# Patient Record
Sex: Female | Born: 1960 | Race: White | Hispanic: No | Smoking: Current every day smoker
Health system: Southern US, Community
[De-identification: ages and names within clinical notes are randomized; demographics above are authoritative.]

## PROBLEM LIST (undated history)

## (undated) DIAGNOSIS — M1990 Unspecified osteoarthritis, unspecified site: Secondary | ICD-10-CM

## (undated) DIAGNOSIS — R011 Cardiac murmur, unspecified: Secondary | ICD-10-CM

## (undated) DIAGNOSIS — I10 Essential (primary) hypertension: Secondary | ICD-10-CM

## (undated) DIAGNOSIS — N809 Endometriosis, unspecified: Secondary | ICD-10-CM

## (undated) HISTORY — PX: DIAGNOSTIC LAPAROSCOPY: SUR761

## (undated) HISTORY — DX: Endometriosis, unspecified: N80.9

## (undated) HISTORY — DX: Cardiac murmur, unspecified: R01.1

---

## 2001-07-13 ENCOUNTER — Ambulatory Visit (HOSPITAL_COMMUNITY): Admission: RE | Admit: 2001-07-13 | Discharge: 2001-07-13 | Payer: Self-pay | Admitting: Obstetrics and Gynecology

## 2001-07-13 ENCOUNTER — Encounter: Payer: Self-pay | Admitting: Obstetrics and Gynecology

## 2003-05-20 ENCOUNTER — Other Ambulatory Visit: Admission: RE | Admit: 2003-05-20 | Discharge: 2003-05-20 | Payer: Self-pay | Admitting: Obstetrics and Gynecology

## 2003-05-31 ENCOUNTER — Ambulatory Visit (HOSPITAL_COMMUNITY): Admission: RE | Admit: 2003-05-31 | Discharge: 2003-05-31 | Payer: Self-pay | Admitting: Obstetrics and Gynecology

## 2003-05-31 ENCOUNTER — Encounter: Payer: Self-pay | Admitting: Obstetrics and Gynecology

## 2004-06-09 ENCOUNTER — Ambulatory Visit (HOSPITAL_COMMUNITY): Admission: RE | Admit: 2004-06-09 | Discharge: 2004-06-09 | Payer: Self-pay | Admitting: Obstetrics and Gynecology

## 2008-09-13 ENCOUNTER — Other Ambulatory Visit: Admission: RE | Admit: 2008-09-13 | Discharge: 2008-09-13 | Payer: Self-pay | Admitting: Obstetrics & Gynecology

## 2008-09-20 ENCOUNTER — Ambulatory Visit (HOSPITAL_COMMUNITY): Admission: RE | Admit: 2008-09-20 | Discharge: 2008-09-20 | Payer: Self-pay | Admitting: Obstetrics & Gynecology

## 2011-05-14 NOTE — Op Note (Signed)
[facility]  Patient:    Angela Lin, Angela Lin                       MRN: [id number] Proc. Date: 07/13/01 Adm. Date:  [id number] Attending:  Sharon Mt                           Operative Report  PREOPERATIVE DIAGNOSES:  Dyspareunia, dysmenorrhea, and endometriosis suspected.  POSTOPERATIVE DIAGNOSES:  Dyspareunia, dysmenorrhea, endometriosis, pelvic adhesive disease, and right hydrosalpinx.  PROCEDURE PERFORMED:  Diagnostic laparoscopy with lysis of pelvic adhesions, laser vaporization of endometriosis and right salpingoneostomy.  SURGEON:  Daniel L. Eda Paschal, M.D.  ANESTHESIA:  General endotracheal.  INDICATIONS:  The patient is a 50 year old female who has presented to the office with dysmenorrhea and dyspareunia, and it was felt that she probably had endometriosis.  She enters the hospital now for diagnostic laparoscopy for both diagnosis and treatment.  FINDINGS:  At the time of laparoscopy, the patient had a mobile uterus.  She had areas of pigmented endometriosis and nonpigmented endometriosis on her uterosacral ligaments of about 2-3 cm.  Her left ovary was densely adherent to the broad ligament and where it was adherent there were areas of reddish endometriosis.  The patients left fallopian tube was slightly dilated, but it did have normal fimbria once it was freed up, and the tube was also patent when indigo carmine was introduced.  The right ovary had some superficial endometriosis, but no endometriomas, normal were there any endometriomas of the left ovary.  The patient had a very large right hydrosalpinx, however, when it was lasered especially over the areas that endometriosis near the distal portion, all tubal fluid including some endometriotic fluid drained and she did have some fimbria present on the right side.  DESCRIPTION OF PROCEDURE:  After adequate general endotracheal anesthesia, the patient was placed in the  dorsolithotomy position, prepped, and draped in the usual sterile manner, she was given IV Cefotetan.  Her bladder was emptied with a Roxan Hockey.  A Hulka catheter was inserted into the uterus.  A pneumoperitoneum was created with a Veress needle subumbilically, and a 10 mm trocar was placed.  Through that, the operating laparoscope was placed attached to the camera.  The pelvis was visualized and was described above. Pictures were taken for documentation.  A second 5 mm port was placed suprapubic through which an irrigator aspirator and several graspers were placed during the procedure.  First pelvic adhesive disease was lysed, both by blunt dissection with the accessory instruments and with the neodyminum laser. The neodymium laser was set at 12 watts of power.  A GR6 tip was placed over the bare tip.  Both adnexa could be completely freed up.  All the endometriosis described above could be laser vaporized.  Following this, the right tube was opened.  It was opened mostly to get rid of the endometriosis, which resolved the distal portion.  All the endometriosis was destroyed and the tube was entered.  Endometriotic fluid drained.  Once it was opened, there were some fimbria present.  Indigo carmine was introduced.  It did spill from the left tube.  It did not spill from the tube that had been opened, however, there was no proximal filling on the right and we could not ascertain for sure whether this was just preferential filling of the left tube or that the patient also had proximal obstruction on the right.  At the termination of the procedure, there was no endometriosis left.  Both adnexa were free.  All cul-de-sac fluid was removed.  Pneumoperitoneum was evacuated.  The subumbilical incision was closed with a 0 Vicryl through the fascia and the skin incisions were closed with 4-0 Monocryl.  Estimated blood loss for the entire procedure was less than 100 cc with none replaced.  The  patient tolerated the procedure well and left the operating room in satisfactory condition. DD:  07/13/01 TD:  07/14/01 Job: [id number]

## 2014-09-17 ENCOUNTER — Ambulatory Visit: Payer: BC Managed Care – PPO | Attending: Family Medicine | Admitting: Physical Therapy

## 2014-09-17 DIAGNOSIS — R5381 Other malaise: Secondary | ICD-10-CM | POA: Diagnosis not present

## 2014-09-17 DIAGNOSIS — M545 Low back pain, unspecified: Secondary | ICD-10-CM | POA: Insufficient documentation

## 2014-09-17 DIAGNOSIS — IMO0001 Reserved for inherently not codable concepts without codable children: Secondary | ICD-10-CM | POA: Diagnosis present

## 2014-09-17 DIAGNOSIS — M25559 Pain in unspecified hip: Secondary | ICD-10-CM | POA: Diagnosis not present

## 2014-09-17 DIAGNOSIS — M25569 Pain in unspecified knee: Secondary | ICD-10-CM | POA: Diagnosis not present

## 2014-09-25 ENCOUNTER — Ambulatory Visit: Payer: BC Managed Care – PPO | Admitting: Physical Therapy

## 2016-04-07 ENCOUNTER — Ambulatory Visit: Payer: BLUE CROSS/BLUE SHIELD | Attending: Physician Assistant

## 2016-04-07 DIAGNOSIS — M25651 Stiffness of right hip, not elsewhere classified: Secondary | ICD-10-CM | POA: Diagnosis present

## 2016-04-07 DIAGNOSIS — M25551 Pain in right hip: Secondary | ICD-10-CM | POA: Diagnosis present

## 2016-04-07 DIAGNOSIS — R262 Difficulty in walking, not elsewhere classified: Secondary | ICD-10-CM | POA: Diagnosis present

## 2016-04-07 DIAGNOSIS — R2689 Other abnormalities of gait and mobility: Secondary | ICD-10-CM | POA: Diagnosis present

## 2016-04-07 NOTE — Therapy (Signed)
[facility] [address], [place], [place], [address] Phone: [phone]   Fax:  [phone]  Physical Therapy Evaluation  Patient Details  Name: Angela Lin MRN: [id number] Date of Birth: 1961-01-29 Referring Provider: Suella Grove, PA  Encounter Date: 04/07/2016      PT End of Session - 04/07/16 0925    Visit Number 1   Date for PT Re-Evaluation 06/02/16   PT Start Time 0847   PT Stop Time 0926   PT Time Calculation (min) 39 min   Activity Tolerance Patient tolerated treatment well   Behavior During Therapy [facility] for tasks assessed/performed      History reviewed. No pertinent past medical history.  History reviewed. No pertinent past surgical history.  There were no vitals filed for this visit.       Subjective Assessment - 04/07/16 0853    Subjective Pt presents to PT with compliants of Rt hip pain (2 year history).  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  No recent incident or injury.     Pertinent History Predizone- 6 days with mild relief   Limitations Standing;Walking   How long can you stand comfortably? Stands Lt>Rt weightbearing.     How long can you walk comfortably? antalgic gait with gait.  <5 minutes max    Diagnostic tests nothing recent.  Old imaging showed bone on bone per pt report in Rt hip.   Patient Stated Goals reduce hip pain, normalize gait, return to part time job   Currently in Pain? Yes   Pain Score 7    Pain Location Hip   Pain Orientation Right   Pain Descriptors / Indicators Shooting   Pain Type Acute pain   Pain Onset 1 to 4 weeks ago   Pain Frequency Constant   Aggravating Factors  walking, standing.   Pain Relieving Factors rest, not walking, Yvonne Kendall            [facility] PT Assessment - 04/07/16 0001    Assessment   Medical Diagnosis Rt  hip pain   Referring Provider Suella Grove, PA   Onset Date/Surgical Date 03/24/16   Next MD Visit none.  Will see Dr Ninfa Linden when scheduled   Prior Therapy 2 years ago   Precautions   Precautions None   Restrictions   Weight Bearing Restrictions No   Balance Screen   Has the patient fallen in the past 6 months No   Has the patient had a decrease in activity level because of a fear of falling?  No   Is the patient reluctant to leave their home because of a fear of falling?  No   Home Environment   Living Environment Private residence   Type of [place] to enter   Entrance Stairs-Number of Steps 2   [place] One level   Prior Function   Level of Independence Independent   Vocation Full time employment   Vocation Requirements desk work- Photographer, part time work on Saturdays delivering flowers   Cognition   Overall Cognitive Status Within Functional Limits for tasks assessed   Observation/Other Assessments   Focus on Therapeutic Outcomes (FOTO)  70% limitation   ROM / Strength   AROM / PROM / Strength AROM;PROM;Strength   AROM   Overall AROM  Deficits  Overall AROM Comments Bil hip AROM limited by 50% in all directions   PROM   Overall PROM  Deficits   Overall PROM Comments Rt and Lt hip PROM limited by 50% in all directions   Strength   Overall Strength Deficits   Overall Strength Comments 5/5 Lt hip and knee strength   Strength Assessment Site Hip;Knee   Right/Left Hip Right   Right Hip Flexion 4-/5   Right Hip External Rotation  4/5   Right Hip Internal Rotation 4/5   Right Hip ABduction 4-/5   Right/Left Knee Right   Right Knee Flexion 4/5   Right Knee Extension 4/5   Palpation   Palpation comment Pt with tension and active trigger points in Rt hip flexor, piriformis and glut max.  No tenderness in quadratus.  Pelivs is level   Ambulation/Gait   Ambulation/Gait Yes   Ambulation/Gait Assistance 6: Modified independent (Device/Increase  time)   Ambulation Distance (Feet) 100 Feet   Gait Pattern Step-through pattern;Decreased hip/knee flexion - right;Decreased stance time - right;Antalgic                           PT Education - 04/07/16 0921    Education provided Yes   Education Details hip flexibilty   Person(s) Educated Patient   Methods Explanation;Demonstration;Handout   Comprehension Verbalized understanding;Returned demonstration          PT Short Term Goals - 04/07/16 0937    PT SHORT TERM GOAL #1   Title be independent in initial HEP   Time 4   Period Weeks   Status New   PT SHORT TERM GOAL #2   Title report < or = to 4/10 Rt hip pain with walking   Time 4   Period Weeks   Status New   PT SHORT TERM GOAL #3   Title walk for 10 minutes without limitation due to Rt hip pain   Time 4   Period Weeks   Status New           PT Long Term Goals - 04/07/16 0845    PT LONG TERM GOAL #1   Title be independent in advanced HEP   Time 8   Period Weeks   Status New   PT LONG TERM GOAL #2   Title reduce FOTO to < or = to 43% limitation   Time 8   Period Weeks   Status New   PT LONG TERM GOAL #3   Title demonstrate symmetry with ambulation on level surface   Time 8   Period Weeks   Status New   PT LONG TERM GOAL #4   Title report < or = to 3/10 Rt hip pain with standing and walking to allow for return to part time job   Time 8   Period Weeks   Status New   PT LONG TERM GOAL #5   Title walk for 30 minutes without limitation without rest to improve community function   Time 8   Period Weeks   Status New               Plan - 04/07/16 [id number]    Clinical Impression Statement Pt presents to PT with 2 week history of Rt hip pain with incident.  Pt demonstrates antalgic gait, stiffness in bil hips, active trigger points in Rt hip flexors and gluteals, weakness in Rt hip and knee.  FOTO score is 70% limitation.  Pt  will beneift from skilled PT for dry needling, flexibility,  soft tissue work, gait training and strength advancment to reduce pain and allow for normalized gait pattern and tolerance for walking.   Rehab Potential Good   PT Frequency 2x / week   PT Duration 8 weeks   PT Treatment/Interventions ADLs/Self Care Home Management;Cryotherapy;Electrical Stimulation;Moist Heat;Therapeutic exercise;Therapeutic activities;Manual techniques;Functional mobility training;Gait training;Ultrasound;Neuromuscular re-education;Patient/family education;Taping;Dry needling;Passive range of motion   PT Next Visit Plan Dry needling, hip flexibility, manual, hip strength, gait training   Consulted and Agree with Plan of Care Patient      Patient will benefit from skilled therapeutic intervention in order to improve the following deficits and impairments:  Abnormal gait, Decreased range of motion, Difficulty walking, Pain, Decreased strength, Impaired flexibility, Decreased activity tolerance  Visit Diagnosis: Pain in right hip - Plan: PT plan of care cert/re-cert  Other abnormalities of gait and mobility - Plan: PT plan of care cert/re-cert  Difficulty in walking, not elsewhere classified - Plan: PT plan of care cert/re-cert  Stiffness of right hip, not elsewhere classified - Plan: PT plan of care cert/re-cert     Problem List There are no active problems to display for this patient.  Sigurd Sos, PT 04/07/2016 10:14 AM  [facility] [address] [place], [place], [address] Phone: [phone]   Fax:  [phone]  Name: Angela Lin MRN: [id number] Date of Birth: 1961-08-15

## 2016-04-07 NOTE — Patient Instructions (Addendum)
Hold all stretches for 20 seconds and do 3 on each side.  3x/day.   Hip Stretch  Put right ankle over left knee. Let right knee fall downward, but keep ankle in place. Feel the stretch in hip. May push down gently with hand to feel stretch. Hold ____ seconds while counting out loud. Repeat with other leg. Repeat ____ times. Do ____ sessions per day.  Stretching: Piriformis (Supine)  Pull right knee toward opposite shoulder. Hold ____ seconds. Relax. Repeat ____ times per set. Do ____ sets per session. Do ____ sessions per day.  Knee to Chest    Lying supine, bend involved knee to chest ___ times. Repeat with other leg. Do ___ times per day.  Copyright  VHI. All rights reserved.   HIP: Hamstrings - Short Sitting    Rest leg on raised surface. Keep knee straight. Lift chest. Hold ___ seconds. ___ reps per set, ___ sets per day, ___ days per week  Copyright  VHI. All rights reserved.  Hip Flexor Stretch    Lying on back near edge of bed, bend one leg, foot flat. Hang other leg over edge, relaxed, thigh resting entirely on bed for ____ minutes. Repeat ____ times. Do ____ sessions per day. Advanced Exercise: Bend knee back keeping thigh in contact with bed.  http://gt2.exer.us/347   Copyright  VHI. All rights reserved.   Kevin [address] [place], [place] [address] Phone # [phone] Fax [phone]

## 2016-04-12 ENCOUNTER — Ambulatory Visit: Payer: BLUE CROSS/BLUE SHIELD

## 2016-04-12 DIAGNOSIS — M25651 Stiffness of right hip, not elsewhere classified: Secondary | ICD-10-CM

## 2016-04-12 DIAGNOSIS — R2689 Other abnormalities of gait and mobility: Secondary | ICD-10-CM

## 2016-04-12 DIAGNOSIS — R262 Difficulty in walking, not elsewhere classified: Secondary | ICD-10-CM

## 2016-04-12 DIAGNOSIS — M25551 Pain in right hip: Secondary | ICD-10-CM | POA: Diagnosis not present

## 2016-04-12 NOTE — Patient Instructions (Signed)

## 2016-04-12 NOTE — Therapy (Signed)
[facility] [address], [place], [place], [address] Phone: [phone]   Fax:  [phone]  Physical Therapy Treatment  Patient Details  Name: Angela Lin MRN: [id number] Date of Birth: [birth year] Referring Provider: Suella Grove, PA  Encounter Date: 04/12/2016      PT End of Session - 04/12/16 0841    Visit Number 2   Date for PT Re-Evaluation 06/02/16   PT Start Time 0800   PT Stop Time 0855   PT Time Calculation (min) 55 min   Activity Tolerance Patient tolerated treatment well   Behavior During Therapy [facility] for tasks assessed/performed      History reviewed. No pertinent past medical history.  History reviewed. No pertinent past surgical history.  There were no vitals filed for this visit.      Subjective Assessment - 04/12/16 0802    Subjective Pt has beend doing stretches.  Rt hip feels a little bit better.     Currently in Pain? Yes   Pain Score 3    Pain Location Hip   Pain Orientation Right   Pain Descriptors / Indicators Shooting   Pain Type Acute pain   Pain Onset 1 to 4 weeks ago   Pain Frequency Constant   Aggravating Factors  walking, standing   Pain Relieving Factors rest, not walking, Yvonne Kendall                         [facility] Adult PT Treatment/Exercise - 04/12/16 0001    Exercises   Exercises Knee/Hip;Lumbar   Lumbar Exercises: Stretches   Active Hamstring Stretch 3 reps;20 seconds   Single Knee to Chest Stretch 3 reps;20 seconds   Single Knee to Chest Stretch Limitations diagonal knee to chest 3x20 seconds   Piriformis Stretch 3 reps;20 seconds   Modalities   Modalities Moist Heat   Moist Heat Therapy   Number Minutes Moist Heat 15 Minutes   Moist Heat Location Hip  Rt in sidelying   Manual Therapy   Manual Therapy Soft tissue mobilization;Myofascial release;Passive ROM   Manual therapy comments PROM into Rt hip flexion, IR and ER and soft  tissue release to Rt piriformis          Trigger Point Dry Needling - 04/12/16 0806    Consent Given? Yes   Education Handout Provided Yes   Muscles Treated Lower Body Quadriceps;Piriformis;Gluteus maximus   Gluteus Maximus Response Twitch response elicited;Palpable increased muscle length   Piriformis Response Twitch response elicited;Palpable increased muscle length   Quadriceps Response Palpable increased muscle length              PT Education - 04/12/16 0826    Education provided Yes   Education Details DN information   Person(s) Educated Patient   Methods Explanation;Handout   Comprehension Verbalized understanding;Returned demonstration          PT Short Term Goals - 04/12/16 0800    PT SHORT TERM GOAL #1   Title be independent in initial HEP   Time 4   Period Weeks   Status On-going   PT SHORT TERM GOAL #2   Title report < or = to 4/10 Rt hip pain with walking   Time 4   Period Weeks   Status On-going   PT SHORT TERM GOAL #3   Title walk for 10 minutes without limitation due to Rt hip pain   Time 4   Period Weeks  Status On-going           PT Long Term Goals - 04/07/16 0845    PT LONG TERM GOAL #1   Title be independent in advanced HEP   Time 8   Period Weeks   Status New   PT LONG TERM GOAL #2   Title reduce FOTO to < or = to 43% limitation   Time 8   Period Weeks   Status New   PT LONG TERM GOAL #3   Title demonstrate symmetry with ambulation on level surface   Time 8   Period Weeks   Status New   PT LONG TERM GOAL #4   Title report < or = to 3/10 Rt hip pain with standing and walking to allow for return to part time job   Time 8   Period Weeks   Status New   PT LONG TERM GOAL #5   Title walk for 30 minutes without limitation without rest to improve community function   Time 8   Period Weeks   Status New               Plan - 04/12/16 0802    Clinical Impression Statement Pt with continued antalgic gait and Rt hip  pain that limits standing activity.  Pt has been doing exercises for flexibility.  Pt with active trigger points in Rt hip flexors and gluteals and weakness in Rt hip and knee.  Pt responded well to dry needling with incresaed tissue length after session.  Pt will benefit from skilled PT for strength, flexibility and manual and modalities.     Rehab Potential Good   PT Frequency 2x / week   PT Duration 8 weeks   PT Treatment/Interventions ADLs/Self Care Home Management;Cryotherapy;Electrical Stimulation;Moist Heat;Therapeutic exercise;Therapeutic activities;Manual techniques;Functional mobility training;Gait training;Ultrasound;Neuromuscular re-education;Patient/family education;Taping;Dry needling;Passive range of motion   PT Next Visit Plan assess response to Dry needling, hip flexibility, manual, hip strength, gait training   Consulted and Agree with Plan of Care Patient      Patient will benefit from skilled therapeutic intervention in order to improve the following deficits and impairments:  Abnormal gait, Decreased range of motion, Difficulty walking, Pain, Decreased strength, Impaired flexibility, Decreased activity tolerance  Visit Diagnosis: Pain in right hip  Other abnormalities of gait and mobility  Difficulty in walking, not elsewhere classified  Stiffness of right hip, not elsewhere classified     Problem List There are no active problems to display for this patient.  Sigurd Sos, PT 04/12/2016 8:46 AM  [facility] [address], [place], [place], [address] Phone: [phone]   Fax:  [phone]  Name: NATORIA WEATHERLEY MRN: [id number] Date of Birth: 1961-05-28

## 2016-04-14 ENCOUNTER — Encounter: Payer: Self-pay | Admitting: Physical Therapy

## 2016-04-14 ENCOUNTER — Ambulatory Visit: Payer: BLUE CROSS/BLUE SHIELD | Admitting: Physical Therapy

## 2016-04-14 DIAGNOSIS — R262 Difficulty in walking, not elsewhere classified: Secondary | ICD-10-CM

## 2016-04-14 DIAGNOSIS — R2689 Other abnormalities of gait and mobility: Secondary | ICD-10-CM

## 2016-04-14 DIAGNOSIS — M25651 Stiffness of right hip, not elsewhere classified: Secondary | ICD-10-CM

## 2016-04-14 DIAGNOSIS — M25551 Pain in right hip: Secondary | ICD-10-CM | POA: Diagnosis not present

## 2016-04-14 NOTE — Therapy (Signed)
[facility] [address], [place], [place], [address] Phone: [phone]   Fax:  [phone]  Physical Therapy Treatment  Patient Details  Name: Angela Lin MRN: [id number] Date of Birth: 1961/10/13 Referring Provider: Suella Grove, PA  Encounter Date: 04/14/2016      PT End of Session - 04/14/16 0816    Visit Number 3   Date for PT Re-Evaluation 06/02/16   PT Start Time 0800   PT Stop Time 0856   PT Time Calculation (min) 56 min   Activity Tolerance Patient tolerated treatment well   Behavior During Therapy [facility] for tasks assessed/performed      History reviewed. No pertinent past medical history.  History reviewed. No pertinent past surgical history.  There were no vitals filed for this visit.      Subjective Assessment - 04/14/16 0810    Subjective Pt rates her pain as 5/10 this am, legs are fatique with her walk yesterday night. Pt with limbing gait due to pain in Rt hip    Pertinent History Predizone- 6 days with mild relief   Limitations Standing;Walking   How long can you stand comfortably? Stands Lt>Rt weightbearing.     How long can you walk comfortably? antalgic gait with gait.  <5 minutes max    Diagnostic tests nothing recent.  Old imaging showed bone on bone per pt report in Rt hip.   Patient Stated Goals reduce hip pain, normalize gait, return to part time job   Currently in Pain? Yes   Pain Score 5    Pain Location Hip   Pain Orientation Right   Pain Descriptors / Indicators Tightness   Pain Type Acute pain   Pain Onset 1 to 4 weeks ago   Pain Frequency Constant   Aggravating Factors  walking, standing   Pain Relieving Factors rest, not walking, [place], [employer]   Multiple Pain Sites No                         [facility] Adult PT Treatment/Exercise - 04/14/16 0001    Exercises   Exercises Knee/Hip;Lumbar   Lumbar Exercises: Stretches   Active Hamstring Stretch 3  reps;20 seconds   Single Knee to Chest Stretch 3 reps;20 seconds   Single Knee to Chest Stretch Limitations diagonal knee to chest 3x20 seconds   Piriformis Stretch 3 reps;20 seconds   Modalities   Modalities Moist Heat   Moist Heat Therapy   Number Minutes Moist Heat 15 Minutes   Moist Heat Location Hip  Rt in left sidelying   Manual Therapy   Manual Therapy Soft tissue mobilization;Myofascial release;Passive ROM;Joint mobilization   Manual therapy comments PROM into Rt hip flexion, IR and ER and soft tissue release to Rt piriformis   Joint Mobilization caudal & lat glide grade 3 Rt hip                  PT Short Term Goals - 04/14/16 [id number]    PT SHORT TERM GOAL #1   Title be independent in initial HEP   Time 4   Period Weeks   Status On-going   PT SHORT TERM GOAL #2   Title report < or = to 4/10 Rt hip pain with walking   Time 4   Period Weeks   Status On-going   PT SHORT TERM GOAL #3   Title walk for 10 minutes without limitation due to Rt hip pain  Time 4   Period Weeks   Status On-going           PT Long Term Goals - 04/07/16 0845    PT LONG TERM GOAL #1   Title be independent in advanced HEP   Time 8   Period Weeks   Status New   PT LONG TERM GOAL #2   Title reduce FOTO to < or = to 43% limitation   Time 8   Period Weeks   Status New   PT LONG TERM GOAL #3   Title demonstrate symmetry with ambulation on level surface   Time 8   Period Weeks   Status New   PT LONG TERM GOAL #4   Title report < or = to 3/10 Rt hip pain with standing and walking to allow for return to part time job   Time 8   Period Weeks   Status New   PT LONG TERM GOAL #5   Title walk for 30 minutes without limitation without rest to improve community function   Time 8   Period Weeks   Status New               Plan - 04/14/16 [id number]    Clinical Impression Statement Pt with antalgic gait due to Rt hip pain. Pt with palpaple tenderness and TP iliopsoas, gluteus and  piriiformis in Rt hip. Pt with overall weakness in Rt gluteal and leg. Pt will continue to benefit from skilled PT for strength, flexibility, manual and modalities      Rehab Potential Good   PT Frequency 2x / week   PT Duration 8 weeks   PT Treatment/Interventions ADLs/Self Care Home Management;Cryotherapy;Electrical Stimulation;Moist Heat;Therapeutic exercise;Therapeutic activities;Manual techniques;Functional mobility training;Gait training;Ultrasound;Neuromuscular re-education;Patient/family education;Taping;Dry needling;Passive range of motion   PT Next Visit Plan Dry needeling, hip flexibility, manual, hip strength and modalities   Consulted and Agree with Plan of Care Patient      Patient will benefit from skilled therapeutic intervention in order to improve the following deficits and impairments:  Abnormal gait, Decreased range of motion, Difficulty walking, Pain, Decreased strength, Impaired flexibility, Decreased activity tolerance  Visit Diagnosis: Pain in right hip  Other abnormalities of gait and mobility  Difficulty in walking, not elsewhere classified  Stiffness of right hip, not elsewhere classified     Problem List There are no active problems to display for this patient.   Dorwin Fitzhenry Naumann-Houegnifio, PTA 04/14/2016 9:00 AM  [facility] [address] [place], [place], [address] Phone: [phone]   Fax:  [phone]  Name: Angela Lin MRN: [id number] Date of Birth: 10/24/1961

## 2016-04-20 ENCOUNTER — Ambulatory Visit: Payer: BLUE CROSS/BLUE SHIELD

## 2016-04-20 DIAGNOSIS — R262 Difficulty in walking, not elsewhere classified: Secondary | ICD-10-CM

## 2016-04-20 DIAGNOSIS — R2689 Other abnormalities of gait and mobility: Secondary | ICD-10-CM

## 2016-04-20 DIAGNOSIS — M25651 Stiffness of right hip, not elsewhere classified: Secondary | ICD-10-CM

## 2016-04-20 DIAGNOSIS — M25551 Pain in right hip: Secondary | ICD-10-CM | POA: Diagnosis not present

## 2016-04-20 NOTE — Therapy (Signed)
[facility] [address] [place], [place], [address] Phone: [phone]   Fax:  [phone]  Physical Therapy Treatment  Patient Details  Name: Angela Lin MRN: [id number] Date of Birth: 01/05/1961 Referring Provider: Suella Grove, PA  Encounter Date: 04/20/2016      PT End of Session - 04/20/16 0759    Visit Number 4   Date for PT Re-Evaluation 06/02/16   PT Start Time 0725   PT Stop Time 0815   PT Time Calculation (min) 50 min   Activity Tolerance Patient tolerated treatment well   Behavior During Therapy [facility] for tasks assessed/performed      No past medical history on file.  No past surgical history on file.  There were no vitals filed for this visit.      Subjective Assessment - 04/20/16 0728    Subjective Pt has stopped taking the muscle relaxers.  Gait remains antalgic.     Patient Stated Goals reduce hip pain, normalize gait, return to part time job   Pain Score 5    Pain Location Hip   Pain Orientation Right   Pain Descriptors / Indicators Tightness   Pain Type Acute pain   Pain Onset 1 to 4 weeks ago   Pain Frequency Constant   Aggravating Factors  walking, standing   Pain Relieving Factors rest, not walking, [employer]                         [facility] Adult PT Treatment/Exercise - 04/20/16 0001    Modalities   Modalities Moist Heat   Moist Heat Therapy   Number Minutes Moist Heat 15 Minutes   Moist Heat Location Hip  Rt in left sidelying   Manual Therapy   Manual Therapy Soft tissue mobilization;Myofascial release;Passive ROM;Joint mobilization   Manual therapy comments PROM into Rt hip flexion, IR and ER and soft tissue release to Rt piriformis   Myofascial Release trigger point release to Rt quads and gluteals with deep tissue elongation          Trigger Point Dry Needling - 04/20/16 0757    Consent Given? Yes   Muscles Treated Lower Body Gluteus  maximus;Piriformis;Quadriceps   Gluteus Maximus Response Twitch response elicited;Palpable increased muscle length   Piriformis Response Twitch response elicited;Palpable increased muscle length   Quadriceps Response Twitch response elicited;Palpable increased muscle length                PT Short Term Goals - 04/20/16 0729    PT SHORT TERM GOAL #1   Title be independent in initial HEP   Status Achieved   PT SHORT TERM GOAL #2   Title report < or = to 4/10 Rt hip pain with walking   Time 4   Period Weeks   Status On-going   PT SHORT TERM GOAL #3   Title walk for 10 minutes without limitation due to Rt hip pain   Status Achieved           PT Long Term Goals - 04/07/16 0845    PT LONG TERM GOAL #1   Title be independent in advanced HEP   Time 8   Period Weeks   Status New   PT LONG TERM GOAL #2   Title reduce FOTO to < or = to 43% limitation   Time 8   Period Weeks   Status New   PT LONG TERM GOAL #3  Title demonstrate symmetry with ambulation on level surface   Time 8   Period Weeks   Status New   PT LONG TERM GOAL #4   Title report < or = to 3/10 Rt hip pain with standing and walking to allow for return to part time job   Time 8   Period Weeks   Status New   PT LONG TERM GOAL #5   Title walk for 30 minutes without limitation without rest to improve community function   Time 8   Period Weeks   Status New               Plan - 04/20/16 0730    Clinical Impression Statement Pt now able to walk for 15 minutes with her dog but gait remains antalgic.  Pt with active trigger points in Rt gluteals and had increaed tissue length and reduced trigger points after treatment today.  Emphasis on flexibility today and manual to imrpove tissue length.  Pt will continue to benefit from skilled PTfor flexibility, gait training and manual to improve Rt hip flexibilty and reduce trigger points.     Rehab Potential Good   PT Frequency 2x / week   PT Duration 8  weeks   PT Treatment/Interventions ADLs/Self Care Home Management;Cryotherapy;Electrical Stimulation;Moist Heat;Therapeutic exercise;Therapeutic activities;Manual techniques;Functional mobility training;Gait training;Ultrasound;Neuromuscular re-education;Patient/family education;Taping;Dry needling;Passive range of motion   PT Next Visit Plan assess response to DN2, manual to Rt hip, gait training, modalities and soft tissue work   [employer] and Agree with Plan of Care Patient      Patient will benefit from skilled therapeutic intervention in order to improve the following deficits and impairments:  Abnormal gait, Decreased range of motion, Difficulty walking, Pain, Decreased strength, Impaired flexibility, Decreased activity tolerance  Visit Diagnosis: Pain in right hip  Other abnormalities of gait and mobility  Difficulty in walking, not elsewhere classified  Stiffness of right hip, not elsewhere classified     Problem List There are no active problems to display for this patient.   Sigurd Sos, PT 04/20/2016 8:00 AM  [facility] [address] [place], [place], [address] Phone: [phone]   Fax:  [phone]  Name: Angela Lin MRN: [id number] Date of Birth: 1961-09-12

## 2016-04-22 ENCOUNTER — Ambulatory Visit: Payer: BLUE CROSS/BLUE SHIELD

## 2016-04-22 DIAGNOSIS — R2689 Other abnormalities of gait and mobility: Secondary | ICD-10-CM

## 2016-04-22 DIAGNOSIS — M25551 Pain in right hip: Secondary | ICD-10-CM | POA: Diagnosis not present

## 2016-04-22 DIAGNOSIS — M25651 Stiffness of right hip, not elsewhere classified: Secondary | ICD-10-CM

## 2016-04-22 DIAGNOSIS — R262 Difficulty in walking, not elsewhere classified: Secondary | ICD-10-CM

## 2016-04-22 NOTE — Therapy (Signed)
[facility] [address], [place], [place], [address] Phone: [phone]   Fax:  [phone]  Physical Therapy Treatment  Patient Details  Name: Angela Lin MRN: [id number] Date of Birth: 13-Apr-1961 Referring Provider: Suella Grove, PA  Encounter Date: 04/22/2016      PT End of Session - 04/22/16 0803    Visit Number 5   Date for PT Re-Evaluation 06/02/16   PT Start Time 0738  Pt late for appt   PT Stop Time 0823   PT Time Calculation (min) 45 min   Activity Tolerance Patient tolerated treatment well   Behavior During Therapy [facility] for tasks assessed/performed      History reviewed. No pertinent past medical history.  History reviewed. No pertinent past surgical history.  There were no vitals filed for this visit.      Subjective Assessment - 04/22/16 0739    Subjective Pt reports increased pain due to yardwork yesterday.  Gait is antalgic.     Currently in Pain? Yes   Pain Score 5    Pain Location Hip   Pain Orientation Right   Pain Descriptors / Indicators Tightness   Pain Type Acute pain   Pain Onset 1 to 4 weeks ago   Pain Frequency Constant   Aggravating Factors  walking, standing   Pain Relieving Factors rest, not walking, [employer]                         [facility] Adult PT Treatment/Exercise - 04/22/16 0001    Ambulation/Gait   Ambulation/Gait Yes   Ambulation/Gait Assistance 6: Modified independent (Device/Increase time)   Ambulation Distance (Feet) --  4x50 feet   Assistive device Straight cane   Gait Pattern Step-through pattern;Decreased step length - right;Decreased stance time - left   Gait Comments Gait training using mirror for visual feedback.  Emphasis on equal step length, time in stance and symmetry to normalize gait pattern   Lumbar Exercises: Stretches   Active Hamstring Stretch 3 reps;20 seconds  using step   Single Knee to Chest Stretch 3 reps;20  seconds   Single Knee to Chest Stretch Limitations diagonal knee to chest 3x20 seconds   Piriformis Stretch 3 reps;20 seconds  seated figure 4   Lumbar Exercises: Aerobic   Stationary Bike Level 2x 5 minutes   Knee/Hip Exercises: Standing   Rebounder weight shifting 3 ways with emphasis on Rt weightbearing x 1 minute each   Modalities   Modalities Moist Heat   Moist Heat Therapy   Number Minutes Moist Heat 15 Minutes   Moist Heat Location Hip  Lt sidelying                  PT Short Term Goals - 04/20/16 0729    PT SHORT TERM GOAL #1   Title be independent in initial HEP   Status Achieved   PT SHORT TERM GOAL #2   Title report < or = to 4/10 Rt hip pain with walking   Time 4   Period Weeks   Status On-going   PT SHORT TERM GOAL #3   Title walk for 10 minutes without limitation due to Rt hip pain   Status Achieved           PT Long Term Goals - 04/07/16 0845    PT LONG TERM GOAL #1   Title be independent in advanced HEP   Time 8   Period Weeks  Status New   PT LONG TERM GOAL #2   Title reduce FOTO to < or = to 43% limitation   Time 8   Period Weeks   Status New   PT LONG TERM GOAL #3   Title demonstrate symmetry with ambulation on level surface   Time 8   Period Weeks   Status New   PT LONG TERM GOAL #4   Title report < or = to 3/10 Rt hip pain with standing and walking to allow for return to part time job   Time 8   Period Weeks   Status New   PT LONG TERM GOAL #5   Title walk for 30 minutes without limitation without rest to improve community function   Time 8   Period Weeks   Status New               Plan - 04/22/16 0741    Clinical Impression Statement Pt is able to walk for 15 minutes with her dog yet gait remains antalgic.  Pt with active trigger points in Rt gluteals and this is reduced in since since evaluation.  Emphasis on flexibility and symmetry with gait today.  Pt will continue to benefit from skilled PT for flexibility,  gait training and manual to imrpove Rt hip flexibility and reduce trigger points.     Rehab Potential Good   PT Frequency 2x / week   PT Duration 8 weeks   PT Treatment/Interventions ADLs/Self Care Home Management;Cryotherapy;Electrical Stimulation;Moist Heat;Therapeutic exercise;Therapeutic activities;Manual techniques;Functional mobility training;Gait training;Ultrasound;Neuromuscular re-education;Patient/family education;Taping;Dry needling;Passive range of motion   PT Next Visit Plan DN to Rt gluteals and quads, flexibilty, gait training, manual therapy   Consulted and Agree with Plan of Care Patient      Patient will benefit from skilled therapeutic intervention in order to improve the following deficits and impairments:  Abnormal gait, Decreased range of motion, Difficulty walking, Pain, Decreased strength, Impaired flexibility, Decreased activity tolerance  Visit Diagnosis: Pain in right hip  Other abnormalities of gait and mobility  Difficulty in walking, not elsewhere classified  Stiffness of right hip, not elsewhere classified     Problem List There are no active problems to display for this patient.   Sigurd Sos, PT 04/22/2016 8:08 AM  [facility] [address], [place], [place], [address] Phone: [phone]   Fax:  [phone]  Name: MCKENLY BENCOMO MRN: [id number] Date of Birth: [birth year]

## 2016-04-27 ENCOUNTER — Ambulatory Visit: Payer: BLUE CROSS/BLUE SHIELD | Attending: Physician Assistant

## 2016-04-27 DIAGNOSIS — M25551 Pain in right hip: Secondary | ICD-10-CM | POA: Insufficient documentation

## 2016-04-27 DIAGNOSIS — M25651 Stiffness of right hip, not elsewhere classified: Secondary | ICD-10-CM | POA: Insufficient documentation

## 2016-04-27 DIAGNOSIS — R2689 Other abnormalities of gait and mobility: Secondary | ICD-10-CM | POA: Insufficient documentation

## 2016-04-27 NOTE — Therapy (Signed)
[facility] [address], [place], [place], [address] Phone: [phone]   Fax:  [phone]  Physical Therapy Treatment  Patient Details  Name: Angela Lin MRN: [id number] Date of Birth: 04-28-1961 Referring Provider: Suella Grove, PA  Encounter Date: 04/27/2016      PT End of Session - 04/27/16 0813    Visit Number 6   Date for PT Re-Evaluation 06/02/16   PT Start Time 0730   PT Stop Time 0830   PT Time Calculation (min) 60 min   Activity Tolerance Patient tolerated treatment well   Behavior During Therapy [facility] for tasks assessed/performed      History reviewed. No pertinent past medical history.  History reviewed. No pertinent past surgical history.  There were no vitals filed for this visit.      Subjective Assessment - 04/27/16 0733    Subjective Rt hip is feeling better.  Pt with antalgic gait-didn't buy a cane   Currently in Pain? Yes   Pain Score 2    Pain Location Hip   Pain Orientation Right   Pain Descriptors / Indicators Tightness   Pain Type Acute pain   Pain Onset 1 to 4 weeks ago   Pain Frequency Constant   Aggravating Factors  walking, standing   Pain Relieving Factors rest, not walking,                         [facility] Adult PT Treatment/Exercise - 04/27/16 0001    Ambulation/Gait   Ambulation/Gait Yes   Ambulation/Gait Assistance 6: Modified independent (Device/Increase time)   Ambulation Distance (Feet) --  4x50 feet   Assistive device Straight cane   Gait Pattern Step-through pattern;Decreased step length - right;Decreased stance time - left   Gait Comments Gait training using mirror for visual feedback.  Emphasis on equal step length, time in stance and symmetry to normalize gait pattern   Exercises   Exercises Knee/Hip   Lumbar Exercises: Stretches   Single Knee to Chest Stretch 3 reps;20 seconds   Single Knee to Chest Stretch Limitations diagonal knee  to chest 3x20 seconds   Lumbar Exercises: Aerobic   Stationary Bike Level 2x 5 minutes   Knee/Hip Exercises: Standing   Rebounder weight shifting 3 ways with emphasis on Rt weightbearing x 1 minute each   Modalities   Modalities Moist Heat   Moist Heat Therapy   Number Minutes Moist Heat 15 Minutes   Moist Heat Location Hip  Lt sidelying   Manual Therapy   Manual Therapy Soft tissue mobilization   Soft tissue mobilization soft tissue elongation and trigger point release to Rt gluteals          Trigger Point Dry Needling - 04/27/16 0735    Consent Given? Yes   Muscles Treated Lower Body Gluteus maximus;Piriformis;Gluteus minimus   Gluteus Minimus Response Twitch response elicited;Palpable increased muscle length   Piriformis Response Twitch response elicited;Palpable increased muscle length   Quadriceps Response Twitch response elicited;Palpable increased muscle length                PT Short Term Goals - 04/27/16 0735    PT SHORT TERM GOAL #2   Title report < or = to 4/10 Rt hip pain with walking   Status Achieved   PT SHORT TERM GOAL #3   Title walk for 10 minutes without limitation due to Rt hip pain   Status Achieved  PT Long Term Goals - 04/27/16 0734    PT LONG TERM GOAL #1   Title be independent in advanced HEP   Time 8   Period Weeks   Status On-going   PT LONG TERM GOAL #3   Title demonstrate symmetry with ambulation on level surface   Time 8   Period Weeks   Status On-going   PT LONG TERM GOAL #4   Title report < or = to 3/10 Rt hip pain with standing and walking to allow for return to part time job   Status Achieved   PT LONG TERM GOAL #5   Title walk for 30 minutes without limitation without rest to improve community function   Time 8   Period Weeks   Status On-going  15-20 minutes               Plan - 04/27/16 0737    Clinical Impression Statement Pt with significant reduction in Rt gluteal pain and rates is 2/10  average today.  Pt demonstrates antalgic gait with all gait.  Pt is able to normalize gait pattern with training, use of cane and visual feedback with mirror.  Walking is limited to 15-20 minutes due to reports of fatigue and weakness.  Pt will see orthopedic MD next week.  Pt will continue to benefit from skilled PT for Rt hip flexiblity, trigger point release and strength.     Rehab Potential Good   PT Frequency 2x / week   PT Duration 8 weeks   PT Treatment/Interventions ADLs/Self Care Home Management;Cryotherapy;Electrical Stimulation;Moist Heat;Therapeutic exercise;Therapeutic activities;Manual techniques;Functional mobility training;Gait training;Ultrasound;Neuromuscular re-education;Patient/family education;Taping;Dry needling;Passive range of motion   PT Next Visit Plan gait training, flexibilty, gait training, manual therapy   Consulted and Agree with Plan of Care Patient      Patient will benefit from skilled therapeutic intervention in order to improve the following deficits and impairments:  Abnormal gait, Decreased range of motion, Difficulty walking, Pain, Decreased strength, Impaired flexibility, Decreased activity tolerance  Visit Diagnosis: Pain in right hip  Other abnormalities of gait and mobility  Stiffness of right hip, not elsewhere classified     Problem List There are no active problems to display for this patient.  Angela Lin, PT 04/27/2016 8:18 AM  [facility] [address], [place], [place], [address] Phone: [phone]   Fax:  [phone]  Name: Angela Lin MRN: [id number] Date of Birth: 14-Jan-1961

## 2016-04-29 ENCOUNTER — Ambulatory Visit: Payer: BLUE CROSS/BLUE SHIELD

## 2016-04-29 DIAGNOSIS — M25651 Stiffness of right hip, not elsewhere classified: Secondary | ICD-10-CM

## 2016-04-29 DIAGNOSIS — M25551 Pain in right hip: Secondary | ICD-10-CM | POA: Diagnosis not present

## 2016-04-29 DIAGNOSIS — R2689 Other abnormalities of gait and mobility: Secondary | ICD-10-CM

## 2016-04-29 NOTE — Patient Instructions (Signed)
Anterior Step-Up    Stand with ___ inch step placed in A direction. Step onto step with right foot without pushing off with the ground foot. Finish with ground foot in touch balance on step and return _10__ times. __2_ sets __1-2_ times per day.  http://gglj.exer.us/161   Copyright  VHI. All rights reserved.  Bridge    Lie back, legs bent. Inhale, pressing hips up. Keeping ribs in, lengthen lower back. Exhale, rolling down along spine from top.  Hold 5 seconds Repeat _10___ times. Do __2__ sessions per day.  http://pm.exer.us/55   Copyright  VHI. All rights reserved.   [facility] [address], [place], [place] [address] Phone # [phone] Fax [phone]

## 2016-04-29 NOTE — Therapy (Signed)
[facility] [address], [place], [place], [address] Phone: [phone]   Fax:  [phone]  Physical Therapy Treatment  Patient Details  Name: Angela Lin MRN: [id number] Date of Birth: 07/18/1961 Referring Provider: Suella Grove, PA  Encounter Date: 04/29/2016      PT End of Session - 04/29/16 0756    Visit Number 7   Date for PT Re-Evaluation 06/02/16   PT Start Time 0731   PT Stop Time 0813   PT Time Calculation (min) 42 min   Activity Tolerance Patient tolerated treatment well   Behavior During Therapy [facility] for tasks assessed/performed      History reviewed. No pertinent past medical history.  History reviewed. No pertinent past surgical history.  There were no vitals filed for this visit.      Subjective Assessment - 04/29/16 0737    Subjective Pt reports that she was able walk longer with dog.  20-30 minutes.   Patient Stated Goals reduce hip pain, normalize gait, return to part time job   Currently in Pain? Yes   Pain Score 2    Pain Location Hip   Pain Orientation Right   Pain Descriptors / Indicators Spasm;Tightness   Pain Type Acute pain   Pain Onset 1 to 4 weeks ago   Pain Frequency Constant                         [facility] Adult PT Treatment/Exercise - 04/29/16 0001    Lumbar Exercises: Stretches   Single Knee to Chest Stretch 3 reps;20 seconds   Single Knee to Chest Stretch Limitations diagonal knee to chest 3x20 seconds   Lumbar Exercises: Aerobic   Tread Mill 1.3 to 1.7 mph x 5 minutes  use of mirror and verbal cueing for symmetry   Knee/Hip Exercises: Stretches   Piriformis Stretch Right;2 reps;20 seconds  difficult to get into position   Knee/Hip Exercises: Standing   Forward Step Up Right;2 sets;10 reps;Hand Hold: 1   Knee/Hip Exercises: Supine   Bridges with Ball Squeeze Strengthening;Both;2 sets;10 reps   Bridges with Clamshell Both;2 sets;10 reps   Moist Heat Therapy   Number Minutes Moist Heat 10 Minutes   Moist Heat Location Hip  Lt sidelying                PT Education - 04/29/16 0754    Education provided Yes   Education Details HEP: bridge and step up   Person(s) Educated Patient   Methods Demonstration;Explanation;Handout   Comprehension Verbalized understanding;Returned demonstration          PT Short Term Goals - 04/27/16 0735    PT SHORT TERM GOAL #2   Title report < or = to 4/10 Rt hip pain with walking   Status Achieved   PT SHORT TERM GOAL #3   Title walk for 10 minutes without limitation due to Rt hip pain   Status Achieved           PT Long Term Goals - 04/27/16 0734    PT LONG TERM GOAL #1   Title be independent in advanced HEP   Time 8   Period Weeks   Status On-going   PT LONG TERM GOAL #3   Title demonstrate symmetry with ambulation on level surface   Time 8   Period Weeks   Status On-going   PT LONG TERM GOAL #4   Title report < or = to  3/10 Rt hip pain with standing and walking to allow for return to part time job   Status Achieved   PT LONG TERM GOAL #5   Title walk for 30 minutes without limitation without rest to improve community function   Time 8   Period Weeks   Status On-going  15-20 minutes               Plan - 04/29/16 [id number]    Clinical Impression Statement Pt with significant reduction in Rt gluteal pain and rates it 2/10 today.  Pt with mild antalgia and symmetry is improving.  PT used visual cueing on treadmill with mirror today. Pt walked with dog for 20-30 minutes over the past 2 weeks.  Pt with reduced muscle spasm in the Rt glutelas since the start of care.  Pt will see orthopedic MD next week.  Pt will continue to beneift from skilled PT for Rt hip hip flexibility, trigger point release and strength.     Rehab Potential Good   PT Frequency 2x / week   PT Duration 8 weeks   PT Treatment/Interventions ADLs/Self Care Home Management;Cryotherapy;Electrical  Stimulation;Moist Heat;Therapeutic exercise;Therapeutic activities;Manual techniques;Functional mobility training;Gait training;Ultrasound;Neuromuscular re-education;Patient/family education;Taping;Dry needling;Passive range of motion   PT Next Visit Plan gait training, flexibilty, gait training, manual therapy   Consulted and Agree with Plan of Care Patient      Patient will benefit from skilled therapeutic intervention in order to improve the following deficits and impairments:  Abnormal gait, Decreased range of motion, Difficulty walking, Pain, Decreased strength, Impaired flexibility, Decreased activity tolerance  Visit Diagnosis: Pain in right hip  Other abnormalities of gait and mobility  Stiffness of right hip, not elsewhere classified     Problem List There are no active problems to display for this patient.   Sigurd Sos, PT 04/29/2016 7:59 AM  [facility] [address] [place], [place], [address] Phone: [phone]   Fax:  [phone]  Name: Angela Lin MRN: [id number] Date of Birth: 1961/07/11

## 2016-05-04 ENCOUNTER — Ambulatory Visit: Payer: BLUE CROSS/BLUE SHIELD | Admitting: Physical Therapy

## 2016-05-04 DIAGNOSIS — M25551 Pain in right hip: Secondary | ICD-10-CM | POA: Diagnosis not present

## 2016-05-04 DIAGNOSIS — M25651 Stiffness of right hip, not elsewhere classified: Secondary | ICD-10-CM

## 2016-05-04 DIAGNOSIS — R2689 Other abnormalities of gait and mobility: Secondary | ICD-10-CM

## 2016-05-04 NOTE — Therapy (Addendum)
[facility] [address] [place], [place], [address] Phone: [phone]   Fax:  [phone]  Physical Therapy Treatment  Patient Details  Name: Angela Lin MRN: [id number] Date of Birth: 04/09/1961 Referring Provider: Suella Grove, PA  Encounter Date: 05/04/2016      PT End of Session - 05/04/16 1958    Visit Number 8   Date for PT Re-Evaluation 06/02/16   PT Start Time 0738   PT Stop Time 0811   PT Time Calculation (min) 33 min   Activity Tolerance Patient tolerated treatment well      No past medical history on file.  No past surgical history on file.  There were no vitals filed for this visit.      Subjective Assessment - 05/04/16 1945    Subjective Patient arrives 8 min late.  Patient states she had lot of pain yesterday for unknown reason, maybe sat more?   Currently in Pain? Yes   Pain Score 5    Pain Orientation Right   Pain Frequency Constant                         [facility] Adult PT Treatment/Exercise - 05/04/16 0001    Knee/Hip Exercises: Standing   SLS right and left 10 sec each   Other Standing Knee Exercises Trendelenberg ex 10x for gluteal strengthening   Knee/Hip Exercises: Sidelying   Clams 12x   Moist Heat Therapy   Number Minutes Moist Heat 10 Minutes   Moist Heat Location Hip   Manual Therapy   Joint Mobilization long axis distraction, inferior glide, PA in IR grade 3 3x 20 sec   Soft tissue mobilization gluteals, piriformis          Trigger Point Dry Needling - 05/04/16 1956    Consent Given? Yes   Muscles Treated Lower Body Gluteus minimus;Gluteus maximus;Piriformis   Gluteus Maximus Response Palpable increased muscle length   Gluteus Minimus Response Palpable increased muscle length   Piriformis Response Palpable increased muscle length              PT Education - 05/04/16 1957    Education provided Yes   Education Details clam ex,  trendelenberg ex   Person(s) Educated Patient   Methods Explanation;Demonstration   Comprehension Verbalized understanding;Returned demonstration          PT Short Term Goals - 05/04/16 2006    PT SHORT TERM GOAL #1   Title be independent in initial HEP   Status Achieved   PT SHORT TERM GOAL #2   Title report < or = to 4/10 Rt hip pain with walking   Status Achieved   PT SHORT TERM GOAL #3   Title walk for 10 minutes without limitation due to Rt hip pain   Status Achieved           PT Long Term Goals - 05/04/16 2006    PT LONG TERM GOAL #1   Title be independent in advanced HEP   Time 8   Period Weeks   Status On-going   PT LONG TERM GOAL #2   Title reduce FOTO to < or = to 43% limitation   Time 8   Period Weeks   Status On-going   PT LONG TERM GOAL #3   Title demonstrate symmetry with ambulation on level surface   Time 8   Period Weeks   Status On-going   PT LONG TERM  GOAL #4   Title report < or = to 3/10 Rt hip pain with standing and walking to allow for return to part time job   Status Achieved   PT LONG TERM GOAL #5   Title walk for 30 minutes without limitation without rest to improve community function   Time 8   Period Weeks   Status On-going               Plan - 05/04/16 1958    Clinical Impression Statement The patient demonstrates wide based teetering gait.  In right single leg standing she compensates for weakness with lateral trunk lean/pelvic drop.  Tender points persist in medial border of piriformis.  Improved muscle length following treatment interventions.     PT Next Visit Plan assess response to DN #4;  continue manual therapy and add prone hip mobs; gluteal strengthening       Patient will benefit from skilled therapeutic intervention in order to improve the following deficits and impairments:     Visit Diagnosis: Pain in right hip  Other abnormalities of gait and mobility  Stiffness of right hip, not elsewhere  classified     Problem List There are no active problems to display for this patient.   Ruben Im, PT 05/04/2016 8:07 PM Phone: [phone] Fax: [phone]  Alvera Singh 05/04/2016, 8:07 PM PHYSICAL THERAPY DISCHARGE SUMMARY  Visits from Start of Care: 8  Current functional level related to goals / functional outcomes: Pt attended 8 PT sessions and wanted to be placed on hold until she saw orthopedic MD.  Pt didn't return to PT.     Remaining deficits: See above for most current PT status.     Education / Equipment: HEP, Economist Plan: Patient agrees to discharge.  Patient goals were partially met. Patient is being discharged due to the patient's request.  ?????   Sigurd Sos, PT 06/09/2016 10:06 AM  [facility] [address], [place], [place], [address] Phone: [phone]   Fax:  [phone]  Name: ALZADA BRAZEE MRN: [id number] Date of Birth: 1961/01/07

## 2016-05-06 ENCOUNTER — Ambulatory Visit: Payer: BLUE CROSS/BLUE SHIELD | Admitting: Physical Therapy

## 2016-05-25 NOTE — Patient Instructions (Addendum)
____________________________________               Angela Lin Medal  05/26/2016   Your procedure is scheduled on: 06/04/2016    Report to [facility] Main  Entrance take [facility]  elevators to 3rd floor to  [place] at     1000 AM.  Call this number if you have problems the morning of surgery [phone]   Remember: ONLY 1 PERSON MAY GO WITH YOU TO SHORT STAY TO GET  READY MORNING OF Angela Lin.  Do not eat food or drink liquids :After Midnight.     Take these medicines the morning of surgery with A SIP OF WATER: None.                                You may not have any metal on your body including hair pins and              piercings  Do not wear jewelry, make-up, lotions, powders or perfumes, deodorant             Do not wear nail polish.  Do not shave  48 hours prior to surgery.                Do not bring valuables to the hospital. Angela Lin.  Contacts, dentures or bridgework may not be worn into surgery.  Leave suitcase in the car. After surgery it may be brought to your room.       Special Instructions:               Please read over the following fact sheets you were given: _____________________________________________________________________ [facility] - Preparing for Surgery Before surgery, you can play an important role.  Because skin is not sterile, your skin needs to be as free of germs as possible.  You can reduce the number of germs on your skin by washing with CHG (chlorahexidine gluconate) soap before surgery.  CHG is an antiseptic cleaner which kills germs and bonds with the skin to continue killing germs even after washing. Please DO NOT use if you have an allergy to CHG or antibacterial soaps.  If your skin becomes reddened/irritated stop using the CHG and inform your nurse when you arrive at Short Stay. Do not shave (including legs and underarms) for at least 48 hours prior to the first CHG shower.   You may shave your face/neck. Please follow these instructions carefully:  1.  Shower with CHG Soap the night before surgery and the  morning of Surgery.  2.  If you choose to wash your hair, wash your hair first as usual with your  normal  shampoo.  3.  After you shampoo, rinse your hair and body thoroughly to remove the  shampoo.                           4.  Use CHG as you would any other liquid soap.  You can apply chg directly  to the skin and wash                       Gently with a scrungie or clean washcloth.  5.  Apply the CHG Soap to your body ONLY FROM THE  NECK DOWN.   Do not use on face/ open                           Wound or open sores. Avoid contact with eyes, ears mouth and genitals (private parts).                       Wash face,  Genitals (private parts) with your normal soap.             6.  Wash thoroughly, paying special attention to the area where your surgery  will be performed.  7.  Thoroughly rinse your body with warm water from the neck down.  8.  DO NOT shower/wash with your normal soap after using and rinsing off  the CHG Soap.                9.  Pat yourself dry with a clean towel.            10.  Wear clean pajamas.            11.  Place clean sheets on your bed the night of your first shower and do not  sleep with pets. Day of Surgery : Do not apply any lotions/deodorants the morning of surgery.  Please wear clean clothes to the hospital/surgery center.  FAILURE TO FOLLOW THESE INSTRUCTIONS MAY RESULT IN THE CANCELLATION OF YOUR SURGERY PATIENT SIGNATURE_________________________________  NURSE SIGNATURE__________________________________  ________________________________________________________________________             _______________________________________________________________________  Angela Lin  An incentive spirometer is a tool that can help keep your lungs clear and active. This tool measures how well you are filling your lungs with  each breath. Taking long deep breaths may help reverse or decrease the chance of developing breathing (pulmonary) problems (especially infection) following:  A long period of time when you are unable to move or be active. BEFORE THE PROCEDURE   If the spirometer includes an indicator to show your best effort, your nurse or respiratory therapist will set it to a desired goal.  If possible, sit up straight or lean slightly forward. Try not to slouch.  Hold the incentive spirometer in an upright position. INSTRUCTIONS FOR USE  1. Sit on the edge of your bed if possible, or sit up as far as you can in bed or on a chair. 2. Hold the incentive spirometer in an upright position. 3. Breathe out normally. 4. Place the mouthpiece in your mouth and seal your lips tightly around it. 5. Breathe in slowly and as deeply as possible, raising the piston or the ball toward the top of the column. 6. Hold your breath for 3-5 seconds or for as long as possible. Allow the piston or ball to fall to the bottom of the column. 7. Remove the mouthpiece from your mouth and breathe out normally. 8. Rest for a few seconds and repeat Steps 1 through 7 at least 10 times every 1-2 hours when you are awake. Take your time and take a few normal breaths between deep breaths. 9. The spirometer may include an indicator to show your best effort. Use the indicator as a goal to work toward during each repetition. 10. After each set of 10 deep breaths, practice coughing to be sure your lungs are clear. If you have an incision (the cut made at the time of surgery), support your incision when coughing by placing a  pillow or rolled up towels firmly against it. Once you are able to get out of bed, walk around indoors and cough well. You may stop using the incentive spirometer when instructed by your caregiver.  RISKS AND COMPLICATIONS  Take your time so you do not get dizzy or light-headed.  If you are in pain, you may need to take or  ask for pain medication before doing incentive spirometry. It is harder to take a deep breath if you are having pain. AFTER USE  Rest and breathe slowly and easily.  It can be helpful to keep track of a log of your progress. Your caregiver can provide you with a simple table to help with this. If you are using the spirometer at home, follow these instructions: [place] IF:   You are having difficultly using the spirometer.  You have trouble using the spirometer as often as instructed.  Your pain medication is not giving enough relief while using the spirometer.  You develop fever of 100.5 F (38.1 C) or higher. SEEK IMMEDIATE MEDICAL CARE IF:   You cough up bloody sputum that had not been present before.  You develop fever of 102 F (38.9 C) or greater.  You develop worsening pain at or near the incision site. MAKE SURE YOU:   Understand these instructions.  Will watch your condition.  Will get help right away if you are not doing well or get worse. Document Released: 04/25/2007 Document Revised: 03/06/2012 Document Reviewed: 06/26/2007 [facility] Patient Information 2014 [place], [place].   ________________________________________________________________________

## 2016-05-26 ENCOUNTER — Other Ambulatory Visit: Payer: Self-pay | Admitting: Physician Assistant

## 2016-05-27 ENCOUNTER — Encounter (HOSPITAL_COMMUNITY)
Admission: RE | Admit: 2016-05-27 | Discharge: 2016-05-27 | Disposition: A | Discharge status: Home / Self Care | Payer: BLUE CROSS/BLUE SHIELD | Source: Ambulatory Visit | Attending: Orthopaedic Surgery | Admitting: Orthopaedic Surgery

## 2016-05-27 ENCOUNTER — Encounter (HOSPITAL_COMMUNITY): Payer: Self-pay

## 2016-05-27 DIAGNOSIS — Z01812 Encounter for preprocedural laboratory examination: Secondary | ICD-10-CM | POA: Insufficient documentation

## 2016-05-27 DIAGNOSIS — M1611 Unilateral primary osteoarthritis, right hip: Secondary | ICD-10-CM | POA: Insufficient documentation

## 2016-05-27 HISTORY — DX: Unspecified osteoarthritis, unspecified site: M19.90

## 2016-05-27 LAB — CBC
HCT: 40.4 % (ref 36.0–46.0)
Hemoglobin: 14.5 g/dL (ref 12.0–15.0)
MCH: 30.9 pg (ref 26.0–34.0)
MCHC: 35.9 g/dL (ref 30.0–36.0)
MCV: 86.1 fL (ref 78.0–100.0)
PLATELETS: 242 10*3/uL (ref 150–400)
RBC: 4.69 MIL/uL (ref 3.87–5.11)
RDW: 13 % (ref 11.5–15.5)
WBC: 6.9 10*3/uL (ref 4.0–10.5)

## 2016-05-27 LAB — ABO/RH: ABO/RH(D): A POS

## 2016-05-27 LAB — SURGICAL PCR SCREEN
MRSA, PCR: NEGATIVE
Staphylococcus aureus: NEGATIVE

## 2016-06-04 ENCOUNTER — Inpatient Hospital Stay (HOSPITAL_COMMUNITY): Payer: BLUE CROSS/BLUE SHIELD

## 2016-06-04 ENCOUNTER — Inpatient Hospital Stay (HOSPITAL_COMMUNITY): Payer: BLUE CROSS/BLUE SHIELD | Admitting: Registered Nurse

## 2016-06-04 ENCOUNTER — Encounter (HOSPITAL_COMMUNITY): Payer: Self-pay

## 2016-06-04 ENCOUNTER — Encounter (HOSPITAL_COMMUNITY)
Admission: RE | Disposition: A | Discharge status: Home Health | Payer: Self-pay | Source: Ambulatory Visit | Attending: Orthopaedic Surgery

## 2016-06-04 ENCOUNTER — Inpatient Hospital Stay (HOSPITAL_COMMUNITY)
Admission: RE | Admit: 2016-06-04 | Discharge: 2016-06-05 | DRG: 470 | Disposition: A | Discharge status: Home Health | Payer: BLUE CROSS/BLUE SHIELD | Source: Ambulatory Visit | Attending: Orthopaedic Surgery | Admitting: Orthopaedic Surgery

## 2016-06-04 DIAGNOSIS — M25751 Osteophyte, right hip: Secondary | ICD-10-CM | POA: Diagnosis not present

## 2016-06-04 DIAGNOSIS — M1611 Unilateral primary osteoarthritis, right hip: Secondary | ICD-10-CM | POA: Diagnosis not present

## 2016-06-04 DIAGNOSIS — Z96641 Presence of right artificial hip joint: Secondary | ICD-10-CM | POA: Diagnosis not present

## 2016-06-04 DIAGNOSIS — M25451 Effusion, right hip: Secondary | ICD-10-CM | POA: Diagnosis not present

## 2016-06-04 DIAGNOSIS — Z471 Aftercare following joint replacement surgery: Secondary | ICD-10-CM | POA: Diagnosis not present

## 2016-06-04 DIAGNOSIS — Z87891 Personal history of nicotine dependence: Secondary | ICD-10-CM

## 2016-06-04 DIAGNOSIS — M879 Osteonecrosis, unspecified: Secondary | ICD-10-CM | POA: Diagnosis not present

## 2016-06-04 DIAGNOSIS — R52 Pain, unspecified: Secondary | ICD-10-CM

## 2016-06-04 DIAGNOSIS — M25551 Pain in right hip: Secondary | ICD-10-CM | POA: Diagnosis not present

## 2016-06-04 DIAGNOSIS — M87051 Idiopathic aseptic necrosis of right femur: Secondary | ICD-10-CM

## 2016-06-04 DIAGNOSIS — M87052 Idiopathic aseptic necrosis of left femur: Secondary | ICD-10-CM

## 2016-06-04 HISTORY — PX: TOTAL HIP ARTHROPLASTY: SHX124

## 2016-06-04 LAB — TYPE AND SCREEN
ABO/RH(D): A POS
Antibody Screen: NEGATIVE

## 2016-06-04 SURGERY — ARTHROPLASTY, HIP, TOTAL, ANTERIOR APPROACH
Anesthesia: Monitor Anesthesia Care | Site: Hip | Laterality: Right

## 2016-06-04 MED ORDER — PHENYLEPHRINE HCL 10 MG/ML IJ SOLN
INTRAMUSCULAR | Status: DC | PRN
  Administered 2016-06-04 (×2): 80 ug via INTRAVENOUS

## 2016-06-04 MED ORDER — ACETAMINOPHEN 650 MG RE SUPP: 650.0000 mg | Freq: Four times a day (QID) | RECTAL | Status: DC | PRN

## 2016-06-04 MED ORDER — OXYCODONE HCL 5 MG PO TABS: 5.0000 mg | ORAL_TABLET | Freq: Once | ORAL | Status: DC | PRN

## 2016-06-04 MED ORDER — PROPOFOL 10 MG/ML IV BOLUS
INTRAVENOUS | Status: DC | PRN
  Administered 2016-06-04: 40 mg via INTRAVENOUS

## 2016-06-04 MED ORDER — TRANEXAMIC ACID 1000 MG/10ML IV SOLN
1000.0000 mg | INTRAVENOUS | Status: AC
  Administered 2016-06-04: 1000 mg via INTRAVENOUS
  Filled 2016-06-04: qty 10

## 2016-06-04 MED ORDER — CHLORHEXIDINE GLUCONATE 4 % EX LIQD: 60.0000 mL | Freq: Once | CUTANEOUS | Status: DC

## 2016-06-04 MED ORDER — METHOCARBAMOL 1000 MG/10ML IJ SOLN
500.0000 mg | Freq: Four times a day (QID) | INTRAVENOUS | Status: DC | PRN
  Filled 2016-06-04: qty 5

## 2016-06-04 MED ORDER — METHOCARBAMOL 500 MG PO TABS
500.0000 mg | ORAL_TABLET | Freq: Four times a day (QID) | ORAL | Status: DC | PRN
  Administered 2016-06-04 – 2016-06-05 (×2): 500 mg via ORAL
  Filled 2016-06-04: qty 1

## 2016-06-04 MED ORDER — DEXAMETHASONE SODIUM PHOSPHATE 10 MG/ML IJ SOLN
INTRAMUSCULAR | Status: DC | PRN
  Administered 2016-06-04: 10 mg via INTRAVENOUS

## 2016-06-04 MED ORDER — OXYCODONE HCL 5 MG/5ML PO SOLN
5.0000 mg | Freq: Once | ORAL | Status: DC | PRN
  Filled 2016-06-04: qty 5

## 2016-06-04 MED ORDER — SODIUM CHLORIDE 0.9 % IR SOLN
Status: DC | PRN
  Administered 2016-06-04: 1000 mL

## 2016-06-04 MED ORDER — CEFAZOLIN SODIUM-DEXTROSE 2-4 GM/100ML-% IV SOLN
2.0000 g | INTRAVENOUS | Status: AC
Start: 2016-06-04 — End: 2016-06-04
  Administered 2016-06-04: 2 g via INTRAVENOUS
  Filled 2016-06-04: qty 100

## 2016-06-04 MED ORDER — LACTATED RINGERS IV SOLN
INTRAVENOUS | Status: DC
  Administered 2016-06-04: 11:00:00 via INTRAVENOUS

## 2016-06-04 MED ORDER — ONDANSETRON HCL 4 MG PO TABS: 4.0000 mg | ORAL_TABLET | Freq: Four times a day (QID) | ORAL | Status: DC | PRN

## 2016-06-04 MED ORDER — MENTHOL 3 MG MT LOZG: 1.0000 | LOZENGE | OROMUCOSAL | Status: DC | PRN

## 2016-06-04 MED ORDER — HYDROMORPHONE HCL 1 MG/ML IJ SOLN: 0.2500 mg | INTRAMUSCULAR | Status: DC | PRN

## 2016-06-04 MED ORDER — ACETAMINOPHEN 10 MG/ML IV SOLN
INTRAVENOUS | Status: AC
  Filled 2016-06-04: qty 100

## 2016-06-04 MED ORDER — CEFAZOLIN SODIUM 1-5 GM-% IV SOLN
1.0000 g | Freq: Four times a day (QID) | INTRAVENOUS | Status: AC
  Administered 2016-06-04 – 2016-06-05 (×2): 1 g via INTRAVENOUS
  Filled 2016-06-04 (×3): qty 50

## 2016-06-04 MED ORDER — PHENYLEPHRINE 40 MCG/ML (10ML) SYRINGE FOR IV PUSH (FOR BLOOD PRESSURE SUPPORT)
PREFILLED_SYRINGE | INTRAVENOUS | Status: AC
  Filled 2016-06-04: qty 10

## 2016-06-04 MED ORDER — DIPHENHYDRAMINE HCL 12.5 MG/5ML PO ELIX
12.5000 mg | ORAL_SOLUTION | ORAL | Status: DC | PRN
Start: 2016-06-04 — End: 2016-06-05

## 2016-06-04 MED ORDER — ONDANSETRON HCL 4 MG/2ML IJ SOLN: 4.0000 mg | Freq: Four times a day (QID) | INTRAMUSCULAR | Status: DC | PRN

## 2016-06-04 MED ORDER — OXYCODONE HCL 5 MG PO TABS
5.0000 mg | ORAL_TABLET | ORAL | Status: DC | PRN
  Administered 2016-06-04: 5 mg via ORAL
  Filled 2016-06-04: qty 2
  Filled 2016-06-04: qty 1

## 2016-06-04 MED ORDER — PROPOFOL 10 MG/ML IV BOLUS
INTRAVENOUS | Status: AC
  Filled 2016-06-04: qty 60

## 2016-06-04 MED ORDER — FENTANYL CITRATE (PF) 100 MCG/2ML IJ SOLN
INTRAMUSCULAR | Status: DC | PRN
  Administered 2016-06-04: 100 ug via INTRAVENOUS

## 2016-06-04 MED ORDER — ACETAMINOPHEN 10 MG/ML IV SOLN
1000.0000 mg | Freq: Once | INTRAVENOUS | Status: AC
  Administered 2016-06-04: 1000 mg via INTRAVENOUS

## 2016-06-04 MED ORDER — ALUM & MAG HYDROXIDE-SIMETH 200-200-20 MG/5ML PO SUSP: 30.0000 mL | ORAL | Status: DC | PRN

## 2016-06-04 MED ORDER — PROPOFOL 500 MG/50ML IV EMUL
INTRAVENOUS | Status: DC | PRN
  Administered 2016-06-04: 75 ug/kg/min via INTRAVENOUS

## 2016-06-04 MED ORDER — ASPIRIN EC 325 MG PO TBEC
325.0000 mg | DELAYED_RELEASE_TABLET | Freq: Two times a day (BID) | ORAL | Status: DC
  Administered 2016-06-05: 325 mg via ORAL
  Filled 2016-06-04: qty 1

## 2016-06-04 MED ORDER — LACTATED RINGERS IV SOLN
INTRAVENOUS | Status: DC | PRN
  Administered 2016-06-04 (×3): via INTRAVENOUS

## 2016-06-04 MED ORDER — DEXAMETHASONE SODIUM PHOSPHATE 10 MG/ML IJ SOLN
INTRAMUSCULAR | Status: AC
  Filled 2016-06-04: qty 1

## 2016-06-04 MED ORDER — MIDAZOLAM HCL 2 MG/2ML IJ SOLN
INTRAMUSCULAR | Status: AC
  Filled 2016-06-04: qty 2

## 2016-06-04 MED ORDER — FENTANYL CITRATE (PF) 100 MCG/2ML IJ SOLN
INTRAMUSCULAR | Status: AC
  Filled 2016-06-04: qty 2

## 2016-06-04 MED ORDER — ACETAMINOPHEN 325 MG PO TABS
650.0000 mg | ORAL_TABLET | Freq: Four times a day (QID) | ORAL | Status: DC | PRN
  Administered 2016-06-04 – 2016-06-05 (×2): 650 mg via ORAL
  Filled 2016-06-04 (×2): qty 2

## 2016-06-04 MED ORDER — BUPIVACAINE IN DEXTROSE 0.75-8.25 % IT SOLN
INTRATHECAL | Status: DC | PRN
  Administered 2016-06-04: 2 mL via INTRATHECAL

## 2016-06-04 MED ORDER — HYDROMORPHONE HCL 1 MG/ML IJ SOLN: 1.0000 mg | INTRAMUSCULAR | Status: DC | PRN

## 2016-06-04 MED ORDER — ACETAMINOPHEN 325 MG PO TABS: 325.0000 mg | ORAL_TABLET | ORAL | Status: DC | PRN

## 2016-06-04 MED ORDER — BUPIVACAINE HCL (PF) 0.5 % IJ SOLN: INTRAMUSCULAR | Status: DC | PRN

## 2016-06-04 MED ORDER — ACETAMINOPHEN 160 MG/5ML PO SOLN: 325.0000 mg | ORAL | Status: DC | PRN

## 2016-06-04 MED ORDER — DOCUSATE SODIUM 100 MG PO CAPS
100.0000 mg | ORAL_CAPSULE | Freq: Two times a day (BID) | ORAL | Status: DC
  Administered 2016-06-04 – 2016-06-05 (×2): 100 mg via ORAL
  Filled 2016-06-04: qty 1

## 2016-06-04 MED ORDER — ZOLPIDEM TARTRATE 5 MG PO TABS: 5.0000 mg | ORAL_TABLET | Freq: Every evening | ORAL | Status: DC | PRN

## 2016-06-04 MED ORDER — SODIUM CHLORIDE 0.9 % IV SOLN
INTRAVENOUS | Status: DC
Start: 2016-06-04 — End: 2016-06-05
  Administered 2016-06-04: 18:00:00 via INTRAVENOUS

## 2016-06-04 MED ORDER — ONDANSETRON HCL 4 MG/2ML IJ SOLN
INTRAMUSCULAR | Status: DC | PRN
  Administered 2016-06-04: 4 mg via INTRAVENOUS

## 2016-06-04 MED ORDER — METOCLOPRAMIDE HCL 5 MG PO TABS: 5.0000 mg | ORAL_TABLET | Freq: Three times a day (TID) | ORAL | Status: DC | PRN

## 2016-06-04 MED ORDER — PHENOL 1.4 % MT LIQD: 1.0000 | OROMUCOSAL | Status: DC | PRN

## 2016-06-04 MED ORDER — MIDAZOLAM HCL 5 MG/5ML IJ SOLN
INTRAMUSCULAR | Status: DC | PRN
  Administered 2016-06-04: 2 mg via INTRAVENOUS

## 2016-06-04 MED ORDER — METOCLOPRAMIDE HCL 5 MG/ML IJ SOLN: 5.0000 mg | Freq: Three times a day (TID) | INTRAMUSCULAR | Status: DC | PRN

## 2016-06-04 MED ORDER — CEFAZOLIN SODIUM-DEXTROSE 2-4 GM/100ML-% IV SOLN
INTRAVENOUS | Status: AC
  Filled 2016-06-04: qty 100

## 2016-06-04 SURGICAL SUPPLY — 35 items
APL SKNCLS STERI-STRIP NONHPOA (GAUZE/BANDAGES/DRESSINGS) ×1
BENZOIN TINCTURE PRP APPL 2/3 (GAUZE/BANDAGES/DRESSINGS) ×1 IMPLANT
BLADE SAW SGTL 18X1.27X75 (BLADE) ×2 IMPLANT
CAPT HIP TOTAL 2 ×1 IMPLANT
CELLS DAT CNTRL 66122 CELL SVR (MISCELLANEOUS) ×1 IMPLANT
CLOTH BEACON ORANGE TIMEOUT ST (SAFETY) ×2 IMPLANT
DRAPE STERI IOBAN 125X83 (DRAPES) ×2 IMPLANT
DRAPE U-SHAPE 47X51 STRL (DRAPES) ×4 IMPLANT
DRSG AQUACEL AG ADV 3.5X10 (GAUZE/BANDAGES/DRESSINGS) ×2 IMPLANT
DURAPREP 26ML APPLICATOR (WOUND CARE) ×2 IMPLANT
ELECT REM PT RETURN 9FT ADLT (ELECTROSURGICAL) ×2
ELECTRODE REM PT RTRN 9FT ADLT (ELECTROSURGICAL) ×1 IMPLANT
GAUZE XEROFORM 1X8 LF (GAUZE/BANDAGES/DRESSINGS) ×1 IMPLANT
GLOVE BIO SURGEON STRL SZ7.5 (GLOVE) ×2 IMPLANT
GLOVE BIOGEL PI IND STRL 8 (GLOVE) ×2 IMPLANT
GLOVE BIOGEL PI INDICATOR 8 (GLOVE) ×2
GLOVE ECLIPSE 8.0 STRL XLNG CF (GLOVE) ×2 IMPLANT
GOWN STRL REUS W/TWL 2XL LVL3 (GOWN DISPOSABLE) ×1 IMPLANT
GOWN STRL REUS W/TWL XL LVL3 (GOWN DISPOSABLE) ×4 IMPLANT
HANDPIECE INTERPULSE COAX TIP (DISPOSABLE) ×2
HOLDER FOLEY CATH W/STRAP (MISCELLANEOUS) ×2 IMPLANT
PACK ANTERIOR HIP CUSTOM (KITS) ×2 IMPLANT
RETRACTOR WND ALEXIS 18 MED (MISCELLANEOUS) ×1 IMPLANT
RTRCTR WOUND ALEXIS 18CM MED (MISCELLANEOUS) ×2
SET HNDPC FAN SPRY TIP SCT (DISPOSABLE) ×1 IMPLANT
STRIP CLOSURE SKIN 1/2X4 (GAUZE/BANDAGES/DRESSINGS) ×1 IMPLANT
SUT ETHIBOND NAB CT1 #1 30IN (SUTURE) ×2 IMPLANT
SUT ETHILON 3 0 PS 1 (SUTURE) ×1 IMPLANT
SUT MNCRL AB 4-0 PS2 18 (SUTURE) ×1 IMPLANT
SUT VIC AB 0 CT1 36 (SUTURE) ×2 IMPLANT
SUT VIC AB 1 CT1 36 (SUTURE) ×2 IMPLANT
SUT VIC AB 2-0 CT1 27 (SUTURE) ×4
SUT VIC AB 2-0 CT1 TAPERPNT 27 (SUTURE) ×2 IMPLANT
TRAY FOLEY W/METER SILVER 14FR (SET/KITS/TRAYS/PACK) ×2 IMPLANT
YANKAUER SUCT BULB TIP NO VENT (SUCTIONS) ×2 IMPLANT

## 2016-06-04 NOTE — Anesthesia Procedure Notes (Signed)
Spinal Patient location during procedure: OR Start time: 06/04/2016 12:55 PM End time: 06/04/2016 1:10 PM Staffing Anesthesiologist: Alexis Frock Preanesthetic Checklist Completed: patient identified, site marked, surgical consent, pre-op evaluation, timeout performed, IV checked, risks and benefits discussed and monitors and equipment checked Spinal Block Patient position: sitting Prep: ChloraPrep Patient monitoring: heart rate, continuous pulse ox, blood pressure and cardiac monitor Approach: midline Location: L3-4 Injection technique: single-shot Needle Needle type: [place], Introducer and Pencil-Tip  Needle gauge: 24 G Needle length: 9 cm Needle insertion depth: 6 cm Additional Notes Negative paresthesia. Negative blood return. Positive free-flowing CSF. Expiration date of kit checked and confirmed. Patient tolerated procedure well, without complications.  I Angela Lin after she attempted at L4- L5.

## 2016-06-04 NOTE — Transfer of Care (Signed)
Immediate Anesthesia Transfer of Care Note  Patient: Angela Lin  Procedure(s) Performed: Procedure(s): RIGHT TOTAL HIP ARTHROPLASTY ANTERIOR APPROACH (Right)  Patient Location: PACU  Anesthesia Type:MAC and Spinal  Level of Consciousness:  sedated, patient cooperative and responds to stimulation  Airway & Oxygen Therapy:Patient Spontanous Breathing and Patient connected to face mask oxgen  Post-op Assessment:  Report given to PACU RN and Post -op Vital signs reviewed and stable  Post vital signs:  Reviewed and stable  Last Vitals:  Filed Vitals:   06/04/16 1025  BP: 150/72  Pulse: 80  Temp: 36.8 C  Resp: 16    Complications: No apparent anesthesia complications

## 2016-06-04 NOTE — Anesthesia Postprocedure Evaluation (Signed)
Anesthesia Post Note  Patient: Angela Lin  Procedure(s) Performed: Procedure(s) (LRB): RIGHT TOTAL HIP ARTHROPLASTY ANTERIOR APPROACH (Right)  Patient location during evaluation: PACU Anesthesia Type: Spinal Level of consciousness: awake Pain management: pain level controlled Vital Signs Assessment: post-procedure vital signs reviewed and stable Respiratory status: spontaneous breathing Cardiovascular status: stable Postop Assessment: spinal receding and no signs of nausea or vomiting Anesthetic complications: no    Last Vitals:  Filed Vitals:   06/04/16 1700 06/04/16 1800  BP: 164/86 156/87  Pulse: 81 82  Temp: 36.4 C 36.8 C  Resp: 16 16    Last Pain:  Filed Vitals:   06/04/16 1822  PainSc: 5                  Raquell Richer

## 2016-06-04 NOTE — H&P (Deleted)
TOTAL HIP ADMISSION H&P  Patient is admitted for bilaterally total hip arthroplasty.  Subjective:  Chief Complaint: bilaterally hip pain  HPI: Angela Lin, 55 y.o. female, has a history of pain and functional disability in the bilaterally hip(s) due to idiopathic avascular necrosis and patient has failed non-surgical conservative treatments for greater than 12 weeks to include NSAID's and/or analgesics, use of assistive devices and activity modification.  Onset of symptoms was abrupt starting 3 years ago with rapidlly worsening course since that time.The patient noted no past surgery on the bilaterally hip(s).  Patient currently rates pain in the bilaterally hip at 10 out of 10 with activity. Patient has night pain, worsening of pain with activity and weight bearing, trendelenberg gait, pain that interfers with activities of daily living and pain with passive range of motion. Patient has evidence of subchondral cysts, subchondral sclerosis and joint space narrowing by imaging studies. This condition presents safety issues increasing the risk of falls.  There is no current active infection.  Patient Active Problem List   Diagnosis Date Noted  . Osteoarthritis of right hip 06/04/2016  . Avascular necrosis of bones of both hips ([place]) 06/04/2016   Past Medical History  Diagnosis Date  . Arthritis     osteoarthritis- hip    Past Surgical History  Procedure Laterality Date  . Diagnostic laparoscopy      endometriosis- 20 yrs ago    No prescriptions prior to admission   No Known Allergies  Social History  Substance Use Topics  . Smoking status: Former Smoker -- 0.50 packs/day    Types: Cigarettes  . Smokeless tobacco: Never Used     Comment: past was able to quit- now smoking 0.5 ppd  . Alcohol Use: Yes     Comment: 2-3 times weekly    No family history on file.   Review of Systems  Musculoskeletal: Positive for back pain and joint pain.  All other systems reviewed and  are negative.   Objective:  Physical Exam  Constitutional: She is oriented to person, place, and time. She appears well-developed and well-nourished.  HENT:  Head: Normocephalic and atraumatic.  Eyes: EOM are normal. Pupils are equal, round, and reactive to light.  Neck: Normal range of motion. Neck supple.  Cardiovascular: Normal rate.   Respiratory: Effort normal and breath sounds normal.  GI: Soft. Bowel sounds are normal.  Musculoskeletal:       Right hip: She exhibits decreased range of motion, decreased strength, tenderness and bony tenderness.       Left hip: She exhibits decreased range of motion, decreased strength, tenderness and bony tenderness.  Neurological: She is oriented to person, place, and time.  Skin: Skin is warm and dry.  Psychiatric: She has a normal mood and affect.    Vital signs in last 24 hours:    Labs:   There is no height or weight on file to calculate BMI.   Imaging Review Plain radiographs demonstrate end-stage avascular necrosis of the bilateral hip(s). The bone quality appears to be good for age and reported activity level.  Assessment/Plan:  End stage idiopathic avascular necrosis, bilaterally hip(s)  The patient history, physical examination, clinical judgement of the provider and imaging studies are consistent with end stage AVN of the bilaterally hip(s) and total hip arthroplasty is deemed medically necessary. The treatment options including medical management, injection therapy, arthroscopy and arthroplasty were discussed at length. The risks and benefits of total hip arthroplasty were presented and reviewed. The risks  due to aseptic loosening, infection, stiffness, dislocation/subluxation,  thromboembolic complications and other imponderables were discussed.  The patient acknowledged the explanation, agreed to proceed with the plan and consent was signed. Patient is being admitted for inpatient treatment for surgery, pain control, PT, OT,  prophylactic antibiotics, VTE prophylaxis, progressive ambulation and ADL's and discharge planning.The patient is planning to be discharged home with home health services

## 2016-06-04 NOTE — Anesthesia Preprocedure Evaluation (Addendum)
Anesthesia Evaluation  Patient identified by MRN, date of birth, ID band Patient awake    Reviewed: Allergy & Precautions, NPO status , Patient's Chart, lab work & pertinent test results  History of Anesthesia Complications Negative for: history of anesthetic complications  Airway Mallampati: II  TM Distance: >3 FB Neck ROM: Full    Dental  (+) Teeth Intact   Pulmonary neg shortness of breath, neg sleep apnea, neg COPD, neg recent URI, former smoker,    breath sounds clear to auscultation       Cardiovascular negative cardio ROS   Rhythm:Regular     Neuro/Psych negative neurological ROS  negative psych ROS   GI/Hepatic negative GI ROS, Neg liver ROS,   Endo/Other  negative endocrine ROS  Renal/GU negative Renal ROS     Musculoskeletal  (+) Arthritis ,   Abdominal   Peds  Hematology negative hematology ROS (+)   Anesthesia Other Findings   Reproductive/Obstetrics                            Anesthesia Physical Anesthesia Plan  ASA: I  Anesthesia Plan: Spinal and MAC   Post-op Pain Management:    Induction: Intravenous  Airway Management Planned: Natural Airway, Nasal Cannula and Simple Face Mask  Additional Equipment: None  Intra-op Plan:   Post-operative Plan:   Informed Consent: I have reviewed the patients History and Physical, chart, labs and discussed the procedure including the risks, benefits and alternatives for the proposed anesthesia with the patient or authorized representative who has indicated his/her understanding and acceptance.   Dental advisory given  Plan Discussed with: CRNA and Surgeon  Anesthesia Plan Comments:         Anesthesia Quick Evaluation

## 2016-06-04 NOTE — Brief Op Note (Signed)
06/04/2016  2:20 PM  PATIENT:  Angela Lin  55 y.o. female  PRE-OPERATIVE DIAGNOSIS:  severe endstage arthritis right hip  POST-OPERATIVE DIAGNOSIS:  severe endstage arthritis right hip  PROCEDURE:  Procedure(s): RIGHT TOTAL HIP ARTHROPLASTY ANTERIOR APPROACH (Right)  SURGEON:  Surgeon(s) and Role:    * Mcarthur Rossetti, MD - Primary  PHYSICIAN ASSISTANT: Benita Stabile, PA-C  ANESTHESIA:   spinal  EBL:  Total I/O In: 1000 [I.V.:1000] Out: 200 [Blood:200]  COUNTS:  YES  TOURNIQUET:  * No tourniquets in log *  DICTATION: .Other Dictation: Dictation Number [id number]  PLAN OF CARE: Admit to inpatient   PATIENT DISPOSITION:  PACU - hemodynamically stable.   Delay start of Pharmacological VTE agent (>24hrs) due to surgical blood loss or risk of bleeding: no

## 2016-06-04 NOTE — H&P (Signed)
TOTAL HIP ADMISSION H&P  Patient is admitted for right total hip arthroplasty.  Subjective:  Chief Complaint: right hip pain  HPI: Angela Lin, 55 y.o. female, has a history of pain and functional disability in the right hip(s) due to trauma and arthritis and patient has failed non-surgical conservative treatments for greater than 12 weeks to include NSAID's and/or analgesics, corticosteriod injections, flexibility and strengthening excercises, use of assistive devices and activity modification.  Onset of symptoms was gradual starting 2 years ago with rapidlly worsening course since that time.The patient noted no past surgery on the right hip(s).  Patient currently rates pain in the right hip at 10 out of 10 with activity. Patient has night pain, worsening of pain with activity and weight bearing, pain that interfers with activities of daily living and pain with passive range of motion. Patient has evidence of subchondral sclerosis, periarticular osteophytes and joint space narrowing by imaging studies. This condition presents safety issues increasing the risk of falls.  There is no current active infection.  Patient Active Problem List   Diagnosis Date Noted  . Osteoarthritis of right hip 06/04/2016   Past Medical History  Diagnosis Date  . Arthritis     osteoarthritis- hip    Past Surgical History  Procedure Laterality Date  . Diagnostic laparoscopy      endometriosis- 20 yrs ago    No prescriptions prior to admission   No Known Allergies  Social History  Substance Use Topics  . Smoking status: Former Smoker -- 0.50 packs/day    Types: Cigarettes  . Smokeless tobacco: Never Used     Comment: past was able to quit- now smoking 0.5 ppd  . Alcohol Use: Yes     Comment: 2-3 times weekly    No family history on file.   Review of Systems  Musculoskeletal: Positive for joint pain.  All other systems reviewed and are negative.   Objective:  Physical Exam   Constitutional: She is oriented to person, place, and time. She appears well-developed and well-nourished.  HENT:  Head: Normocephalic and atraumatic.  Eyes: EOM are normal. Pupils are equal, round, and reactive to light.  Neck: Normal range of motion. Neck supple.  Cardiovascular: Normal rate and regular rhythm.   Respiratory: Effort normal and breath sounds normal.  GI: Soft. Bowel sounds are normal.  Musculoskeletal:       Right hip: She exhibits decreased range of motion, decreased strength, tenderness and bony tenderness.  Neurological: She is alert and oriented to person, place, and time.  Skin: Skin is warm and dry.  Psychiatric: She has a normal mood and affect.    Vital signs in last 24 hours:    Labs:   There is no height or weight on file to calculate BMI.   Imaging Review Plain radiographs demonstrate severe degenerative joint disease of the right hip(s). The bone quality appears to be excellent for age and reported activity level.  Assessment/Plan:  End stage arthritis, right hip(s)  The patient history, physical examination, clinical judgement of the provider and imaging studies are consistent with end stage degenerative joint disease of the right hip(s) and total hip arthroplasty is deemed medically necessary. The treatment options including medical management, injection therapy, arthroscopy and arthroplasty were discussed at length. The risks and benefits of total hip arthroplasty were presented and reviewed. The risks due to aseptic loosening, infection, stiffness, dislocation/subluxation,  thromboembolic complications and other imponderables were discussed.  The patient acknowledged the explanation, agreed to proceed  with the plan and consent was signed. Patient is being admitted for inpatient treatment for surgery, pain control, PT, OT, prophylactic antibiotics, VTE prophylaxis, progressive ambulation and ADL's and discharge planning.The patient is planning to be  discharged home with home health services

## 2016-06-05 LAB — CBC
HCT: 37.4 % (ref 36.0–46.0)
Hemoglobin: 12.9 g/dL (ref 12.0–15.0)
MCH: 30.3 pg (ref 26.0–34.0)
MCHC: 34.5 g/dL (ref 30.0–36.0)
MCV: 87.8 fL (ref 78.0–100.0)
PLATELETS: 236 10*3/uL (ref 150–400)
RBC: 4.26 MIL/uL (ref 3.87–5.11)
RDW: 13.1 % (ref 11.5–15.5)
WBC: 12.7 10*3/uL — AB (ref 4.0–10.5)

## 2016-06-05 LAB — BASIC METABOLIC PANEL
ANION GAP: 6 (ref 5–15)
BUN: 8 mg/dL (ref 6–20)
CALCIUM: 9.2 mg/dL (ref 8.9–10.3)
CO2: 28 mmol/L (ref 22–32)
Chloride: 103 mmol/L (ref 101–111)
Creatinine, Ser: 0.62 mg/dL (ref 0.44–1.00)
GLUCOSE: 141 mg/dL — AB (ref 65–99)
POTASSIUM: 4.2 mmol/L (ref 3.5–5.1)
SODIUM: 137 mmol/L (ref 135–145)

## 2016-06-05 MED ORDER — METHOCARBAMOL 500 MG PO TABS: 500.0000 mg | ORAL_TABLET | Freq: Four times a day (QID) | ORAL | Status: DC | PRN

## 2016-06-05 MED ORDER — ASPIRIN 325 MG PO TBEC: 325.0000 mg | DELAYED_RELEASE_TABLET | Freq: Two times a day (BID) | ORAL | Status: DC

## 2016-06-05 NOTE — Progress Notes (Signed)
Physical Therapy Treatment Patient Details Name: Averyrose Mild MRN: [id number] DOB: 1961-01-24 Today's Date: 06/05/2016    History of Present Illness Pt is s/p R DTHA    PT Comments    POD # 1 pm session Assisted out of recliner to amb to the bathroom then in hallway to practice 2 steps no rails up backward with walker. Pt also given handout HEP and performed all standing TE's.  Followed by [facility]. Pt ready for D/C to home.  Follow Up Recommendations  Home health PT     Equipment Recommendations       Recommendations for Other Services       Precautions / Restrictions Precautions Precautions: Fall Restrictions Weight Bearing Restrictions: No Other Position/Activity Restrictions: WBAT    Mobility  Bed Mobility               General bed mobility comments: OOB in chair  Transfers Overall transfer level: Needs assistance Equipment used: Rolling walker (2 wheeled) Transfers: Sit to/from Stand Sit to Stand: Supervision         General transfer comment: cues for hand placement. and increased time  Ambulation/Gait Ambulation/Gait assistance: Supervision Ambulation Distance (Feet): 75 Feet Assistive device: Rolling walker (2 wheeled) Gait Pattern/deviations: Step-to pattern;Decreased stance time - right Gait velocity: decr   General Gait Details: cues for posture, position from RW and initial sequence   Stairs Stairs: Yes Stairs assistance: Min assist Stair Management: No rails;Step to pattern;Backwards;With walker Number of Stairs: 2 General stair comments: 25% VC's on proper tech and sequencing  Wheelchair Mobility    Modified Rankin (Stroke Patients Only)       Balance                                    Cognition Arousal/Alertness: Awake/alert Behavior During Therapy: WFL for tasks assessed/performed Overall Cognitive Status: Within Functional Limits for tasks assessed                      Exercises  10  reps standing TE's following HEP handout    General Comments        Pertinent Vitals/Pain Pain Assessment: 0-10 Pain Score: 5  Pain Location: R knee Pain Descriptors / Indicators: Aching;Sore;Throbbing Pain Intervention(s): Monitored during session;Premedicated before session;Repositioned;Ice applied    Home Living Family/patient expects to be discharged to:: Private residence Living Arrangements: Parent Available Help at Discharge: Family Type of Home: House Home Access: Stairs to enter     Home Equipment: Environmental consultant - 2 wheels;Bedside commode      Prior Function Level of Independence: Independent          PT Goals (current goals can now be found in the care plan section) Acute Rehab PT Goals Patient Stated Goal: Home with mom today Progress towards PT goals: Progressing toward goals    Frequency  7X/week    PT Plan Current plan remains appropriate    Co-evaluation             End of Session Equipment Utilized During Treatment: Gait belt Activity Tolerance: Patient tolerated treatment well Patient left: in chair;with call bell/phone within reach;with chair alarm set     Time: 1245-1310 PT Time Calculation (min) (ACUTE ONLY): 25 min  Charges:  $Gait Training: 8-22 mins $Therapeutic Exercise: 8-22 mins  G Codes:      Rica Koyanagi  PTA [facility]  Acute  Rehab Pager      [phone]

## 2016-06-05 NOTE — Evaluation (Signed)
Physical Therapy Evaluation Patient Details Name: Angela Lin MRN: [id number] DOB: [birth year] Today's Date: 06/05/2016   History of Present Illness  Pt is s/p R DTHA  Clinical Impression  Pt s.p R THR presents with decreased R LE strength/ROM and post op pain limiting functional mobility.  Pt should progress well to dc home with family assist and HHPT follow up.    Follow Up Recommendations Home health PT    Equipment Recommendations  None recommended by PT    Recommendations for Other Services OT consult     Precautions / Restrictions Precautions Precautions: Fall Restrictions Weight Bearing Restrictions: No Other Position/Activity Restrictions: WBAT      Mobility  Bed Mobility Overal bed mobility: Needs Assistance Bed Mobility: Supine to Sit     Supine to sit: Min guard     General bed mobility comments: cues for sequence and use of L LE to self assist  Transfers Overall transfer level: Needs assistance Equipment used: Rolling walker (2 wheeled) Transfers: Sit to/from Stand Sit to Stand: Min guard         General transfer comment: cues for LE management and use of UEs to self assist  Ambulation/Gait Ambulation/Gait assistance: Min assist;Min guard Ambulation Distance (Feet): 160 Feet Assistive device: Rolling walker (2 wheeled) Gait Pattern/deviations: Step-to pattern;Step-through pattern;Decreased step length - right;Decreased step length - left;Shuffle;Trunk flexed Gait velocity: decr Gait velocity interpretation: Below normal speed for age/gender General Gait Details: cues for posture, position from RW and initial sequence  Stairs            Wheelchair Mobility    Modified Rankin (Stroke Patients Only)       Balance                                             Pertinent Vitals/Pain Pain Assessment: 0-10 Pain Score: 8  Pain Location: R knee Pain Descriptors / Indicators: Aching;Sore Pain Intervention(s):  Limited activity within patient's tolerance;Monitored during session;Premedicated before session;Ice applied    Home Living Family/patient expects to be discharged to:: Private residence Living Arrangements: Alone Available Help at Discharge: Family Type of Home: House Home Access: Stairs to enter Entrance Stairs-Rails: None Technical brewer of Steps: 2 Home Layout: One level Home Equipment: Environmental consultant - 2 wheels Additional Comments: Staying at [facility]    Prior Function Level of Independence: Independent               Hand Dominance        Extremity/Trunk Assessment   Upper Extremity Assessment: Overall WFL for tasks assessed           Lower Extremity Assessment: RLE deficits/detail RLE Deficits / Details: Strengtha t hip 3-?5 with AAROM at hip to 90 flex and 20 abd    Cervical / Trunk Assessment: Normal  Communication   Communication: No difficulties  Cognition Arousal/Alertness: Awake/alert Behavior During Therapy: WFL for tasks assessed/performed Overall Cognitive Status: Within Functional Limits for tasks assessed                      General Comments      Exercises Total Joint Exercises Ankle Circles/Pumps: AROM;Both;15 reps;Supine Quad Sets: AROM;Both;10 reps;Supine Heel Slides: AAROM;20 reps;Supine;Left Hip ABduction/ADduction: AAROM;Left;15 reps;Supine      Assessment/Plan    PT Assessment Patient needs continued PT services  PT Diagnosis Difficulty walking   PT  Problem List Decreased strength;Decreased range of motion;Decreased activity tolerance;Decreased mobility;Decreased knowledge of use of DME;Pain  PT Treatment Interventions DME instruction;Gait training;Stair training;Functional mobility training;Therapeutic activities;Therapeutic exercise;Patient/family education   PT Goals (Current goals can be found in the Care Plan section) Acute Rehab PT Goals Patient Stated Goal: Home with mom today PT Goal Formulation: With  patient Time For Goal Achievement: 06/06/16 Potential to Achieve Goals: Good    Frequency 7X/week   Barriers to discharge        Co-evaluation               End of Session Equipment Utilized During Treatment: Gait belt Activity Tolerance: Patient tolerated treatment well Patient left: in chair;with call bell/phone within reach;with chair alarm set Nurse Communication: Mobility status         Time: [id number] PT Time Calculation (min) (ACUTE ONLY): 33 min   Charges:   PT Evaluation $PT Eval Low Complexity: 1 Procedure PT Treatments $Therapeutic Exercise: 8-22 mins   PT G Codes:        Angela Lin 07-02-16, 12:09 PM

## 2016-06-05 NOTE — Care Management Note (Addendum)
Case Management Note  Patient Details  Name: Angela Lin MRN: [id number] Date of Birth: 23-Nov-1961  Subjective/Objective:    Status post total replacement of right hip                Action/Plan: Discharge Planning: AVS reviewed:  NCM spoke to pt at bedside. Offered choice for HH/provided [facility] list. Pt agreeable to [facility] for [facility]. Pt states she has RW and 3n1 at home.   PCP-[facility], [facility] [place] MD  Temp alt address [address] [place] [address]   Expected Discharge Date:  06/05/2016              Expected Discharge Plan:  [place]  In-House Referral:  NA  Discharge planning Services  CM Consult  Post Acute Care Choice:  Home Health Choice offered to:  Patient  DME Arranged:  N/A DME Agency:  NA  HH Arranged:  PT HH Agency:  [place]  Status of Service:  Completed, signed off  Medicare Important Message Given:    Date Medicare IM Given:    Medicare IM give by:    Date Additional Medicare IM Given:    Additional Medicare Important Message give by:     If discussed at [place] of Stay Meetings, dates discussed:    Additional Comments:  Erenest Rasher, RN 06/05/2016, 12:22 PM

## 2016-06-05 NOTE — Evaluation (Signed)
Occupational Therapy One Time Evaluation Patient Details Name: Angela Lin MRN: [id number] DOB: [birth year] Today's Date: 06/05/2016    History of Present Illness Pt is s/p R DTHA   Clinical Impression   Pt is doing well and supposed to d/c later today. All education provided for OT and pt verbalized understanding and did well with practicing all techniques. Will sign off for acute OT.    Follow Up Recommendations  Supervision/Assistance - 24 hour    Equipment Recommendations  None recommended by OT    Recommendations for Other Services       Precautions / Restrictions Precautions Precautions: Fall Restrictions Weight Bearing Restrictions: No Other Position/Activity Restrictions: WBAT      Mobility Bed Mobility          General bed mobility comments: in chair.   Transfers Overall transfer level: Needs assistance Equipment used: Rolling walker (2 wheeled) Transfers: Sit to/from Stand Sit to Stand: Min guard         General transfer comment: cues for hand placement.    Balance                                            ADL Overall ADL's : Needs assistance/impaired Eating/Feeding: Independent;Sitting   Grooming: Wash/dry hands;Min guard;Standing   Upper Body Bathing: Set up;Sitting   Lower Body Bathing: Minimal assistance;Sit to/from stand   Upper Body Dressing : Set up;Sitting   Lower Body Dressing: Minimal assistance;Sit to/from stand   Toilet Transfer: Min guard;Ambulation;BSC;RW   Toileting- Water quality scientist and Hygiene: Min guard;Sit to/from stand   Tub/ Shower Transfer: Walk-in shower;Min Web designer ADL Comments: Educated on AE options but pt states her mother will be able to assist her until she can reach fully to her R foot to don socks, etc. She did well with threading on pants today with assist only to get over R sock due to grippers sticking. Discussed use of 3in1 as shower chair  and if 3in1 wont fit into shower , she does have a built in seat also. Discussed option of placing 3in1 facing out of shower and using walker to step backwards over ledge and be able to sit right down. Educated on safety with walker in tight spaces and proper hand placement with functional transfers. Educated on sequence for LB dressing and having walker in front of her when she stands up to pull up clothing.     Vision     Perception     Praxis      Pertinent Vitals/Pain Pain Assessment: 0-10 Pain Score: 5  Pain Location: R knee Pain Descriptors / Indicators: Aching;Sore Pain Intervention(s): Monitored during session;Repositioned;Ice applied     Hand Dominance     Extremity/Trunk Assessment Upper Extremity Assessment Upper Extremity Assessment: Defer to OT evaluation      Cervical / Trunk Assessment Cervical / Trunk Assessment: Normal   Communication Communication Communication: No difficulties   Cognition Arousal/Alertness: Awake/alert Behavior During Therapy: WFL for tasks assessed/performed Overall Cognitive Status: Within Functional Limits for tasks assessed                     General Comments       Exercises      Shoulder Instructions      Home Living Family/patient expects to be discharged to:: Private residence Living Arrangements: Parent Available  Help at Discharge: Family Type of Home: House Home Access: Stairs to enter [employer] of Steps: 1 Entrance Stairs-Rails: None Home Layout: One level     Bathroom Shower/Tub: Occupational psychologist: Standard     Home Equipment: Environmental consultant - 2 wheels;Bedside commode   Additional Comments: Staying at mother's house      Prior Functioning/Environment Level of Independence: Independent             OT Diagnosis: Generalized weakness   OT Problem List: Decreased strength   OT Treatment/Interventions:      OT Goals(Current goals can be found in the care plan section)  Acute Rehab OT Goals Patient Stated Goal: Home with mom today OT Goal Formulation: With patient  OT Frequency:     Barriers to D/C:            Co-evaluation              End of Session Equipment Utilized During Treatment: Rolling walker  Activity Tolerance: Patient tolerated treatment well Patient left: in chair;with call bell/phone within reach   Time: 1015-1048 OT Time Calculation (min): 33 min Charges:  OT General Charges $OT Visit: 1 Procedure OT Evaluation $OT Eval Low Complexity: 1 Procedure OT Treatments $Self Care/Home Management : 8-22 mins G-Codes:    Jules Schick  [id number] 06/05/2016, 12:18 PM

## 2016-06-05 NOTE — Progress Notes (Signed)
Assessment unchanged. Pt verbalized understanding of dc instructions through teach back including follow up care and when to call to doctor. Home Health PT to see post dc. Discharged via wc to front entrance to meet awaiting vehicle to carry home accompanied by NT, mother and brother.

## 2016-06-05 NOTE — Discharge Summary (Signed)
Patient ID: Bonnetta Seiwert MRN: [id number] DOB/AGE: 54-Oct-1962 54 y.o.  Admit date: 06/04/2016 Discharge date: 06/05/2016  Admission Diagnoses:  Principal Problem:   Avascular necrosis of bones of both hips ([place]) Active Problems:   Osteoarthritis of right hip   Status post total replacement of right hip   Discharge Diagnoses:  Same  Past Medical History  Diagnosis Date  . Arthritis     osteoarthritis- hip    Surgeries: Procedure(s): RIGHT TOTAL HIP ARTHROPLASTY ANTERIOR APPROACH on 06/04/2016   Consultants:    Discharged Condition: Improved  Hospital Course: Shavonne Bort Fluegel is an 55 y.o. female who was admitted 06/04/2016 for operative treatment ofAvascular necrosis of bones of both hips ([place]). Patient has severe unremitting pain that affects sleep, daily activities, and work/hobbies. After pre-op clearance the patient was taken to the operating room on 06/04/2016 and underwent  Procedure(s): RIGHT TOTAL HIP ARTHROPLASTY ANTERIOR APPROACH.    Patient was given perioperative antibiotics: Anti-infectives    Start     Dose/Rate Route Frequency Ordered Stop   06/04/16 1900  ceFAZolin (ANCEF) IVPB 1 g/50 mL premix     1 g 100 mL/hr over 30 Minutes Intravenous Every 6 hours 06/04/16 1609 06/05/16 0108   06/04/16 1018  ceFAZolin (ANCEF) IVPB 2g/100 mL premix     2 g 200 mL/hr over 30 Minutes Intravenous On call to O.R. 06/04/16 1018 06/04/16 1258       Patient was given sequential compression devices, early ambulation, and chemoprophylaxis to prevent DVT.  Patient benefited maximally from hospital stay and there were no complications.    Recent vital signs: Patient Vitals for the past 24 hrs:  BP Temp Temp src Pulse Resp SpO2 Height Weight  06/05/16 0907 (!) 156/72 mmHg 98.7 F (37.1 C) Oral 83 18 98 % - -  06/05/16 0500 (!) 168/78 mmHg 97.6 F (36.4 C) Oral 78 16 98 % - -  06/05/16 0159 (!) 160/70 mmHg 98.2 F (36.8 C) Oral 87 16 96 % - -  06/04/16 2123 (!)  165/86 mmHg 97.5 F (36.4 C) Oral 83 18 97 % - -  06/04/16 1900 (!) 162/81 mmHg 97.6 F (36.4 C) Axillary 92 18 96 % - -  06/04/16 1800 (!) 156/87 mmHg 98.2 F (36.8 C) Oral 82 16 96 % - -  06/04/16 1700 (!) 164/86 mmHg 97.6 F (36.4 C) Oral 81 16 100 % - -  06/04/16 1600 - 98.4 F (36.9 C) Oral 72 16 100 % 5\' 5"  (1.651 m) 77.565 kg (171 lb)  06/04/16 1553 (!) 158/86 mmHg 98.4 F (36.9 C) - 72 15 100 % - -  06/04/16 1530 (!) 113/95 mmHg 98 F (36.7 C) - 67 17 99 % - -  06/04/16 1515 137/76 mmHg - - 69 16 99 % - -  06/04/16 1500 128/68 mmHg 98 F (36.7 C) - 73 18 96 % - -  06/04/16 1445 126/72 mmHg - - 89 (!) 21 100 % - -  06/04/16 1442 121/72 mmHg 98 F (36.7 C) - 91 14 100 % - -  06/04/16 1027 - - - - - - 5' 5.5" (1.664 m) -  06/04/16 1025 (!) 150/72 mmHg 98.2 F (36.8 C) Oral 80 16 96 % - -     Recent laboratory studies:  Recent Labs  06/05/16 0400  WBC 12.7*  HGB 12.9  HCT 37.4  PLT 236  NA 137  K 4.2  CL 103  CO2 28  BUN 8  CREATININE 0.62  GLUCOSE 141*  CALCIUM 9.2     Discharge Medications:     Medication List    TAKE these medications        aspirin 325 MG EC tablet  Take 1 tablet (325 mg total) by mouth 2 (two) times daily after a meal.     BLACK COHOSH PO  Take 1 capsule by mouth at bedtime.     glucosamine-chondroitin 500-400 MG tablet  Take 1 tablet by mouth daily.     ibuprofen 200 MG tablet  Commonly known as:  ADVIL,MOTRIN  Take 600 mg by mouth every 6 (six) hours as needed (For pain.).     methocarbamol 500 MG tablet  Commonly known as:  ROBAXIN  Take 1 tablet (500 mg total) by mouth every 6 (six) hours as needed for muscle spasms.     milk thistle 175 MG tablet  Take 175 mg by mouth 2 (two) times daily.     neomycin-bacitracin-polymyxin ointment  Commonly known as:  NEOSPORIN  Apply 1 application topically at bedtime. Apply to lips.     OMEGA 3 PO  Take 1 capsule by mouth daily.     OVER THE COUNTER MEDICATION  Place 2  drops into both eyes every morning. Allergy Eye Relief Drops     TART CHERRY ADVANCED PO  Take 2 capsules by mouth every morning.        Diagnostic Studies: Dg C-arm 1-60 Min-no Report  06/04/2016  CLINICAL DATA: hip C-ARM 1-60 MINUTES Fluoroscopy was utilized by the requesting physician.  No radiographic interpretation.   Dg Hip Port Unilat With Pelvis 1v Right  06/04/2016  CLINICAL DATA:  Status post right hip replacement EXAM: DG HIP (WITH OR WITHOUT PELVIS) 1V PORT RIGHT COMPARISON:  09/04/2014 FINDINGS: Right hip replacement is noted. No acute bony abnormality is seen. Soft tissue changes are noted consistent with the recent surgery. IMPRESSION: Status post right hip replacement without acute abnormality. Electronically Signed   By: Inez Catalina M.D.   On: 06/04/2016 15:16   Dg Hip Operative Unilat With Pelvis Right  06/04/2016  CLINICAL DATA:  Status post right total hip replacement. 24 seconds fluoro time reported. EXAM: OPERATIVE right HIP (WITH PELVIS IF PERFORMED) 2 fluoroscopic spot VIEWS TECHNIQUE: Fluoroscopic spot image(s) were submitted for interpretation post-operatively. COMPARISON:  Right hip series of September 04, 2014 FINDINGS: Two AP views of the right hip reveal placement of a prosthetic joint. The interface of the prosthetic components with the native bone appears normal. Positioning is radiographically good. The native bone exhibits no acute abnormality. IMPRESSION: No immediate postprocedure complication following right total hip joint replacement. Electronically Signed   By: David  Martinique M.D.   On: 06/04/2016 14:45    Disposition: to home      Discharge Instructions    Call MD / Call 911    Complete by:  As directed   If you experience chest pain or shortness of breath, CALL 911 and be transported to the hospital emergency room.  If you develope a fever above 101 F, pus (white drainage) or increased drainage or redness at the wound, or calf pain, call your surgeon's  office.     Constipation Prevention    Complete by:  As directed   Drink plenty of fluids.  Prune juice may be helpful.  You may use a stool softener, such as Colace (over the counter) 100 mg twice a day.  Use MiraLax (over the counter) for constipation as needed.  Diet - low sodium heart healthy    Complete by:  As directed      Discharge patient    Complete by:  As directed      Increase activity slowly as tolerated    Complete by:  As directed            Follow-up Information    Follow up with Mcarthur Rossetti, MD In 2 weeks.   Specialty:  Orthopedic Surgery   Contact information:   [place] [address] [phone]        Signed: Mcarthur Rossetti 06/05/2016, 10:17 AM

## 2016-06-05 NOTE — Progress Notes (Signed)
Subjective: 1 Day Post-Op Procedure(s) (LRB): RIGHT TOTAL HIP ARTHROPLASTY ANTERIOR APPROACH (Right) Patient reports pain as mild.    Objective: Vital signs in last 24 hours: Temp:  [97.5 F (36.4 C)-98.7 F (37.1 C)] 98.7 F (37.1 C) (06/10 0907) Pulse Rate:  [67-92] 83 (06/10 0907) Resp:  [14-21] 18 (06/10 0907) BP: (113-168)/(68-95) 156/72 mmHg (06/10 0907) SpO2:  [96 %-100 %] 98 % (06/10 0907) Weight:  [77.565 kg (171 lb)] 77.565 kg (171 lb) (06/09 1600)  Intake/Output from previous day: 06/09 0701 - 06/10 0700 In: 3815 [P.O.:120; I.V.:3695] Out: [id number]; Blood:200] Intake/Output this shift: Total I/O In: 360 [P.O.:360] Out: -    Recent Labs  06/05/16 0400  HGB 12.9    Recent Labs  06/05/16 0400  WBC 12.7*  RBC 4.26  HCT 37.4  PLT 236    Recent Labs  06/05/16 0400  NA 137  K 4.2  CL 103  CO2 28  BUN 8  CREATININE 0.62  GLUCOSE 141*  CALCIUM 9.2   No results for input(s): LABPT, INR in the last 72 hours.  Sensation intact distally Intact pulses distally Dorsiflexion/Plantar flexion intact Incision: dressing C/D/I No cellulitis present Compartment soft  Assessment/Plan: 1 Day Post-Op Procedure(s) (LRB): RIGHT TOTAL HIP ARTHROPLASTY ANTERIOR APPROACH (Right) Up with therapy Discharge home with home health today.  Tresea Heine Y 06/05/2016, 10:15 AM

## 2016-06-05 NOTE — Op Note (Signed)
Angela Lin, AISPURO              ACCOUNT NO.:  [id number]  MEDICAL RECORD NO.:  [id number]  LOCATION:  [id number]                         FACILITY:  [facility]  PHYSICIAN:  Angela Lin, M.D.DATE OF BIRTH:  March 17, 1961  DATE OF PROCEDURE:  06/04/2016 DATE OF DISCHARGE:                              OPERATIVE REPORT   PREOPERATIVE DIAGNOSIS:  Severe osteoarthritis and degenerative joint disease of right hip.  POSTOPERATIVE DIAGNOSIS:  Severe osteoarthritis and degenerative joint disease of right hip.  PROCEDURE:  Right total hip arthroplasty through direct anterior approach.  IMPLANTS:  DePuy Sector Gription acetabular component size 50, size 32+ 4 neutral polyethylene liner, size 11 Corail femoral component with standard offset, size 32+ 5 ceramic hip ball.  SURGEON:  Angela Lin, M.D.  ASSISTANT:  Angela Emery, PA-C.  ANESTHESIA:  Spinal.  ANTIBIOTICS:  2 g of IV Ancef.  BLOOD LOSS:  200-250 mL.  COMPLICATIONS:  None.  INDICATIONS:  Angela Lin is a very pleasant 55 year old female, well known to me.  She has debilitating arthritis involving her right hip with complete loss of her joint space.  There are sclerotic and cystic changes, periarticular osteophytes, has significant shortening of her right hip comparing right to left.  At this point with the failure of conservative treatment, she wished to proceed with a total hip arthroplasty through direct anterior approach.  She understands the risk of acute blood loss anemia, nerve and vessel injury, fracture, infection, dislocation, and DVT.  She understands our goals are decreased pain, improved mobility, and overall improved quality of life.  PROCEDURE DESCRIPTION:  After informed consent was obtained, appropriate right hip was marked.  She was brought to the operating room where spinal anesthesia was obtained while she was on her stretcher.  Next, she was placed supine on the stretcher.  I did assess her  leg lengths and found her right leg to be shorter than left.  A Foley catheter was placed and then traction boots were placed on both of her feet.  Next, she was placed supine on the Hana fracture table with the perineal post in place and both legs in inline skeletal traction devices, but no traction applied.  Her right operative hip was then prepped and draped with DuraPrep and sterile drapes.  A time-out was called and she was identified as correct patient and correct right hip.  I then made an incision inferior and posterior to the anterior superior iliac spine and carried this obliquely down the leg.  I dissected down the tensor fascia lata muscle and the tensor fascia was then divided longitudinally, so we could proceed with a direct anterior approach to the hip.  We identified and cauterized the circumflex vessels.  I then identified the hip capsule, opened up the hip capsule in a large L-type format, finding a significant joint effusion and periarticular osteophytes.  We made our femoral neck cut proximal to the lesser trochanter with an oscillating saw and completed this with an osteotome, placed a corkscrew guide in the femoral head and removed the femoral head in its entirety, found to be completely devoid of cartilage.  We then passed it off the back table, placed a bent [employer]  over the medial acetabular rim and then used a sharp knife to debride remnants of the acetabular labrum.  We then began reaming under direct visualization from a size 42 reamer up to a size 50 with all reamers under direct visualization and stepwise increments and then the last reamer under direct fluoroscopy as well to obtain our depth of reaming, our inclination, and adduction.  Once we were pleased with this, we placed the real DePuy Sector acetabular component and what I felt was appropriate inclination and anteversion. We then placed a 32+ 4 polyethylene liner for that size acetabular component.   Attention was then turned to the femur.  With the leg externally rotated to 120 degrees, extended and adducted, we were able to place a Mueller retractor medially and a Hohmann retractor behind the greater trochanter.  I released the lateral joint capsule on the piriformis, used a rongeur to lateralize the box cutting osteotome to enter the femoral canal.  I then began broaching from a size 8 broach up to a size 11.  With the size 11 in place, we trialed and used a calcar planer and then trialed a standard neck with 32+ 1 hip ball.  We brought the leg back over and up with traction and internal rotation reducing the pelvis and we were pleased with stability, but we felt like we could gain little bit more offset and leg length.  So, we dislocated the hip and removed all trial components.  We placed the real Corail femoral component size 11 and the real 32+ 5 ceramic hip ball.  We reduced this in the acetabulum.  We were pleased with offset, leg length, range of motion and stability.  We then irrigated the soft tissue with normal saline solution.  We were able to close the joint capsule with interrupted #1 Ethibond suture followed by running #1 Vicryl in the tensor fascia, 0 Vicryl in the deep tissue, 2-0 Vicryl in the subcutaneous tissue, 4-0 Monocryl subcuticular stitch.  Steri-Strips were placed on the skin and Aquacel dressing was applied.  She was then taken off the Hana table, taken to the recovery room in stable condition.  All final counts were correct.  There were no complications noted.  Of note, Angela Emery, PA-C assisted in the entire case, his assistance was crucial for facilitating all aspects of this case.     Angela Lin, M.D.     CYB/MEDQ  D:  06/04/2016  T:  06/05/2016  Job:  [id number]

## 2016-06-05 NOTE — Discharge Instructions (Signed)

## 2016-06-06 DIAGNOSIS — M87052 Idiopathic aseptic necrosis of left femur: Secondary | ICD-10-CM | POA: Diagnosis not present

## 2016-06-06 DIAGNOSIS — Z7982 Long term (current) use of aspirin: Secondary | ICD-10-CM | POA: Diagnosis not present

## 2016-06-06 DIAGNOSIS — Z96641 Presence of right artificial hip joint: Secondary | ICD-10-CM | POA: Diagnosis not present

## 2016-06-06 DIAGNOSIS — Z471 Aftercare following joint replacement surgery: Secondary | ICD-10-CM | POA: Diagnosis not present

## 2016-06-06 DIAGNOSIS — Z87891 Personal history of nicotine dependence: Secondary | ICD-10-CM | POA: Diagnosis not present

## 2016-06-06 DIAGNOSIS — Z9181 History of falling: Secondary | ICD-10-CM | POA: Diagnosis not present

## 2016-06-08 DIAGNOSIS — Z9181 History of falling: Secondary | ICD-10-CM | POA: Diagnosis not present

## 2016-06-08 DIAGNOSIS — M87052 Idiopathic aseptic necrosis of left femur: Secondary | ICD-10-CM | POA: Diagnosis not present

## 2016-06-08 DIAGNOSIS — Z96641 Presence of right artificial hip joint: Secondary | ICD-10-CM | POA: Diagnosis not present

## 2016-06-08 DIAGNOSIS — Z7982 Long term (current) use of aspirin: Secondary | ICD-10-CM | POA: Diagnosis not present

## 2016-06-08 DIAGNOSIS — Z87891 Personal history of nicotine dependence: Secondary | ICD-10-CM | POA: Diagnosis not present

## 2016-06-08 DIAGNOSIS — Z471 Aftercare following joint replacement surgery: Secondary | ICD-10-CM | POA: Diagnosis not present

## 2016-06-10 DIAGNOSIS — Z7982 Long term (current) use of aspirin: Secondary | ICD-10-CM | POA: Diagnosis not present

## 2016-06-10 DIAGNOSIS — M87052 Idiopathic aseptic necrosis of left femur: Secondary | ICD-10-CM | POA: Diagnosis not present

## 2016-06-10 DIAGNOSIS — Z96641 Presence of right artificial hip joint: Secondary | ICD-10-CM | POA: Diagnosis not present

## 2016-06-10 DIAGNOSIS — Z471 Aftercare following joint replacement surgery: Secondary | ICD-10-CM | POA: Diagnosis not present

## 2016-06-10 DIAGNOSIS — Z87891 Personal history of nicotine dependence: Secondary | ICD-10-CM | POA: Diagnosis not present

## 2016-06-10 DIAGNOSIS — Z9181 History of falling: Secondary | ICD-10-CM | POA: Diagnosis not present

## 2016-06-16 DIAGNOSIS — Z87891 Personal history of nicotine dependence: Secondary | ICD-10-CM | POA: Diagnosis not present

## 2016-06-16 DIAGNOSIS — Z471 Aftercare following joint replacement surgery: Secondary | ICD-10-CM | POA: Diagnosis not present

## 2016-06-16 DIAGNOSIS — M87052 Idiopathic aseptic necrosis of left femur: Secondary | ICD-10-CM | POA: Diagnosis not present

## 2016-06-16 DIAGNOSIS — Z7982 Long term (current) use of aspirin: Secondary | ICD-10-CM | POA: Diagnosis not present

## 2016-06-16 DIAGNOSIS — Z9181 History of falling: Secondary | ICD-10-CM | POA: Diagnosis not present

## 2016-06-16 DIAGNOSIS — Z96641 Presence of right artificial hip joint: Secondary | ICD-10-CM | POA: Diagnosis not present

## 2016-06-17 DIAGNOSIS — M1611 Unilateral primary osteoarthritis, right hip: Secondary | ICD-10-CM | POA: Diagnosis not present

## 2016-06-18 DIAGNOSIS — Z96641 Presence of right artificial hip joint: Secondary | ICD-10-CM | POA: Diagnosis not present

## 2016-06-18 DIAGNOSIS — Z87891 Personal history of nicotine dependence: Secondary | ICD-10-CM | POA: Diagnosis not present

## 2016-06-18 DIAGNOSIS — Z9181 History of falling: Secondary | ICD-10-CM | POA: Diagnosis not present

## 2016-06-18 DIAGNOSIS — Z7982 Long term (current) use of aspirin: Secondary | ICD-10-CM | POA: Diagnosis not present

## 2016-06-18 DIAGNOSIS — M87052 Idiopathic aseptic necrosis of left femur: Secondary | ICD-10-CM | POA: Diagnosis not present

## 2016-06-18 DIAGNOSIS — Z471 Aftercare following joint replacement surgery: Secondary | ICD-10-CM | POA: Diagnosis not present

## 2016-11-03 DIAGNOSIS — E559 Vitamin D deficiency, unspecified: Secondary | ICD-10-CM | POA: Diagnosis not present

## 2016-11-03 DIAGNOSIS — Z13220 Encounter for screening for lipoid disorders: Secondary | ICD-10-CM | POA: Diagnosis not present

## 2016-11-03 DIAGNOSIS — Z131 Encounter for screening for diabetes mellitus: Secondary | ICD-10-CM | POA: Diagnosis not present

## 2016-11-03 DIAGNOSIS — Z23 Encounter for immunization: Secondary | ICD-10-CM | POA: Diagnosis not present

## 2016-11-03 DIAGNOSIS — M1811 Unilateral primary osteoarthritis of first carpometacarpal joint, right hand: Secondary | ICD-10-CM | POA: Diagnosis not present

## 2016-11-03 DIAGNOSIS — Z1159 Encounter for screening for other viral diseases: Secondary | ICD-10-CM | POA: Diagnosis not present

## 2016-11-03 DIAGNOSIS — Z130 Encounter for screening for diseases of the blood and blood-forming organs and certain disorders involving the immune mechanism: Secondary | ICD-10-CM | POA: Diagnosis not present

## 2016-11-03 DIAGNOSIS — F17210 Nicotine dependence, cigarettes, uncomplicated: Secondary | ICD-10-CM | POA: Diagnosis not present

## 2016-11-04 ENCOUNTER — Ambulatory Visit: Payer: Self-pay | Admitting: Obstetrics & Gynecology

## 2016-11-04 ENCOUNTER — Encounter: Payer: Self-pay | Admitting: Obstetrics & Gynecology

## 2016-11-04 VITALS — BP 114/88 | HR 64 | Resp 12 | Ht 65.5 in | Wt 173.6 lb

## 2016-11-04 DIAGNOSIS — Z01419 Encounter for gynecological examination (general) (routine) without abnormal findings: Secondary | ICD-10-CM | POA: Diagnosis not present

## 2016-11-04 DIAGNOSIS — Z1211 Encounter for screening for malignant neoplasm of colon: Secondary | ICD-10-CM | POA: Diagnosis not present

## 2016-11-04 DIAGNOSIS — Z124 Encounter for screening for malignant neoplasm of cervix: Secondary | ICD-10-CM | POA: Diagnosis not present

## 2016-11-04 DIAGNOSIS — Z23 Encounter for immunization: Secondary | ICD-10-CM | POA: Diagnosis not present

## 2016-11-04 DIAGNOSIS — Z0000 Encounter for general adult medical examination without abnormal findings: Secondary | ICD-10-CM | POA: Diagnosis not present

## 2016-11-04 LAB — POCT URINALYSIS DIPSTICK
BILIRUBIN UA: NEGATIVE
Blood, UA: NEGATIVE
GLUCOSE UA: NEGATIVE
Ketones, UA: NEGATIVE
LEUKOCYTES UA: NEGATIVE
NITRITE UA: NEGATIVE
Protein, UA: NEGATIVE
Urobilinogen, UA: NEGATIVE
pH, UA: 5

## 2016-11-04 MED ORDER — ASPIRIN EC 81 MG PO TBEC: 81.0000 mg | DELAYED_RELEASE_TABLET | Freq: Every day | ORAL | 0 refills | Status: DC

## 2016-11-04 NOTE — Progress Notes (Signed)
55 y.o. G0P0000 MarriedCaucasianF here for annual exam.  Establishing care again.  Denies vaginal bleeding.  Stopped cycling in 2009.  Had hip replacement in June with Dr. Ninfa Linden.  Pt feels she has done really well.       No LMP recorded. Patient is postmenopausal.          Sexually active: No.  The current method of family planning is post menopausal status.    Exercising: Yes.    walking  Smoker:  Yes- 1 pack per day   Health Maintenance: Pap:  ~ 8 years ago  History of abnormal Pap:  yes MMG:  2009 BIRADS 1 negative Colonoscopy:  never BMD:   never TDaP:  2008 in [place] Pneumonia vaccine(s):  2015  Zostavax:   none Hep C testing: patient thinks she had yesterday at [place] [facility]: labs yesterday at [place], Hb today: same, Urine today: normal    reports that she has been smoking Cigarettes.  She has been smoking about 1.00 pack per day. She has never used smokeless tobacco. She reports that she drinks alcohol. She reports that she does not use drugs.  Past Medical History:  Diagnosis Date  . Arthritis    osteoarthritis- hip  . Endometriosis   . Heart murmur    as a child    Past Surgical History:  Procedure Laterality Date  . DIAGNOSTIC LAPAROSCOPY     endometriosis- 20 yrs ago  . TOTAL HIP ARTHROPLASTY Right 06/04/2016   Procedure: RIGHT TOTAL HIP ARTHROPLASTY ANTERIOR APPROACH;  Surgeon: Mcarthur Rossetti, MD;  Location: [facility];  Service: Orthopedics;  Laterality: Right;    Current Outpatient Prescriptions  Medication Sig Dispense Refill  . aspirin EC 325 MG EC tablet Take 1 tablet (325 mg total) by mouth 2 (two) times daily after a meal. 30 tablet 0  . BIOTIN PO Take by mouth.    Marland Kitchen BLACK COHOSH PO Take 1 capsule by mouth at bedtime.    . Chlorpheniramine Maleate (ALLERGY PO) Take by mouth.    . DiphenhydrAMINE HCl (BENADRYL PO) Take by mouth.    Marland Kitchen glucosamine-chondroitin 500-400 MG tablet Take 1 tablet by mouth daily.     Marland Kitchen ibuprofen (ADVIL,MOTRIN) 200  MG tablet Take 600 mg by mouth every 6 (six) hours as needed (For pain.).    Marland Kitchen milk thistle 175 MG tablet Take 175 mg by mouth 2 (two) times daily.     Marland Kitchen neomycin-bacitracin-polymyxin (NEOSPORIN) ointment Apply 1 application topically at bedtime. Apply to lips.    . NON FORMULARY Triple greens    . Omega-3 Fatty Acids (OMEGA 3 PO) Take 1 capsule by mouth daily.    Marland Kitchen OVER THE COUNTER MEDICATION Place 2 drops into both eyes every morning. Allergy Eye Relief Drops    . Psyllium (SM FIBER POWDER PO) Take by mouth.    . Misc Natural Products (TART CHERRY ADVANCED PO) Take 2 capsules by mouth every morning.     No current facility-administered medications for this visit.     Family History  Problem Relation Age of Onset  . Cancer Father     Lung cancer- passed 2005     ROS:  Pertinent items are noted in HPI.  Otherwise, a comprehensive ROS was negative.  Exam:   BP 114/88 (BP Location: Right Arm, Patient Position: Sitting, Cuff Size: Normal)   Pulse 64   Resp 12   Ht 5' 5.5" (1.664 m)   Wt 173 lb 9.6 oz (78.7 kg)  BMI 28.45 kg/m     Height: 5' 5.5" (166.4 cm)  Ht Readings from Last 3 Encounters:  11/04/16 5' 5.5" (1.664 m)  06/04/16 5\' 5"  (1.651 m)  05/27/16 5' 5.5" (1.664 m)    General appearance: alert, cooperative and appears stated age Head: Normocephalic, without obvious abnormality, atraumatic Neck: no adenopathy, supple, symmetrical, trachea midline and thyroid normal to inspection and palpation Lungs: clear to auscultation bilaterally Breasts: normal appearance, no masses or tenderness Heart: regular rate and rhythm Abdomen: soft, non-tender; bowel sounds normal; no masses,  no organomegaly Extremities: extremities normal, atraumatic, no cyanosis or edema Skin: Skin color, texture, turgor normal. No rashes or lesions Lymph nodes: Cervical, supraclavicular, and axillary nodes normal. No abnormal inguinal nodes palpated Neurologic: Grossly normal   Pelvic: External  genitalia:  no lesions              Urethra:  normal appearing urethra with no masses, tenderness or lesions              Bartholins and Skenes: normal                 Vagina: normal appearing vagina with normal color and discharge, no lesions              Cervix: no lesions              Pap taken: Yes.   Bimanual Exam:  Uterus:  normal size, contour, position, consistency, mobility, non-tender              Adnexa: normal adnexa and no mass, fullness, tenderness               Rectovaginal: Confirms               Anus:  normal sphincter tone, no lesions  Chaperone was present for exam.  A:  Well Woman with normal exam PMP, no HRT Lapse of care for almost 10 years H/O endometriosis with laparoscopy   P:   Mammogram guidelines reviewed.  Information given.  Pt will schedule this. Labs with Dr. Addison Lank yesterday Pap and HR HPV obtained today IFOB given today TDap updated today Return annually or prn

## 2016-11-09 ENCOUNTER — Other Ambulatory Visit: Payer: Self-pay | Admitting: Obstetrics and Gynecology

## 2016-11-09 LAB — IPS PAP TEST WITH HPV

## 2016-11-09 NOTE — Addendum Note (Signed)
Addended by: Dorothy Spark on: 11/09/2016 04:34 PM   Modules accepted: Orders

## 2016-11-11 ENCOUNTER — Telehealth: Payer: Self-pay

## 2016-11-11 NOTE — Telephone Encounter (Signed)
Left message to call Angela Lin at [phone]. 

## 2016-11-11 NOTE — Telephone Encounter (Signed)
-----   Message from Salvadore Dom, MD sent at 11/09/2016  4:34 PM EST ----- Negative pap with +HPV and + BV Will add testing for 16/18 and will notify her when that is back with further recommendations Please inform the patient of above. If she is symptomatic for BV treat with flagyl (either oral or vaginal, her choice), no ETOH while on Flagyl.  Oral: Flagyl 500 mg BID x 7 days, or Vaginal: Metrogel, 1 applicator per vagina q day x 5 days. If not symptomatic, no treatment is needed

## 2016-11-12 LAB — IPS HPV GENOTYPING 16/18

## 2016-11-12 NOTE — Telephone Encounter (Signed)
Spoke with patient. Advised of results as seen below from [place]. Patient is agreeable and verbalizes understanding. Denies any current vaginal symptoms. No treatment needed at this time. Aware to return call to the office if she develops any symptoms. Aware she will be contacted with results from additional 16/18 testing.  Routing to provider for final review. Patient agreeable to disposition. Will close encounter.

## 2016-11-15 ENCOUNTER — Telehealth: Payer: Self-pay

## 2016-11-15 NOTE — Telephone Encounter (Signed)
-----   Message from Salvadore Dom, MD sent at 11/15/2016 10:12 AM EST ----- Please inform negative 16/18, recommend f/u pap with hpv in 1 year. Please inform patient and cc to Dr Sabra Heck

## 2016-11-15 NOTE — Telephone Encounter (Signed)
Left message to call Jaquin Coy at [phone]. 

## 2016-11-15 NOTE — Telephone Encounter (Signed)
Spoke with patient. Advised of results as seen below from [place]. Patient is agreeable and verbalizes understanding. 08 recall placed.  Cc: Dr.Miller  Routing to provider for final review. Patient agreeable to disposition. Will close encounter.

## 2016-11-22 ENCOUNTER — Other Ambulatory Visit: Payer: Self-pay | Admitting: Obstetrics & Gynecology

## 2016-11-22 DIAGNOSIS — Z1231 Encounter for screening mammogram for malignant neoplasm of breast: Secondary | ICD-10-CM

## 2016-12-17 ENCOUNTER — Ambulatory Visit
Admission: RE | Admit: 2016-12-17 | Discharge: 2016-12-17 | Disposition: A | Discharge status: Home / Self Care | Payer: BLUE CROSS/BLUE SHIELD | Source: Ambulatory Visit | Attending: Obstetrics & Gynecology | Admitting: Obstetrics & Gynecology

## 2016-12-17 DIAGNOSIS — Z1231 Encounter for screening mammogram for malignant neoplasm of breast: Secondary | ICD-10-CM | POA: Diagnosis not present

## 2016-12-24 ENCOUNTER — Other Ambulatory Visit: Payer: Self-pay | Admitting: Obstetrics & Gynecology

## 2016-12-24 DIAGNOSIS — R928 Other abnormal and inconclusive findings on diagnostic imaging of breast: Secondary | ICD-10-CM

## 2016-12-30 ENCOUNTER — Ambulatory Visit
Admission: RE | Admit: 2016-12-30 | Discharge: 2016-12-30 | Disposition: A | Discharge status: Home / Self Care | Payer: BLUE CROSS/BLUE SHIELD | Source: Ambulatory Visit | Attending: Obstetrics & Gynecology | Admitting: Obstetrics & Gynecology

## 2016-12-30 ENCOUNTER — Other Ambulatory Visit: Payer: BLUE CROSS/BLUE SHIELD

## 2016-12-30 ENCOUNTER — Ambulatory Visit: Payer: BLUE CROSS/BLUE SHIELD

## 2016-12-30 DIAGNOSIS — R928 Other abnormal and inconclusive findings on diagnostic imaging of breast: Secondary | ICD-10-CM

## 2016-12-30 DIAGNOSIS — R922 Inconclusive mammogram: Secondary | ICD-10-CM | POA: Diagnosis not present

## 2016-12-30 DIAGNOSIS — N6489 Other specified disorders of breast: Secondary | ICD-10-CM | POA: Diagnosis not present

## 2017-04-05 ENCOUNTER — Telehealth: Payer: Self-pay | Admitting: *Deleted

## 2017-04-05 NOTE — Telephone Encounter (Signed)
Internal QA audit complete. Due for repeat pap November 2018. Annual scheduled for 02-23-18. Call to patient to move up annual. Left message to call back.

## 2017-04-12 NOTE — Telephone Encounter (Signed)
Call to patient. Left message to call back to [place] or [place]. Calling to offer annual in [facility] 2018.

## 2017-04-13 NOTE — Telephone Encounter (Signed)
Routing to provider for final review. Patient agreeable to disposition. Will close encounter.     

## 2017-04-13 NOTE — Telephone Encounter (Signed)
Patient returned call and rescheduled to 11/10/17 with Dr. Sabra Heck for her AEX. Patient appreciative.  Routing back to [place] to close encounter if complete.

## 2017-06-13 ENCOUNTER — Ambulatory Visit (INDEPENDENT_AMBULATORY_CARE_PROVIDER_SITE_OTHER): Payer: Self-pay | Admitting: Orthopaedic Surgery

## 2017-06-20 IMAGING — MG 2D DIGITAL SCREENING BILATERAL MAMMOGRAM WITH CAD AND ADJUNCT TO
8 of 12 series · 8 of 28 positions shown · non-contrast
Comparison: Previous exam(s).

CLINICAL DATA: Screening.

EXAM:
2D DIGITAL SCREENING BILATERAL MAMMOGRAM WITH CAD AND ADJUNCT TOMO

[L CC synth-2D]
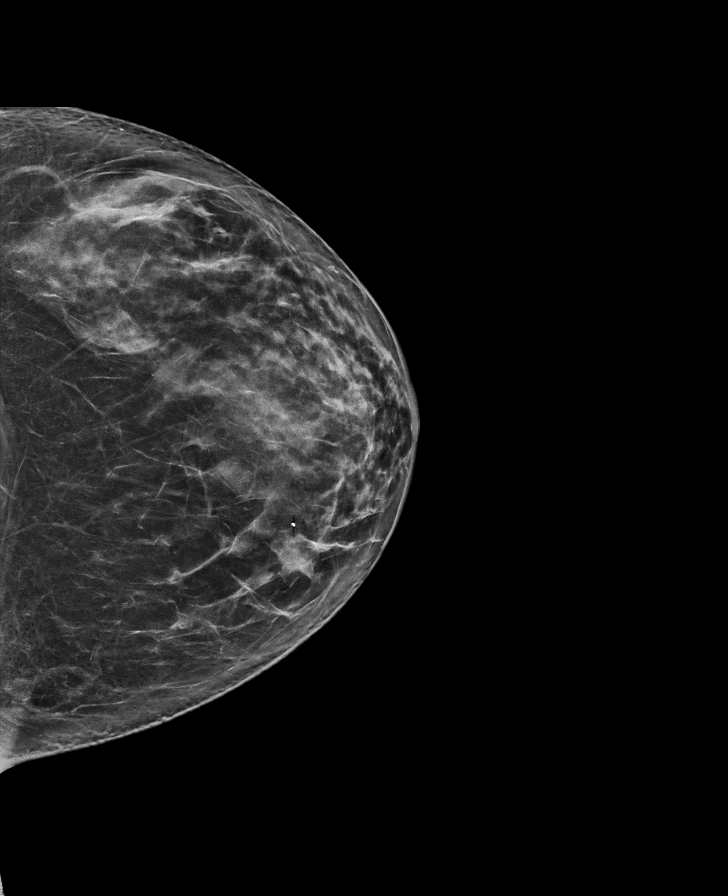

[R MLO]
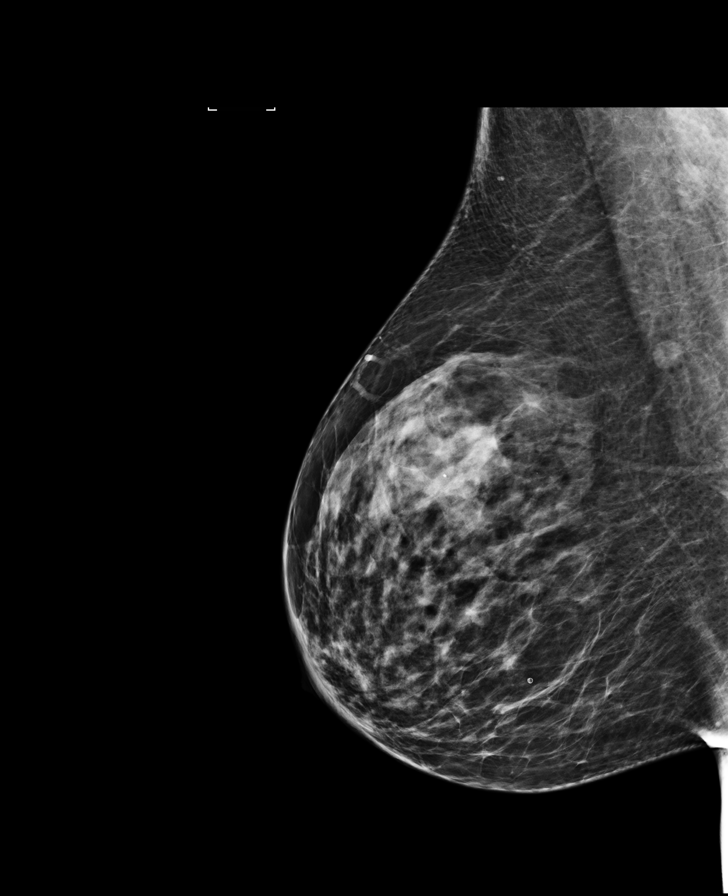

[R CC]
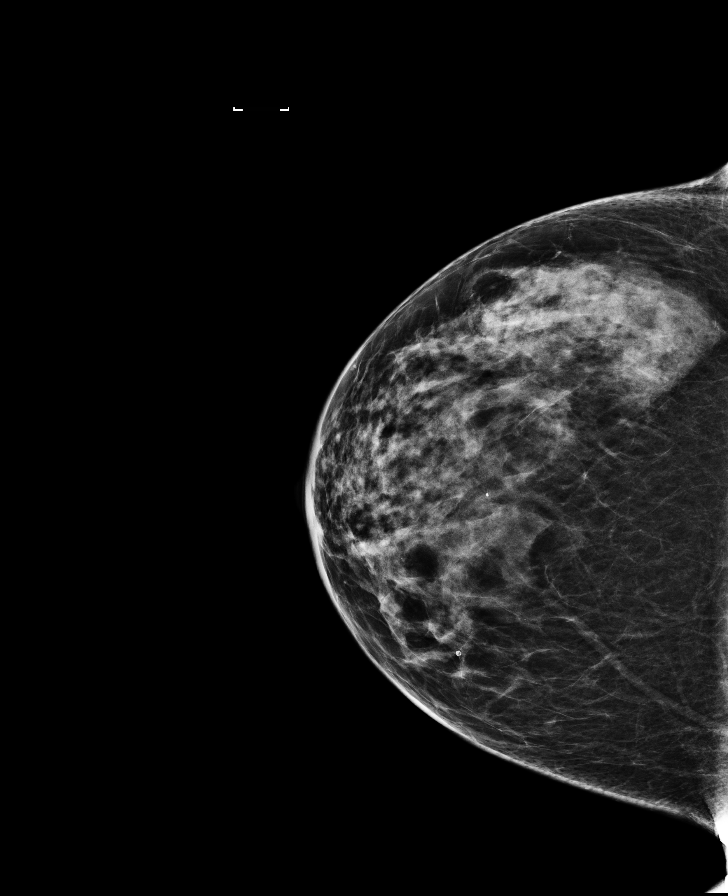

[L MLO synth-2D]
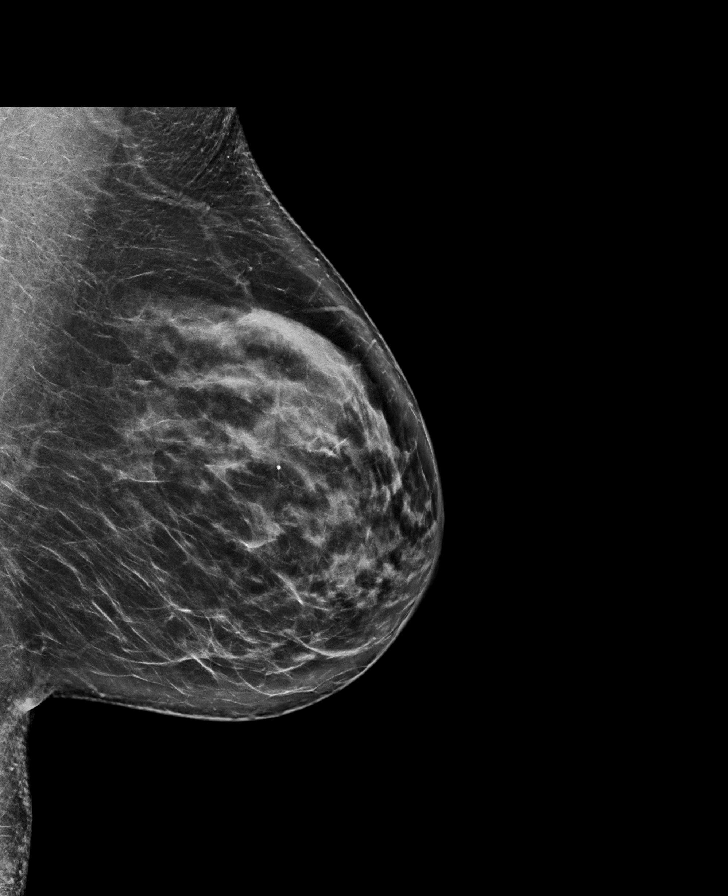

[R CC synth-2D]
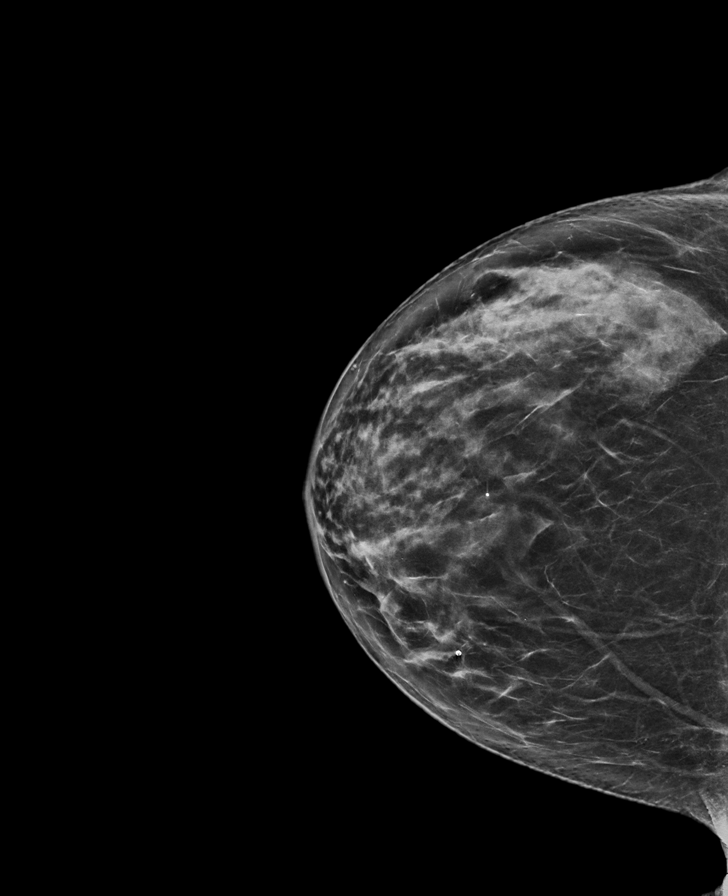

[L CC]
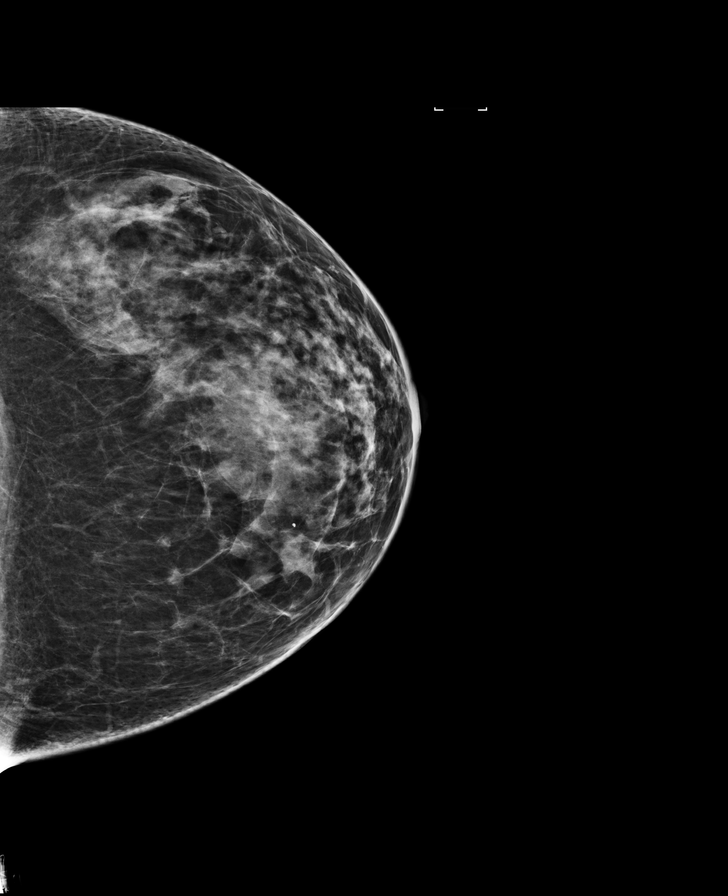

[R MLO synth-2D]
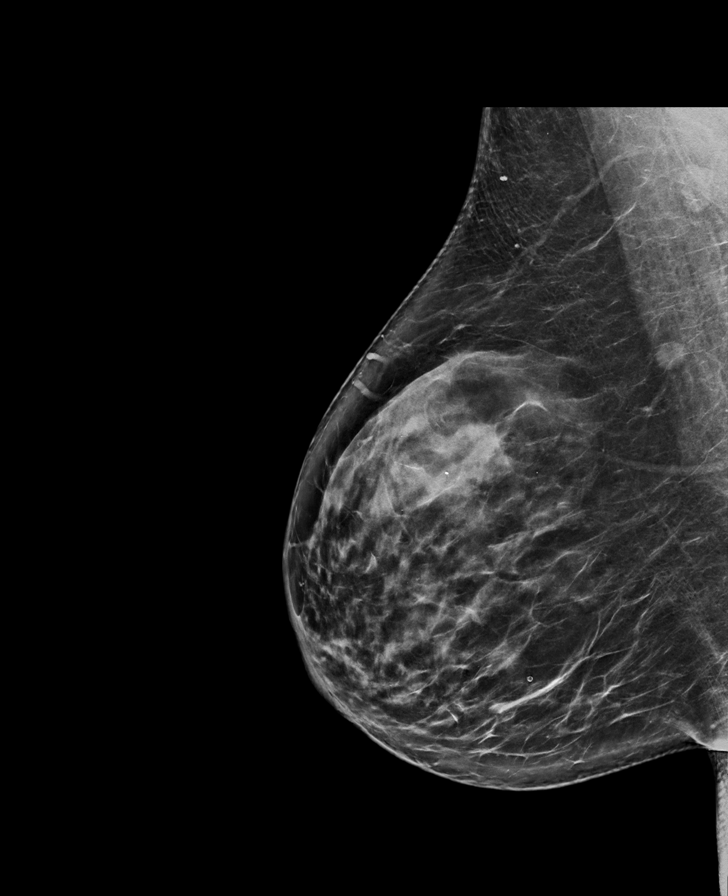

[L MLO]
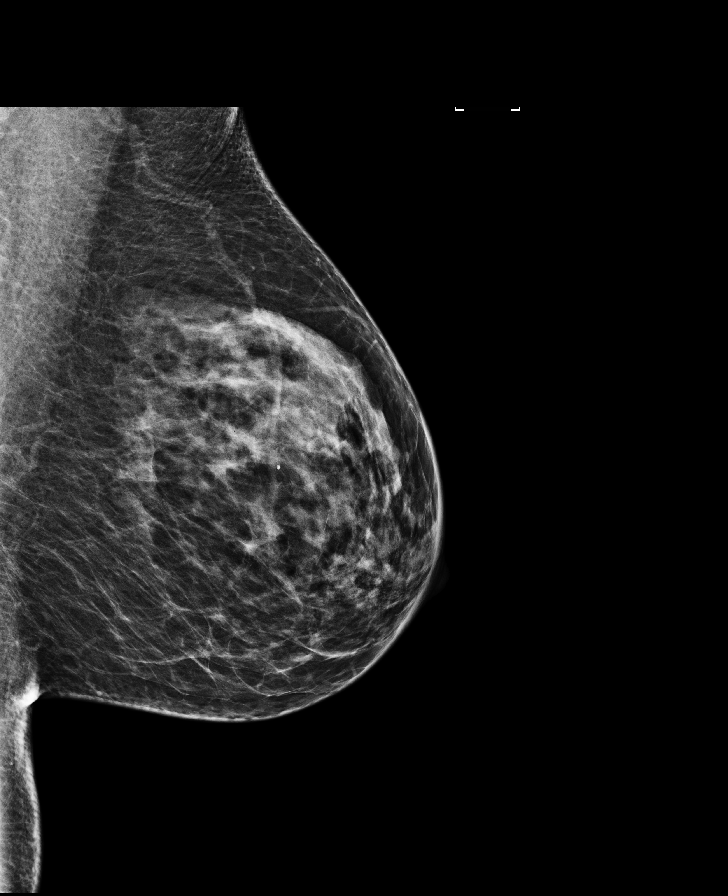

[8 of 28 positions shown; findings below may reference images not displayed]

ACR Breast Density Category c: The breast tissue is heterogeneously
dense, which may obscure small masses.
FINDINGS: In the left breast/axilla, a possible mass warrants further
evaluation. This mass is partially imaged in the upper left
breast/axilla, at far posterior depth, on the MLO projection only.

In the right breast, no findings suspicious for malignancy.

Images were processed with CAD.
IMPRESSION: Further evaluation is suggested for possible mass in the left
breast/axilla.

RECOMMENDATION:
Diagnostic mammogram, including exaggerated CC view, and possibly
ultrasound of the left breast/axilla. (Code:[id number])

The patient will be contacted regarding the findings, and additional
imaging will be scheduled.

BI-RADS CATEGORY  0: Incomplete. Need additional imaging evaluation
and/or prior mammograms for comparison.

## 2017-10-05 DIAGNOSIS — J0100 Acute maxillary sinusitis, unspecified: Secondary | ICD-10-CM | POA: Diagnosis not present

## 2017-11-08 NOTE — Progress Notes (Signed)
56 y.o. G0P0000 MarriedCaucasianF here for annual exam.  Doing really well.  Has done great since hip surgery.    Denies vaginal bleeding.      Pt very anxious this morning.  Going to [facility] with her 63 mother.  PCP:  Dr. Addison Lank.  Seeing 11/21/17  Patient's last menstrual period was 08/27/2008 (approximate).          Sexually active: No.  The current method of family planning is post menopausal status.    Exercising: Yes.    walking Smoker:  yes  Health Maintenance: Pap:  11/04/16 Neg. HR HPV+Detected. #16, 18/45 Neg.  History of abnormal Pap:  yes MMG: 12/30/16 [place] left BIRADS2:Benign. Screening MMG due 11/2018 Colonoscopy:  Never. BMD:   Never TDaP:  2017  Pneumonia vaccine(s):  2015 Zostavax:   No Hep C testing: 11/03/16 Neg  Screening Labs: PCP   reports that she has been smoking cigarettes.  She has been smoking about 1.00 pack per day. she has never used smokeless tobacco. She reports that she drinks alcohol. She reports that she does not use drugs.  Past Medical History:  Diagnosis Date  . Arthritis    osteoarthritis- hip  . Endometriosis   . Heart murmur    as a child    Past Surgical History:  Procedure Laterality Date  . DIAGNOSTIC LAPAROSCOPY     endometriosis- 20 yrs ago  . TOTAL HIP ARTHROPLASTY Right 06/04/2016   Procedure: RIGHT TOTAL HIP ARTHROPLASTY ANTERIOR APPROACH;  Surgeon: Mcarthur Rossetti, MD;  Location: [facility];  Service: Orthopedics;  Laterality: Right;    Current Outpatient Medications  Medication Sig Dispense Refill  . aspirin 81 MG tablet Take 1 tablet (81 mg total) by mouth at bedtime. 30 tablet 0  . BIOTIN PO Take by mouth.    Marland Kitchen BLACK COHOSH PO Take 1 capsule by mouth at bedtime.    . Chlorpheniramine Maleate (ALLERGY PO) Take by mouth.    . DiphenhydrAMINE HCl (BENADRYL PO) Take by mouth.    Marland Kitchen glucosamine-chondroitin 500-400 MG tablet Take 1 tablet by mouth daily.     Marland Kitchen ibuprofen (ADVIL,MOTRIN) 200 MG tablet Take 600 mg by mouth every 6  (six) hours as needed (For pain.).    Marland Kitchen milk thistle 175 MG tablet Take 175 mg by mouth 2 (two) times daily.     Marland Kitchen neomycin-bacitracin-polymyxin (NEOSPORIN) ointment Apply 1 application topically at bedtime. Apply to lips.    . NON FORMULARY Triple greens    . OVER THE COUNTER MEDICATION Place 2 drops into both eyes every morning. Allergy Eye Relief Drops    . Psyllium (SM FIBER POWDER PO) Take by mouth.     No current facility-administered medications for this visit.     Family History  Problem Relation Age of Onset  . Cancer Father        Lung cancer- passed 2005     ROS:  Pertinent items are noted in HPI.  Otherwise, a comprehensive ROS was negative.  Exam:   BP (!) 150/92 (BP Location: Right Arm, Patient Position: Sitting, Cuff Size: Large)   Pulse 86   Resp 18   Ht 5' 4.75" (1.645 m)   Wt 169 lb (76.7 kg)   LMP 08/27/2008 (Approximate)   BMI 28.34 kg/m   Weight change: +4#   Height: 5' 4.75" (164.5 cm)  Ht Readings from Last 3 Encounters:  11/10/17 5' 4.75" (1.645 m)  11/04/16 5' 5.5" (1.664 m)  06/04/16 5\' 5"  (1.651  m)   General appearance: alert, cooperative and appears stated age Head: Normocephalic, without obvious abnormality, atraumatic Neck: no adenopathy, supple, symmetrical, trachea midline and thyroid normal to inspection and palpation Lungs: clear to auscultation bilaterally Breasts: normal appearance, no masses or tenderness Heart: regular rate and rhythm Abdomen: soft, non-tender; bowel sounds normal; no masses,  no organomegaly Extremities: extremities normal, atraumatic, no cyanosis or edema Skin: Skin color, texture, turgor normal. No rashes or lesions Lymph nodes: Cervical, supraclavicular, and axillary nodes normal. No abnormal inguinal nodes palpated Neurologic: Grossly normal   Pelvic: External genitalia:  no lesions              Urethra:  normal appearing urethra with no masses, tenderness or lesions              Bartholins and Skenes: normal                  Vagina: normal appearing vagina with normal color and discharge, no lesions              Cervix: no lesions              Pap taken: Yes.   Bimanual Exam:  Uterus:  normal size, contour, position, consistency, mobility, non-tender              Adnexa: normal adnexa and no mass, fullness, tenderness               Rectovaginal: Confirms               Anus:  normal sphincter tone, no lesions  Chaperone was present for exam.  A:  Well Woman with normal exam PMP, no HRT H/O endometriosis with laparoscopy  P:   Mammogram guidelines reviewed pap smear with HR HPV obtained today Has appt with Dr. Addison Lank in two weeks.  Will do blood work then. Cologuard discussed.  Willing to do this.  Declines colonoscopy. D/w pt Shingrix vaccination.  Wants to talk with Dr. Addison Lank about this as well. return annually or prn

## 2017-11-10 ENCOUNTER — Other Ambulatory Visit: Payer: Self-pay

## 2017-11-10 ENCOUNTER — Encounter: Payer: Self-pay | Admitting: Obstetrics & Gynecology

## 2017-11-10 ENCOUNTER — Other Ambulatory Visit (HOSPITAL_COMMUNITY)
Admission: RE | Admit: 2017-11-10 | Discharge: 2017-11-10 | Disposition: A | Discharge status: Home / Self Care | Payer: BLUE CROSS/BLUE SHIELD | Source: Ambulatory Visit | Attending: Obstetrics & Gynecology | Admitting: Obstetrics & Gynecology

## 2017-11-10 ENCOUNTER — Ambulatory Visit: Payer: BLUE CROSS/BLUE SHIELD | Admitting: Obstetrics & Gynecology

## 2017-11-10 ENCOUNTER — Ambulatory Visit (INDEPENDENT_AMBULATORY_CARE_PROVIDER_SITE_OTHER): Payer: BLUE CROSS/BLUE SHIELD | Admitting: Obstetrics & Gynecology

## 2017-11-10 VITALS — BP 130/80 | HR 86 | Resp 18 | Ht 64.75 in | Wt 169.0 lb

## 2017-11-10 DIAGNOSIS — Z01419 Encounter for gynecological examination (general) (routine) without abnormal findings: Secondary | ICD-10-CM | POA: Diagnosis not present

## 2017-11-10 DIAGNOSIS — Z124 Encounter for screening for malignant neoplasm of cervix: Secondary | ICD-10-CM | POA: Insufficient documentation

## 2017-11-10 DIAGNOSIS — B977 Papillomavirus as the cause of diseases classified elsewhere: Secondary | ICD-10-CM

## 2017-11-10 DIAGNOSIS — Z1211 Encounter for screening for malignant neoplasm of colon: Secondary | ICD-10-CM

## 2017-11-11 LAB — CYTOLOGY - PAP
Diagnosis: NEGATIVE
HPV: NOT DETECTED

## 2017-11-21 DIAGNOSIS — Z0001 Encounter for general adult medical examination with abnormal findings: Secondary | ICD-10-CM | POA: Diagnosis not present

## 2017-11-21 DIAGNOSIS — Z23 Encounter for immunization: Secondary | ICD-10-CM | POA: Diagnosis not present

## 2017-11-21 DIAGNOSIS — J208 Acute bronchitis due to other specified organisms: Secondary | ICD-10-CM | POA: Diagnosis not present

## 2017-11-21 DIAGNOSIS — E559 Vitamin D deficiency, unspecified: Secondary | ICD-10-CM | POA: Diagnosis not present

## 2017-11-21 DIAGNOSIS — Z13220 Encounter for screening for lipoid disorders: Secondary | ICD-10-CM | POA: Diagnosis not present

## 2017-11-21 DIAGNOSIS — E871 Hypo-osmolality and hyponatremia: Secondary | ICD-10-CM | POA: Diagnosis not present

## 2017-11-21 DIAGNOSIS — R030 Elevated blood-pressure reading, without diagnosis of hypertension: Secondary | ICD-10-CM | POA: Diagnosis not present

## 2017-12-08 DIAGNOSIS — R030 Elevated blood-pressure reading, without diagnosis of hypertension: Secondary | ICD-10-CM | POA: Diagnosis not present

## 2017-12-08 DIAGNOSIS — F17210 Nicotine dependence, cigarettes, uncomplicated: Secondary | ICD-10-CM | POA: Diagnosis not present

## 2018-02-23 ENCOUNTER — Ambulatory Visit: Payer: BLUE CROSS/BLUE SHIELD | Admitting: Obstetrics & Gynecology

## 2018-07-31 DIAGNOSIS — F17210 Nicotine dependence, cigarettes, uncomplicated: Secondary | ICD-10-CM | POA: Diagnosis not present

## 2018-07-31 DIAGNOSIS — J0140 Acute pansinusitis, unspecified: Secondary | ICD-10-CM | POA: Diagnosis not present

## 2018-07-31 DIAGNOSIS — M25552 Pain in left hip: Secondary | ICD-10-CM | POA: Diagnosis not present

## 2018-08-01 ENCOUNTER — Encounter (INDEPENDENT_AMBULATORY_CARE_PROVIDER_SITE_OTHER): Payer: Self-pay | Admitting: Physician Assistant

## 2018-08-01 ENCOUNTER — Ambulatory Visit (INDEPENDENT_AMBULATORY_CARE_PROVIDER_SITE_OTHER): Payer: BLUE CROSS/BLUE SHIELD | Admitting: Physician Assistant

## 2018-08-01 ENCOUNTER — Ambulatory Visit (INDEPENDENT_AMBULATORY_CARE_PROVIDER_SITE_OTHER): Payer: BLUE CROSS/BLUE SHIELD

## 2018-08-01 DIAGNOSIS — M1612 Unilateral primary osteoarthritis, left hip: Secondary | ICD-10-CM | POA: Diagnosis not present

## 2018-08-01 NOTE — Progress Notes (Signed)
Office Visit Note   Patient: Angela Lin           Date of Birth: March 02, 1961           MRN: [id number] Visit Date: 08/01/2018              Requested by: Cari Caraway, [place], [place] [address] PCP: Cari Caraway, MD   Assessment & Plan: Visit Diagnoses:  1. Primary osteoarthritis of left hip     Plan: We will set her up for an intra-articular injection of the left hip.  She understands that she can only have injections in the left hip every 6 months.  She will follow-up Dr. Ninfa Linden 4 weeks after the injection check progress lack of.  Questions were encouraged and answered at length.  Follow-Up Instructions: Return in about 1 month (around 08/29/2018).   Orders:  Orders Placed This Encounter  Procedures  . XR HIP UNILAT W OR W/O PELVIS 1V LEFT   No orders of the defined types were placed in this encounter.     Procedures: No procedures performed   Clinical Data: No additional findings.   Subjective: Chief Complaint  Patient presents with  . Left Hip - Pain    HPI Angela Lin  is a 57 year old female comes in today with left hip pain.  She is well-known to Dr. Ninfa Linden service underwent a right total hip arthroplasty 06/04/2016.  Right hip is doing well.  She states she is having cramping in the hip at all times.  She notes she has some discomfort in her buttocks region on the left.  No groin pain.  No radicular symptoms down the leg.  States it feels kind of like her right hip did before she had surgery.  She is having no new difficulty with donning shoes or socks on the left she states she is an [place] would do this for years. Review of Systems Please see HPI otherwise negative  Objective: Vital Signs: LMP 08/27/2008 (Approximate)   Physical Exam  Constitutional: She is oriented to person, place, and time. She appears well-developed and well-nourished. No distress.  Cardiovascular: Intact distal pulses.  Pulmonary/Chest: Effort  normal.  Neurological: She is alert and oriented to person, place, and time.  Skin: She is not diaphoretic.  Psychiatric: She has a normal mood and affect.    Ortho Exam Right hip fluid range of motion without pain.  Left hip she has decreased internal rotation with slight discomfort.  External rotation is full.  She has slight tenderness of the left greater greater trochanteric region. Specialty Comments:  No specialty comments available.  Imaging: Xr Hip Unilat W Or W/o Pelvis 1v Left  Result Date: 08/01/2018 AP pelvis and lateral view of the left hip shows the right total hip arthroplasty be in good position and well-seated.  Left hip is well located.  Left hip with end-stage osteoarthritis with osteophytes of the superior and inferior aspect of the acetabular head.  Lateral view of the hip shows flattening of the left femoral head.    PMFS History: Patient Active Problem List   Diagnosis Date Noted  . Osteoarthritis of right hip 06/04/2016  . Avascular necrosis of bones of both hips ([place]) 06/04/2016  . Status post total replacement of right hip 06/04/2016   Past Medical History:  Diagnosis Date  . Arthritis    osteoarthritis- hip  . Endometriosis   . Heart murmur    as a child  Family History  Problem Relation Age of Onset  . Cancer Father        Lung cancer- passed 2005     Past Surgical History:  Procedure Laterality Date  . DIAGNOSTIC LAPAROSCOPY     endometriosis- 20 yrs ago  . TOTAL HIP ARTHROPLASTY Right 06/04/2016   Procedure: RIGHT TOTAL HIP ARTHROPLASTY ANTERIOR APPROACH;  Surgeon: Mcarthur Rossetti, MD;  Location: [facility];  Service: Orthopedics;  Laterality: Right;   Social History   Occupational History  . Not on file  Tobacco Use  . Smoking status: Current Every Day Smoker    Packs/day: 1.00    Types: Cigarettes  . Smokeless tobacco: Never Used  . Tobacco comment: past was able to quit- now smoking 0.5 ppd  Substance and Sexual Activity  .  Alcohol use: Yes    Comment: 2-3 times weekly  . Drug use: No  . Sexual activity: Not Currently    Birth control/protection: Post-menopausal, Abstinence

## 2018-08-02 ENCOUNTER — Other Ambulatory Visit (INDEPENDENT_AMBULATORY_CARE_PROVIDER_SITE_OTHER): Payer: Self-pay

## 2018-08-02 DIAGNOSIS — M25552 Pain in left hip: Secondary | ICD-10-CM

## 2018-08-16 ENCOUNTER — Encounter (INDEPENDENT_AMBULATORY_CARE_PROVIDER_SITE_OTHER): Payer: Self-pay | Admitting: Physical Medicine and Rehabilitation

## 2018-08-16 ENCOUNTER — Ambulatory Visit (INDEPENDENT_AMBULATORY_CARE_PROVIDER_SITE_OTHER): Payer: Self-pay

## 2018-08-16 ENCOUNTER — Ambulatory Visit (INDEPENDENT_AMBULATORY_CARE_PROVIDER_SITE_OTHER): Payer: BLUE CROSS/BLUE SHIELD | Admitting: Physical Medicine and Rehabilitation

## 2018-08-16 DIAGNOSIS — M25552 Pain in left hip: Secondary | ICD-10-CM | POA: Diagnosis not present

## 2018-08-16 NOTE — Patient Instructions (Signed)

## 2018-08-16 NOTE — Progress Notes (Signed)
 .  Numeric Pain Rating Scale and Functional Assessment Average Pain 3   In the last MONTH (on 0-10 scale) has pain interfered with the following?  1. General activity like being  able to carry out your everyday physical activities such as walking, climbing stairs, carrying groceries, or moving a chair?  Rating(1)   -Dye Allergies.

## 2018-08-16 NOTE — Progress Notes (Signed)
Angela Lin - 57 y.o. female MRN [id number]  Date of birth: 1961-05-26  Office Visit Note: Visit Date: 08/16/2018 PCP: Cari Caraway, MD Referred by: Cari Caraway, MD  Subjective: Chief Complaint  Patient presents with  . Left Hip - Pain   HPI: Angela Lin is a 57 year old female who comes in today at the request of Benita Stabile, P.A.-C for diagnostic note for therapeutic anesthetic hip arthrogram on the left.  Patient status post right total hip arthroplasty in 2017.  She reports her left sided symptoms started in 2017 as well.  She says walking and yard work make her pain worse.  She does use Aspercreme and that does seem to help.  Her average pain is a 3 out of 10 but she has certain movements which are really difficult.  She will get a sharp pain at times with certain movements.   ROS Otherwise per HPI.  Assessment & Plan: Visit Diagnoses:  1. Pain in left hip     Plan: Findings:  Diagnostic note for therapeutic anesthetic hip arthrogram on the left.  Patient did have relief during the anesthetic phase of the injection.    Meds & Orders: No orders of the defined types were placed in this encounter.   Orders Placed This Encounter  Procedures  . Large Joint Inj: L hip joint  . XR C-ARM NO REPORT    Follow-up: Return if symptoms worsen or fail to improve.   Procedures: Large Joint Inj: L hip joint on 08/16/2018 11:14 AM Indications: pain and diagnostic evaluation Details: 22 G needle, anterior approach  Arthrogram: Yes  Medications: 80 mg triamcinolone acetonide 40 MG/ML; 3 mL bupivacaine 0.5 % Outcome: tolerated well, no immediate complications  Arthrogram demonstrated excellent flow of contrast throughout the joint surface without extravasation or obvious defect.  The patient had relief of symptoms during the anesthetic phase of the injection.  Procedure, treatment alternatives, risks and benefits explained, specific risks discussed. Consent was given by the  patient. Immediately prior to procedure a time out was called to verify the correct patient, procedure, equipment, support staff and site/side marked as required. Patient was prepped and draped in the usual sterile fashion.      No notes on file   Clinical History: No specialty comments available.   She reports that she has been smoking cigarettes. She has been smoking about 1.00 pack per day. She has never used smokeless tobacco. No results for input(s): HGBA1C, LABURIC in the last 8760 hours.  Objective:  VS:  HT:    WT:   BMI:     BP:   HR: bpm  TEMP: ( )  RESP:  Physical Exam  Ortho Exam Imaging: Xr C-arm No Report  Result Date: 08/16/2018 Please see Notes tab for imaging impression.   Past Medical/Family/Surgical/Social History: Medications & Allergies reviewed per EMR, new medications updated. Patient Active Problem List   Diagnosis Date Noted  . Osteoarthritis of right hip 06/04/2016  . Avascular necrosis of bones of both hips ([place]) 06/04/2016  . Status post total replacement of right hip 06/04/2016   Past Medical History:  Diagnosis Date  . Arthritis    osteoarthritis- hip  . Endometriosis   . Heart murmur    as a child   Family History  Problem Relation Age of Onset  . Cancer Father        Lung cancer- passed 2005    Past Surgical History:  Procedure Laterality Date  . DIAGNOSTIC LAPAROSCOPY  endometriosis- 20 yrs ago  . TOTAL HIP ARTHROPLASTY Right 06/04/2016   Procedure: RIGHT TOTAL HIP ARTHROPLASTY ANTERIOR APPROACH;  Surgeon: Mcarthur Rossetti, MD;  Location: [facility];  Service: Orthopedics;  Laterality: Right;   Social History   Occupational History  . Not on file  Tobacco Use  . Smoking status: Current Every Day Smoker    Packs/day: 1.00    Types: Cigarettes  . Smokeless tobacco: Never Used  . Tobacco comment: past was able to quit- now smoking 0.5 ppd  Substance and Sexual Activity  . Alcohol use: Yes    Comment: 2-3 times weekly    . Drug use: No  . Sexual activity: Not Currently    Birth control/protection: Post-menopausal, Abstinence

## 2018-08-17 MED ORDER — BUPIVACAINE HCL 0.5 % IJ SOLN
3.0000 mL | INTRAMUSCULAR | Status: AC | PRN
  Administered 2018-08-16: 3 mL via INTRA_ARTICULAR

## 2018-08-17 MED ORDER — TRIAMCINOLONE ACETONIDE 40 MG/ML IJ SUSP
80.0000 mg | INTRAMUSCULAR | Status: AC | PRN
Start: 2018-08-16 — End: 2018-08-16
  Administered 2018-08-16: 80 mg via INTRA_ARTICULAR

## 2018-11-14 DIAGNOSIS — R040 Epistaxis: Secondary | ICD-10-CM | POA: Diagnosis not present

## 2018-11-20 ENCOUNTER — Ambulatory Visit (INDEPENDENT_AMBULATORY_CARE_PROVIDER_SITE_OTHER): Payer: BLUE CROSS/BLUE SHIELD | Admitting: Physician Assistant

## 2018-11-21 ENCOUNTER — Other Ambulatory Visit (INDEPENDENT_AMBULATORY_CARE_PROVIDER_SITE_OTHER): Payer: Self-pay | Admitting: Physician Assistant

## 2018-12-01 NOTE — Patient Instructions (Signed)
Angela Lin  12/01/2018   Your procedure is scheduled on: 12-08-18   Report to [facility] Main  Entrance    Report to admitting at 11:00AM    Call this number if you have problems the morning of surgery [phone]     Remember: Do not eat food After Midnight. YOU MAY HAVE CLEAR LIQUIDS FROM MIDNIGHT UNTIL 7:30AM. NOTHING BY MOUTH AFTER 7:30AM! BRUSH YOUR TEETH MORNING OF SURGERY AND RINSE YOUR MOUTH OUT, NO CHEWING GUM CANDY OR MINTS.        CLEAR LIQUID DIET   Foods Allowed                                                                     Foods Excluded  Coffee and tea, regular and decaf                             liquids that you cannot  Plain Jell-O in any flavor                                             see through such as: Fruit ices (not with fruit pulp)                                     milk, soups, orange juice  Iced Popsicles                                    All solid food Carbonated beverages, regular and diet                                    Cranberry, grape and apple juices Sports drinks like Gatorade Lightly seasoned clear broth or consume(fat free) Sugar, honey syrup  Sample Menu Breakfast                                Lunch                                     Supper Cranberry juice                    Beef broth                            Chicken broth Jell-O                                     Grape juice  Apple juice Coffee or tea                        Jell-O                                      Popsicle                                                Coffee or tea                        Coffee or tea  _____________________________________________________________________     Take these medicines the morning of surgery with A SIP OF WATER: none                                You may not have any metal on your body including hair pins and              piercings  Do not wear  jewelry, make-up, lotions, powders or perfumes, deodorant             Do not wear nail polish.  Do not shave  48 hours prior to surgery.              Do not bring valuables to the hospital. [place].  Contacts, dentures or bridgework may not be worn into surgery.  Leave suitcase in the car. After surgery it may be brought to your room.                 Please read over the following fact sheets you were given: _____________________________________________________________________             [facility] - Preparing for Surgery Before surgery, you can play an important role.  Because skin is not sterile, your skin needs to be as free of germs as possible.  You can reduce the number of germs on your skin by washing with CHG (chlorahexidine gluconate) soap before surgery.  CHG is an antiseptic cleaner which kills germs and bonds with the skin to continue killing germs even after washing. Please DO NOT use if you have an allergy to CHG or antibacterial soaps.  If your skin becomes reddened/irritated stop using the CHG and inform your nurse when you arrive at Short Stay. Do not shave (including legs and underarms) for at least 48 hours prior to the first CHG shower.  You may shave your face/neck. Please follow these instructions carefully:  1.  Shower with CHG Soap the night before surgery and the  morning of Surgery.  2.  If you choose to wash your hair, wash your hair first as usual with your  normal  shampoo.  3.  After you shampoo, rinse your hair and body thoroughly to remove the  shampoo.                           4.  Use CHG as you would any other liquid soap.  You can apply chg directly  to the skin and wash                       Gently with a scrungie or clean washcloth.  5.  Apply the CHG Soap to your body ONLY FROM THE NECK DOWN.   Do not use on face/ open                           Wound or open sores. Avoid contact with eyes, ears  mouth and genitals (private parts).                       Wash face,  Genitals (private parts) with your normal soap.             6.  Wash thoroughly, paying special attention to the area where your surgery  will be performed.  7.  Thoroughly rinse your body with warm water from the neck down.  8.  DO NOT shower/wash with your normal soap after using and rinsing off  the CHG Soap.                9.  Pat yourself dry with a clean towel.            10.  Wear clean pajamas.            11.  Place clean sheets on your bed the night of your first shower and do not  sleep with pets. Day of Surgery : Do not apply any lotions/deodorants the morning of surgery.  Please wear clean clothes to the hospital/surgery center.  FAILURE TO FOLLOW THESE INSTRUCTIONS MAY RESULT IN THE CANCELLATION OF YOUR SURGERY PATIENT SIGNATURE_________________________________  NURSE SIGNATURE__________________________________  ________________________________________________________________________   Angela Lin  An incentive spirometer is a tool that can help keep your lungs clear and active. This tool measures how well you are filling your lungs with each breath. Taking long deep breaths may help reverse or decrease the chance of developing breathing (pulmonary) problems (especially infection) following:  A long period of time when you are unable to move or be active. BEFORE THE PROCEDURE   If the spirometer includes an indicator to show your best effort, your nurse or respiratory therapist will set it to a desired goal.  If possible, sit up straight or lean slightly forward. Try not to slouch.  Hold the incentive spirometer in an upright position. INSTRUCTIONS FOR USE  1. Sit on the edge of your bed if possible, or sit up as far as you can in bed or on a chair. 2. Hold the incentive spirometer in an upright position. 3. Breathe out normally. 4. Place the mouthpiece in your mouth and seal your lips  tightly around it. 5. Breathe in slowly and as deeply as possible, raising the piston or the ball toward the top of the column. 6. Hold your breath for 3-5 seconds or for as long as possible. Allow the piston or ball to fall to the bottom of the column. 7. Remove the mouthpiece from your mouth and breathe out normally. 8. Rest for a few seconds and repeat Steps 1 through 7 at least 10 times every 1-2 hours when you are awake. Take your time and take a few normal breaths between deep breaths. 9. The spirometer may include an indicator to show your best effort. Use the indicator as a goal to work toward during each repetition. 10. After  each set of 10 deep breaths, practice coughing to be sure your lungs are clear. If you have an incision (the cut made at the time of surgery), support your incision when coughing by placing a pillow or rolled up towels firmly against it. Once you are able to get out of bed, walk around indoors and cough well. You may stop using the incentive spirometer when instructed by your caregiver.  RISKS AND COMPLICATIONS  Take your time so you do not get dizzy or light-headed.  If you are in pain, you may need to take or ask for pain medication before doing incentive spirometry. It is harder to take a deep breath if you are having pain. AFTER USE  Rest and breathe slowly and easily.  It can be helpful to keep track of a log of your progress. Your caregiver can provide you with a simple table to help with this. If you are using the spirometer at home, follow these instructions: [place] IF:   You are having difficultly using the spirometer.  You have trouble using the spirometer as often as instructed.  Your pain medication is not giving enough relief while using the spirometer.  You develop fever of 100.5 F (38.1 C) or higher. SEEK IMMEDIATE MEDICAL CARE IF:   You cough up bloody sputum that had not been present before.  You develop fever of 102 F  (38.9 C) or greater.  You develop worsening pain at or near the incision site. MAKE SURE YOU:   Understand these instructions.  Will watch your condition.  Will get help right away if you are not doing well or get worse. Document Released: 04/25/2007 Document Revised: 03/06/2012 Document Reviewed: 06/26/2007 [facility] Patient Information 2014 [place], [place].   ________________________________________________________________________

## 2018-12-04 ENCOUNTER — Encounter (HOSPITAL_COMMUNITY)
Admission: RE | Admit: 2018-12-04 | Discharge: 2018-12-04 | Disposition: A | Discharge status: Home / Self Care | Payer: BLUE CROSS/BLUE SHIELD | Source: Ambulatory Visit | Attending: Orthopaedic Surgery | Admitting: Orthopaedic Surgery

## 2018-12-04 ENCOUNTER — Encounter (HOSPITAL_COMMUNITY): Payer: Self-pay

## 2018-12-04 ENCOUNTER — Other Ambulatory Visit: Payer: Self-pay

## 2018-12-04 DIAGNOSIS — Z01812 Encounter for preprocedural laboratory examination: Secondary | ICD-10-CM | POA: Insufficient documentation

## 2018-12-04 LAB — CBC
HCT: 44.4 % (ref 36.0–46.0)
HEMOGLOBIN: 14.9 g/dL (ref 12.0–15.0)
MCH: 31.2 pg (ref 26.0–34.0)
MCHC: 33.6 g/dL (ref 30.0–36.0)
MCV: 92.9 fL (ref 80.0–100.0)
Platelets: 254 10*3/uL (ref 150–400)
RBC: 4.78 MIL/uL (ref 3.87–5.11)
RDW: 12.7 % (ref 11.5–15.5)
WBC: 8.4 10*3/uL (ref 4.0–10.5)
nRBC: 0 % (ref 0.0–0.2)

## 2018-12-04 LAB — SURGICAL PCR SCREEN
MRSA, PCR: NEGATIVE
Staphylococcus aureus: NEGATIVE

## 2018-12-08 ENCOUNTER — Ambulatory Visit (HOSPITAL_COMMUNITY): Payer: BLUE CROSS/BLUE SHIELD | Admitting: Certified Registered Nurse Anesthetist

## 2018-12-08 ENCOUNTER — Inpatient Hospital Stay (HOSPITAL_COMMUNITY)
Admission: RE | Admit: 2018-12-08 | Discharge: 2018-12-09 | DRG: 470 | Disposition: A | Discharge status: Home Health | Payer: BLUE CROSS/BLUE SHIELD | Attending: Orthopaedic Surgery | Admitting: Orthopaedic Surgery

## 2018-12-08 ENCOUNTER — Encounter (HOSPITAL_COMMUNITY)
Admission: RE | Disposition: A | Discharge status: Home Health | Payer: Self-pay | Source: Home / Self Care | Attending: Orthopaedic Surgery

## 2018-12-08 ENCOUNTER — Encounter (HOSPITAL_COMMUNITY): Payer: Self-pay | Admitting: *Deleted

## 2018-12-08 ENCOUNTER — Inpatient Hospital Stay (HOSPITAL_COMMUNITY): Payer: BLUE CROSS/BLUE SHIELD

## 2018-12-08 ENCOUNTER — Ambulatory Visit (HOSPITAL_COMMUNITY): Payer: BLUE CROSS/BLUE SHIELD

## 2018-12-08 ENCOUNTER — Other Ambulatory Visit: Payer: Self-pay

## 2018-12-08 DIAGNOSIS — Z96641 Presence of right artificial hip joint: Secondary | ICD-10-CM | POA: Diagnosis not present

## 2018-12-08 DIAGNOSIS — Z801 Family history of malignant neoplasm of trachea, bronchus and lung: Secondary | ICD-10-CM

## 2018-12-08 DIAGNOSIS — Z96642 Presence of left artificial hip joint: Secondary | ICD-10-CM | POA: Diagnosis not present

## 2018-12-08 DIAGNOSIS — F17210 Nicotine dependence, cigarettes, uncomplicated: Secondary | ICD-10-CM | POA: Diagnosis present

## 2018-12-08 DIAGNOSIS — M25552 Pain in left hip: Secondary | ICD-10-CM | POA: Diagnosis not present

## 2018-12-08 DIAGNOSIS — Z471 Aftercare following joint replacement surgery: Secondary | ICD-10-CM | POA: Diagnosis not present

## 2018-12-08 DIAGNOSIS — M1612 Unilateral primary osteoarthritis, left hip: Secondary | ICD-10-CM

## 2018-12-08 HISTORY — PX: TOTAL HIP ARTHROPLASTY: SHX124

## 2018-12-08 SURGERY — ARTHROPLASTY, HIP, TOTAL, ANTERIOR APPROACH
Anesthesia: Monitor Anesthesia Care | Laterality: Left

## 2018-12-08 MED ORDER — MIDAZOLAM HCL 2 MG/2ML IJ SOLN
INTRAMUSCULAR | Status: AC
  Filled 2018-12-08: qty 2

## 2018-12-08 MED ORDER — PROPOFOL 10 MG/ML IV BOLUS
INTRAVENOUS | Status: AC
  Filled 2018-12-08: qty 80

## 2018-12-08 MED ORDER — METOCLOPRAMIDE HCL 5 MG/ML IJ SOLN: 5.0000 mg | Freq: Three times a day (TID) | INTRAMUSCULAR | Status: DC | PRN

## 2018-12-08 MED ORDER — METHOCARBAMOL 500 MG PO TABS
500.0000 mg | ORAL_TABLET | Freq: Four times a day (QID) | ORAL | Status: DC | PRN
  Administered 2018-12-09 (×2): 500 mg via ORAL
  Filled 2018-12-08 (×2): qty 1

## 2018-12-08 MED ORDER — OXYCODONE HCL 5 MG PO TABS: 10.0000 mg | ORAL_TABLET | ORAL | Status: DC | PRN

## 2018-12-08 MED ORDER — DEXAMETHASONE SODIUM PHOSPHATE 10 MG/ML IJ SOLN
INTRAMUSCULAR | Status: AC
  Filled 2018-12-08: qty 1

## 2018-12-08 MED ORDER — DOCUSATE SODIUM 100 MG PO CAPS
100.0000 mg | ORAL_CAPSULE | Freq: Two times a day (BID) | ORAL | Status: DC
  Administered 2018-12-08 – 2018-12-09 (×2): 100 mg via ORAL
  Filled 2018-12-08 (×2): qty 1

## 2018-12-08 MED ORDER — SODIUM CHLORIDE 0.9 % IV SOLN
INTRAVENOUS | Status: DC | PRN
  Administered 2018-12-08: 25 ug/min via INTRAVENOUS

## 2018-12-08 MED ORDER — PHENYLEPHRINE HCL 10 MG/ML IJ SOLN
INTRAMUSCULAR | Status: AC
  Filled 2018-12-08: qty 1

## 2018-12-08 MED ORDER — MIDAZOLAM HCL 2 MG/2ML IJ SOLN
INTRAMUSCULAR | Status: DC | PRN
  Administered 2018-12-08: 2 mg via INTRAVENOUS

## 2018-12-08 MED ORDER — FENTANYL CITRATE (PF) 100 MCG/2ML IJ SOLN
INTRAMUSCULAR | Status: AC
  Filled 2018-12-08: qty 2

## 2018-12-08 MED ORDER — NAPHAZOLINE-GLYCERIN 0.012-0.2 % OP SOLN
1.0000 [drp] | Freq: Every day | OPHTHALMIC | Status: DC
  Administered 2018-12-09: 1 [drp] via OPHTHALMIC
  Filled 2018-12-08: qty 15

## 2018-12-08 MED ORDER — ONDANSETRON HCL 4 MG/2ML IJ SOLN
INTRAMUSCULAR | Status: DC | PRN
  Administered 2018-12-08: 4 mg via INTRAVENOUS

## 2018-12-08 MED ORDER — HYDROMORPHONE HCL 1 MG/ML IJ SOLN: 0.5000 mg | INTRAMUSCULAR | Status: DC | PRN

## 2018-12-08 MED ORDER — ONDANSETRON HCL 4 MG/2ML IJ SOLN: 4.0000 mg | Freq: Four times a day (QID) | INTRAMUSCULAR | Status: DC | PRN

## 2018-12-08 MED ORDER — FENTANYL CITRATE (PF) 100 MCG/2ML IJ SOLN: 25.0000 ug | INTRAMUSCULAR | Status: DC | PRN

## 2018-12-08 MED ORDER — PANTOPRAZOLE SODIUM 40 MG PO TBEC
40.0000 mg | DELAYED_RELEASE_TABLET | Freq: Every day | ORAL | Status: DC
  Administered 2018-12-08 – 2018-12-09 (×2): 40 mg via ORAL
  Filled 2018-12-08 (×2): qty 1

## 2018-12-08 MED ORDER — DIPHENHYDRAMINE HCL 12.5 MG/5ML PO ELIX: 12.5000 mg | ORAL_SOLUTION | ORAL | Status: DC | PRN

## 2018-12-08 MED ORDER — METOCLOPRAMIDE HCL 5 MG PO TABS: 5.0000 mg | ORAL_TABLET | Freq: Three times a day (TID) | ORAL | Status: DC | PRN

## 2018-12-08 MED ORDER — PROPOFOL 500 MG/50ML IV EMUL
INTRAVENOUS | Status: DC | PRN
  Administered 2018-12-08: 75 ug/kg/min via INTRAVENOUS

## 2018-12-08 MED ORDER — ACETAMINOPHEN 500 MG PO TABS: 1000.0000 mg | ORAL_TABLET | Freq: Once | ORAL | Status: DC | PRN

## 2018-12-08 MED ORDER — LACTATED RINGERS IV SOLN
INTRAVENOUS | Status: DC
  Administered 2018-12-08: 1000 mL via INTRAVENOUS
  Administered 2018-12-08: 15:00:00 via INTRAVENOUS

## 2018-12-08 MED ORDER — ASPIRIN 81 MG PO CHEW
81.0000 mg | CHEWABLE_TABLET | Freq: Two times a day (BID) | ORAL | Status: DC
  Administered 2018-12-08 – 2018-12-09 (×2): 81 mg via ORAL
  Filled 2018-12-08 (×2): qty 1

## 2018-12-08 MED ORDER — METHOCARBAMOL 500 MG IVPB - SIMPLE MED
500.0000 mg | Freq: Four times a day (QID) | INTRAVENOUS | Status: DC | PRN
  Administered 2018-12-08: 500 mg via INTRAVENOUS
  Filled 2018-12-08: qty 50

## 2018-12-08 MED ORDER — ALUM & MAG HYDROXIDE-SIMETH 200-200-20 MG/5ML PO SUSP: 30.0000 mL | ORAL | Status: DC | PRN

## 2018-12-08 MED ORDER — ONDANSETRON HCL 4 MG/2ML IJ SOLN
INTRAMUSCULAR | Status: AC
  Filled 2018-12-08: qty 2

## 2018-12-08 MED ORDER — ZOLPIDEM TARTRATE 5 MG PO TABS: 5.0000 mg | ORAL_TABLET | Freq: Every evening | ORAL | Status: DC | PRN

## 2018-12-08 MED ORDER — SODIUM CHLORIDE 0.9 % IV SOLN
INTRAVENOUS | Status: DC
  Administered 2018-12-08: 20:00:00 via INTRAVENOUS

## 2018-12-08 MED ORDER — TRANEXAMIC ACID-NACL 1000-0.7 MG/100ML-% IV SOLN
1000.0000 mg | INTRAVENOUS | Status: AC
  Administered 2018-12-08: 1000 mg via INTRAVENOUS

## 2018-12-08 MED ORDER — PROPOFOL 10 MG/ML IV BOLUS
INTRAVENOUS | Status: DC | PRN
  Administered 2018-12-08: 20 mg via INTRAVENOUS

## 2018-12-08 MED ORDER — OXYCODONE HCL 5 MG PO TABS
5.0000 mg | ORAL_TABLET | ORAL | Status: DC | PRN
  Administered 2018-12-09 (×3): 5 mg via ORAL
  Filled 2018-12-08 (×3): qty 1

## 2018-12-08 MED ORDER — BUPIVACAINE IN DEXTROSE 0.75-8.25 % IT SOLN
INTRATHECAL | Status: DC | PRN
  Administered 2018-12-08: 1.6 mL via INTRATHECAL

## 2018-12-08 MED ORDER — ACETAMINOPHEN 10 MG/ML IV SOLN: 1000.0000 mg | Freq: Once | INTRAVENOUS | Status: DC | PRN

## 2018-12-08 MED ORDER — CHLORHEXIDINE GLUCONATE 4 % EX LIQD: 60.0000 mL | Freq: Once | CUTANEOUS | Status: DC

## 2018-12-08 MED ORDER — TRANEXAMIC ACID-NACL 1000-0.7 MG/100ML-% IV SOLN
INTRAVENOUS | Status: AC
  Filled 2018-12-08: qty 100

## 2018-12-08 MED ORDER — FENTANYL CITRATE (PF) 100 MCG/2ML IJ SOLN
INTRAMUSCULAR | Status: DC | PRN
  Administered 2018-12-08: 100 ug via INTRATHECAL

## 2018-12-08 MED ORDER — CEFAZOLIN SODIUM-DEXTROSE 2-4 GM/100ML-% IV SOLN
2.0000 g | INTRAVENOUS | Status: AC
  Administered 2018-12-08: 2 g via INTRAVENOUS

## 2018-12-08 MED ORDER — METHOCARBAMOL 500 MG IVPB - SIMPLE MED
INTRAVENOUS | Status: AC
  Filled 2018-12-08: qty 50

## 2018-12-08 MED ORDER — MENTHOL 3 MG MT LOZG: 1.0000 | LOZENGE | OROMUCOSAL | Status: DC | PRN

## 2018-12-08 MED ORDER — LIDOCAINE 2% (20 MG/ML) 5 ML SYRINGE
INTRAMUSCULAR | Status: AC
  Filled 2018-12-08: qty 5

## 2018-12-08 MED ORDER — CEFAZOLIN SODIUM-DEXTROSE 1-4 GM/50ML-% IV SOLN
1.0000 g | Freq: Four times a day (QID) | INTRAVENOUS | Status: AC
  Administered 2018-12-08 – 2018-12-09 (×2): 1 g via INTRAVENOUS
  Filled 2018-12-08 (×2): qty 50

## 2018-12-08 MED ORDER — ACETAMINOPHEN 325 MG PO TABS
325.0000 mg | ORAL_TABLET | Freq: Four times a day (QID) | ORAL | Status: DC | PRN
  Filled 2018-12-08: qty 2

## 2018-12-08 MED ORDER — CEFAZOLIN SODIUM-DEXTROSE 2-4 GM/100ML-% IV SOLN
INTRAVENOUS | Status: AC
  Filled 2018-12-08: qty 100

## 2018-12-08 MED ORDER — VITAMIN D 25 MCG (1000 UNIT) PO TABS
2000.0000 [IU] | ORAL_TABLET | Freq: Every day | ORAL | Status: DC
  Administered 2018-12-08 – 2018-12-09 (×2): 2000 [IU] via ORAL
  Filled 2018-12-08 (×2): qty 2

## 2018-12-08 MED ORDER — OXYCODONE HCL 5 MG/5ML PO SOLN: 5.0000 mg | Freq: Once | ORAL | Status: DC | PRN

## 2018-12-08 MED ORDER — POLYETHYLENE GLYCOL 3350 17 G PO PACK: 17.0000 g | PACK | Freq: Every day | ORAL | Status: DC | PRN

## 2018-12-08 MED ORDER — LIDOCAINE HCL (CARDIAC) PF 100 MG/5ML IV SOSY
PREFILLED_SYRINGE | INTRAVENOUS | Status: DC | PRN
  Administered 2018-12-08: 40 mg via INTRATRACHEAL

## 2018-12-08 MED ORDER — ONDANSETRON HCL 4 MG PO TABS: 4.0000 mg | ORAL_TABLET | Freq: Four times a day (QID) | ORAL | Status: DC | PRN

## 2018-12-08 MED ORDER — OXYCODONE HCL 5 MG PO TABS: 5.0000 mg | ORAL_TABLET | Freq: Once | ORAL | Status: DC | PRN

## 2018-12-08 MED ORDER — ACETAMINOPHEN 160 MG/5ML PO SOLN: 1000.0000 mg | Freq: Once | ORAL | Status: DC | PRN

## 2018-12-08 MED ORDER — LACTATED RINGERS IV SOLN: INTRAVENOUS | Status: DC

## 2018-12-08 MED ORDER — DEXAMETHASONE SODIUM PHOSPHATE 10 MG/ML IJ SOLN
INTRAMUSCULAR | Status: DC | PRN
  Administered 2018-12-08: 8 mg via INTRAVENOUS

## 2018-12-08 MED ORDER — PHENOL 1.4 % MT LIQD: 1.0000 | OROMUCOSAL | Status: DC | PRN

## 2018-12-08 SURGICAL SUPPLY — 38 items
APL SKNCLS STERI-STRIP NONHPOA (GAUZE/BANDAGES/DRESSINGS)
BAG SPEC THK2 15X12 ZIP CLS (MISCELLANEOUS)
BAG ZIPLOCK 12X15 (MISCELLANEOUS) IMPLANT
BENZOIN TINCTURE PRP APPL 2/3 (GAUZE/BANDAGES/DRESSINGS) IMPLANT
BLADE SAW SGTL 18X1.27X75 (BLADE) ×2 IMPLANT
BLADE SURG SZ10 CARB STEEL (BLADE) ×4 IMPLANT
COVER PERINEAL POST (MISCELLANEOUS) ×2 IMPLANT
COVER SURGICAL LIGHT HANDLE (MISCELLANEOUS) ×2 IMPLANT
COVER WAND RF STERILE (DRAPES) ×1 IMPLANT
DRAPE STERI IOBAN 125X83 (DRAPES) ×2 IMPLANT
DRAPE U-SHAPE 47X51 STRL (DRAPES) ×4 IMPLANT
DRSG AQUACEL AG ADV 3.5X10 (GAUZE/BANDAGES/DRESSINGS) ×2 IMPLANT
DURAPREP 26ML APPLICATOR (WOUND CARE) ×2 IMPLANT
ELECT REM PT RETURN 15FT ADLT (MISCELLANEOUS) ×2 IMPLANT
GAUZE XEROFORM 1X8 LF (GAUZE/BANDAGES/DRESSINGS) IMPLANT
GLOVE BIO SURGEON STRL SZ7.5 (GLOVE) ×7 IMPLANT
GLOVE BIOGEL PI IND STRL 8 (GLOVE) ×2 IMPLANT
GLOVE BIOGEL PI INDICATOR 8 (GLOVE) ×2
GLOVE ECLIPSE 8.0 STRL XLNG CF (GLOVE) ×2 IMPLANT
GOWN STRL REUS W/TWL XL LVL3 (GOWN DISPOSABLE) ×6 IMPLANT
HANDPIECE INTERPULSE COAX TIP (DISPOSABLE) ×2
HEAD CERAMIC 36 PLUS5 (Hips) ×1 IMPLANT
HOLDER FOLEY CATH W/STRAP (MISCELLANEOUS) ×2 IMPLANT
LINER ACETAB NEUTRAL 36ID 520D (Liner) ×1 IMPLANT
PACK ANTERIOR HIP CUSTOM (KITS) ×2 IMPLANT
PIN SECTOR W/GRIP ACE CUP 52MM (Hips) ×1 IMPLANT
SET HNDPC FAN SPRY TIP SCT (DISPOSABLE) ×1 IMPLANT
STAPLER VISISTAT 35W (STAPLE) IMPLANT
STEM CORAIL KA11 (Stem) ×1 IMPLANT
STRIP CLOSURE SKIN 1/2X4 (GAUZE/BANDAGES/DRESSINGS) ×1 IMPLANT
SUT ETHIBOND NAB CT1 #1 30IN (SUTURE) ×2 IMPLANT
SUT MNCRL AB 4-0 PS2 18 (SUTURE) ×1 IMPLANT
SUT VIC AB 0 CT1 36 (SUTURE) ×2 IMPLANT
SUT VIC AB 1 CT1 36 (SUTURE) ×2 IMPLANT
SUT VIC AB 2-0 CT1 27 (SUTURE) ×4
SUT VIC AB 2-0 CT1 TAPERPNT 27 (SUTURE) ×2 IMPLANT
TRAY FOLEY MTR SLVR 16FR STAT (SET/KITS/TRAYS/PACK) ×2 IMPLANT
YANKAUER SUCT BULB TIP 10FT TU (MISCELLANEOUS) ×2 IMPLANT

## 2018-12-08 NOTE — Transfer of Care (Signed)
Immediate Anesthesia Transfer of Care Note  Patient: Angela Lin  Procedure(s) Performed: LEFT TOTAL HIP ARTHROPLASTY ANTERIOR APPROACH (Left )  Patient Location: PACU  Anesthesia Type:MAC and Spinal  Level of Consciousness: awake, alert  and oriented  Airway & Oxygen Therapy: Patient Spontanous Breathing and Patient connected to face mask oxygen  Post-op Assessment: Report given to RN and Post -op Vital signs reviewed and stable  Post vital signs: Reviewed and stable  Last Vitals:  Vitals Value Taken Time  BP 137/77 12/08/2018  4:21 PM  Temp    Pulse 91 12/08/2018  4:22 PM  Resp 20 12/08/2018  4:22 PM  SpO2 100 % 12/08/2018  4:22 PM  Vitals shown include unvalidated device data.  Last Pain:  Vitals:   12/08/18 1244  TempSrc:   PainSc: 0-No pain      Patients Stated Pain Goal: 4 (52/48/18 5909)  Complications: No apparent anesthesia complications

## 2018-12-08 NOTE — Brief Op Note (Signed)
12/08/2018  3:58 PM  PATIENT:  Angela Lin  57 y.o. female  PRE-OPERATIVE DIAGNOSIS:  End Stage Osteoarthritis Left Hip  POST-OPERATIVE DIAGNOSIS:  End Stage Osteoarthritis Left Hip  PROCEDURE:  Procedure(s): LEFT TOTAL HIP ARTHROPLASTY ANTERIOR APPROACH (Left)  SURGEON:  Surgeon(s) and Role:    Mcarthur Rossetti, MD - Primary  PHYSICIAN ASSISTANT:  Benita Stabile, PA-C  ANESTHESIA:   spinal  EBL:  250 mL   COUNTS:  YES  TOURNIQUET:  * No tourniquets in log *  DICTATION: .Other Dictation: Dictation Number [id number]  PLAN OF CARE: Admit to inpatient   PATIENT DISPOSITION:  PACU - hemodynamically stable.   Delay start of Pharmacological VTE agent (>24hrs) due to surgical blood loss or risk of bleeding: no

## 2018-12-08 NOTE — Anesthesia Procedure Notes (Signed)
Spinal  Patient location during procedure: OR Start time: 12/08/2018 2:35 PM End time: 12/08/2018 2:42 PM Staffing Anesthesiologist: Oleta Mouse, MD Performed: anesthesiologist  Preanesthetic Checklist Completed: patient identified, surgical consent, pre-op evaluation, timeout performed, IV checked, risks and benefits discussed and monitors and equipment checked Spinal Block Patient position: sitting Prep: DuraPrep Patient monitoring: heart rate, cardiac monitor, continuous pulse ox and blood pressure Approach: midline Location: L3-4 Injection technique: single-shot Needle Needle type: Pencan  Needle gauge: 24 G Needle length: 9 cm Assessment Sensory level: T6

## 2018-12-08 NOTE — Anesthesia Postprocedure Evaluation (Signed)
Anesthesia Post Note  Patient: Angela Lin  Procedure(s) Performed: LEFT TOTAL HIP ARTHROPLASTY ANTERIOR APPROACH (Left )     Patient location during evaluation: PACU Anesthesia Type: MAC and Spinal Level of consciousness: awake and alert Pain management: pain level controlled Vital Signs Assessment: post-procedure vital signs reviewed and stable Respiratory status: spontaneous breathing, nonlabored ventilation, respiratory function stable and patient connected to nasal cannula oxygen Cardiovascular status: stable and blood pressure returned to baseline Postop Assessment: no apparent nausea or vomiting and spinal receding Anesthetic complications: no    Last Vitals:  Vitals:   12/08/18 1811 12/08/18 1852  BP: (!) 196/108 (!) 172/102  Pulse: 81 85  Resp: 16 16  Temp: 36.7 C 36.8 C  SpO2: 100% 98%    Last Pain:  Vitals:   12/08/18 1911  TempSrc:   PainSc: 0-No pain                 Jawanna Dykman

## 2018-12-08 NOTE — Anesthesia Preprocedure Evaluation (Addendum)
Anesthesia Evaluation  Patient identified by MRN, date of birth, ID band Patient awake    History of Anesthesia Complications Negative for: history of anesthetic complications  Airway Mallampati: II  TM Distance: >3 FB Neck ROM: Full    Dental  (+) Teeth Intact   Pulmonary Current Smoker,    breath sounds clear to auscultation       Cardiovascular negative cardio ROS   Rhythm:Regular     Neuro/Psych negative neurological ROS  negative psych ROS   GI/Hepatic negative GI ROS, Neg liver ROS,   Endo/Other  negative endocrine ROS  Renal/GU negative Renal ROS     Musculoskeletal  (+) Arthritis ,   Abdominal   Peds  Hematology negative hematology ROS (+)   Anesthesia Other Findings   Reproductive/Obstetrics                            Anesthesia Physical Anesthesia Plan  ASA: I  Anesthesia Plan: MAC and Spinal   Post-op Pain Management:    Induction:   PONV Risk Score and Plan: 1 and Treatment may vary due to age or medical condition and Propofol infusion  Airway Management Planned: Nasal Cannula  Additional Equipment: None  Intra-op Plan:   Post-operative Plan:   Informed Consent: I have reviewed the patients History and Physical, chart, labs and discussed the procedure including the risks, benefits and alternatives for the proposed anesthesia with the patient or authorized representative who has indicated his/her understanding and acceptance.   Dental advisory given  Plan Discussed with: CRNA and Surgeon  Anesthesia Plan Comments:         Anesthesia Quick Evaluation

## 2018-12-08 NOTE — H&P (Signed)
TOTAL HIP ADMISSION H&P  Patient is admitted for left total hip arthroplasty.  Subjective:  Chief Complaint: left hip pain  HPI: Angela Lin, 57 y.o. female, has a history of pain and functional disability in the left hip(s) due to arthritis and patient has failed non-surgical conservative treatments for greater than 12 weeks to include NSAID's and/or analgesics, corticosteriod injections, flexibility and strengthening excercises, use of assistive devices, weight reduction as appropriate and activity modification.  Onset of symptoms was gradual starting 3 years ago with gradually worsening course since that time.The patient noted no past surgery on the left hip(s).  Patient currently rates pain in the left hip at 10 out of 10 with activity. Patient has night pain, worsening of pain with activity and weight bearing, trendelenberg gait, pain that interfers with activities of daily living and pain with passive range of motion. Patient has evidence of subchondral cysts, subchondral sclerosis, periarticular osteophytes and joint space narrowing by imaging studies. This condition presents safety issues increasing the risk of falls.  There is no current active infection.  Patient Active Problem List   Diagnosis Date Noted  . Unilateral primary osteoarthritis, left hip 12/08/2018  . Osteoarthritis of right hip 06/04/2016  . Avascular necrosis of bones of both hips ([place]) 06/04/2016  . Status post total replacement of right hip 06/04/2016   Past Medical History:  Diagnosis Date  . Arthritis    osteoarthritis- hip  . Endometriosis   . Heart murmur    as a child, reports as benign   . Sinus congestion    allergies     Past Surgical History:  Procedure Laterality Date  . DIAGNOSTIC LAPAROSCOPY     endometriosis- 20 yrs ago  . TOTAL HIP ARTHROPLASTY Right 06/04/2016   Procedure: RIGHT TOTAL HIP ARTHROPLASTY ANTERIOR APPROACH;  Surgeon: Mcarthur Rossetti, MD;  Location: [facility];   Service: Orthopedics;  Laterality: Right;    Current Facility-Administered Medications  Medication Dose Route Frequency Provider Last Rate Last Dose  . ceFAZolin (ANCEF) 2-4 GM/100ML-% IVPB           . ceFAZolin (ANCEF) IVPB 2g/100 mL premix  2 g Intravenous On Call to OR Pete Pelt, PA-C      . chlorhexidine (HIBICLENS) 4 % liquid 4 application  60 mL Topical Once Pete Pelt, PA-C      . lactated ringers infusion   Intravenous Continuous Oleta Mouse, MD      . lactated ringers infusion   Intravenous Continuous Brennan Bailey, MD 75 mL/hr at 12/08/18 1238 1,000 mL at 12/08/18 1238  . tranexamic acid (CYKLOKAPRON) 1000MG /132mL IVPB           . tranexamic acid (CYKLOKAPRON) IVPB 1,000 mg  1,000 mg Intravenous To OR Pete Pelt, PA-C       No Known Allergies  Social History   Tobacco Use  . Smoking status: Current Every Day Smoker    Packs/day: 1.00    Years: 20.00    Pack years: 20.00    Types: Cigarettes  . Smokeless tobacco: Never Used  . Tobacco comment: past was able to quit- now smoking 0.5 ppd  Substance Use Topics  . Alcohol use: Yes    Comment: 2-3 times weekly    Family History  Problem Relation Age of Onset  . Cancer Father        Lung cancer- passed 2005      Review of Systems  Musculoskeletal: Positive for joint pain.  All  other systems reviewed and are negative.   Objective:  Physical Exam  Constitutional: She is oriented to person, place, and time. She appears well-developed and well-nourished.  HENT:  Head: Normocephalic and atraumatic.  Eyes: Pupils are equal, round, and reactive to light. EOM are normal.  Neck: Normal range of motion. Neck supple.  Cardiovascular: Normal rate and regular rhythm.  Respiratory: Effort normal and breath sounds normal.  GI: Soft. Bowel sounds are normal.  Musculoskeletal:     Left hip: She exhibits decreased range of motion, decreased strength, tenderness and bony tenderness.  Neurological: She  is alert and oriented to person, place, and time.  Skin: Skin is warm and dry.  Psychiatric: She has a normal mood and affect.    Vital signs in last 24 hours: Temp:  [98 F (36.7 C)] 98 F (36.7 C) (12/13 1215) Pulse Rate:  [80] 80 (12/13 1215) Resp:  [18] 18 (12/13 1215) BP: (154)/(86) 154/86 (12/13 1215) SpO2:  [100 %] 100 % (12/13 1215) Weight:  [75.8 kg] 75.8 kg (12/13 1244)  Labs:   Estimated body mass index is 27.79 kg/m as calculated from the following:   Height as of this encounter: 5\' 5"  (1.651 m).   Weight as of this encounter: 75.8 kg.   Imaging Review Plain radiographs demonstrate severe degenerative joint disease of the left hip(s). The bone quality appears to be good for age and reported activity level.    Preoperative templating of the joint replacement has been completed, documented, and submitted to the Operating Room personnel in order to optimize intra-operative equipment management.     Assessment/Plan:  End stage arthritis, left hip(s)  The patient history, physical examination, clinical judgement of the provider and imaging studies are consistent with end stage degenerative joint disease of the left hip(s) and total hip arthroplasty is deemed medically necessary. The treatment options including medical management, injection therapy, arthroscopy and arthroplasty were discussed at length. The risks and benefits of total hip arthroplasty were presented and reviewed. The risks due to aseptic loosening, infection, stiffness, dislocation/subluxation,  thromboembolic complications and other imponderables were discussed.  The patient acknowledged the explanation, agreed to proceed with the plan and consent was signed. Patient is being admitted for inpatient treatment for surgery, pain control, PT, OT, prophylactic antibiotics, VTE prophylaxis, progressive ambulation and ADL's and discharge planning.The patient is planning to be discharged home with home health  services

## 2018-12-08 NOTE — Op Note (Signed)
[name], Angela Lin MEDICAL RECORD [id number] ACCOUNT [id number] DATE OF BIRTH:1961/05/12 FACILITY: [facility] LOCATION: [facility] PHYSICIAN:Thoams Siefert [place], MD  OPERATIVE REPORT  DATE OF PROCEDURE:  12/08/2018  PREOPERATIVE DIAGNOSIS:  Primary osteoarthritis and degenerative joint disease, left hip.  POSTOPERATIVE DIAGNOSIS:  Primary osteoarthritis and degenerative joint disease, left hip.  PROCEDURE:  Left total hip arthroplasty through direct anterior approach.  IMPLANTS:  DePuy Sector Gription acetabular component size 52, size 36+0 polyethylene liner, size 11 Corail femoral component with standard offset, size 36+5 ceramic hip ball.  SURGEON:  Lind Guest. Ninfa Linden, MD  ASSISTANT:  Benita Stabile, PA-C  ANESTHESIA:  Spinal.  ANTIBIOTICS:  Two grams IV Ancef.  ESTIMATED BLOOD LOSS:  350 mL.  COMPLICATIONS:  None.  INDICATIONS:  The patient is a 57 year old female well known to me.  She has debilitating arthritis of both her hips, and in 2017 underwent a successful right total hip arthroplasty.  Her pain is worse in her left hip.  She has had at least 2  intraarticular steroid injections in that left hip over the course of time.  Her x-rays show complete loss of her joint space.  At this point, given the daily pain she has and the detrimental effect this has had on her quality of life and her activities  of daily living and her mobility, she does wish to proceed with a total hip arthroplasty, now on the left side.  She understands fully the risks and benefits of the surgery having had it performed before.  DESCRIPTION OF PROCEDURE:  After informed consent was obtained and appropriate left hip was marked, she was brought to the operating room, sat up on a stretcher where spinal anesthesia was then obtained.  She was then laid in a supine position.  I  assessed her leg length, and a Foley catheter was placed as well.  I then placed traction boots on both her feet and  placed her supine on the Hana fracture table with a perineal post in place and both legs in in-line skeletal traction device and no  traction applied.  Her left operative hip was prepped and draped with DuraPrep and sterile drapes.  A time-out was called, and she was identified as correct patient, correct left hip.  I then made an incision just inferior and posterior to the anterior  superior iliac spine and carried this obliquely down the leg.  I dissected down tensor fascia lata muscle.  Tensor fascia was then divided longitudinally to proceed with direct anterior approach to the hip.  We identified and cauterized circumflex  vessels.  I then identified the hip capsule, opened up the hip capsule in an L-type format, finding a moderate joint effusion and significant periarticular arthritis around her left hip.  We then placed a Cobra retractor around the medial and lateral  femoral neck and made our femoral neck cut with an oscillating saw just proximal to the lesser trochanter and completed this with an osteotome.  We placed a corkscrew guide in the femoral head and removed the femoral head in its entirety and found a wide  area devoid of cartilage.  We then placed a bent Hohmann over the medial acetabular rim and removed remnants of the acetabular labrum and other debris.  We then began reaming sequentially from a size 44 reamer, going all the way up to a size 51 with all  reamers under direct visualization, the last reamer under direct fluoroscopy so we could obtain our depth in reaming, our inclination  and anteversion.  We then placed the real DePuy Sector Gription acetabular component size 52 and a 36+0 neutral  polyethylene liner.  Attention was then turned to the femur.  With the leg externally rotated to 120 degrees, extended and adducted, we were able to place a Mueller retractor medially and a Hohmann retractor behind the greater trochanter.  We released  the lateral joint capsule and used a  box-cutting osteotome to enter the femoral canal and a rongeur to lateralize.  I then began broaching from a size 8 broach using Corail broaching system going up to a size 11.  With a size 11 in place, we trialed a  standard offset femoral neck and a 36+1.5 hip ball, reduced this in the acetabulum.  We felt like we needed just a little bit more leg length and offset.  We dislocated the hip and removed the trial components.  We placed the real Corail femoral  component size 11 with standard offset, and we went with a 36+5 ceramic hip ball and reduced this in the acetabulum.  We felt like we reproduced her leg length offset, and she had good stability and range of motion.  This was assessed visually and under  direct fluoroscopy.  We then irrigated the tissues with normal saline solution using pulsatile lavage.  We were able to reapproximate the joint capsule with interrupted #1 Ethibond suture, followed by running #1 Vicryl in tensor fascia, 0 Vicryl in the  deep tissue, 2-0 Vicryl subcutaneous tissue, 4-0 Monocryl subcuticular stitch and Steri-Strips on the skin.  Aquacel dressing was applied.  She was taken off the Hana table and taken to recovery in stable condition.  All final counts were correct.  There  were no complications noted.  Note Benita Stabile, PA-C, assisted the entire case.  His assistance was crucial for facilitating all aspects of this case.  LN/NUANCE  D:12/08/2018 T:12/08/2018 JOB:[id number]

## 2018-12-09 LAB — BASIC METABOLIC PANEL
Anion gap: 8 (ref 5–15)
BUN: 9 mg/dL (ref 6–20)
CO2: 25 mmol/L (ref 22–32)
Calcium: 8.7 mg/dL — ABNORMAL LOW (ref 8.9–10.3)
Chloride: 100 mmol/L (ref 98–111)
Creatinine, Ser: 0.68 mg/dL (ref 0.44–1.00)
GFR calc Af Amer: >60 mL/min (ref >60)
GFR calc non Af Amer: >60 mL/min (ref >60)
GLUCOSE: 153 mg/dL — AB (ref 70–99)
Potassium: 3.8 mmol/L (ref 3.5–5.1)
Sodium: 133 mmol/L — ABNORMAL LOW (ref 135–145)

## 2018-12-09 LAB — CBC
HCT: 38.8 % (ref 36.0–46.0)
Hemoglobin: 13.1 g/dL (ref 12.0–15.0)
MCH: 31.2 pg (ref 26.0–34.0)
MCHC: 33.8 g/dL (ref 30.0–36.0)
MCV: 92.4 fL (ref 80.0–100.0)
Platelets: 218 10*3/uL (ref 150–400)
RBC: 4.2 MIL/uL (ref 3.87–5.11)
RDW: 12.3 % (ref 11.5–15.5)
WBC: 10.7 10*3/uL — ABNORMAL HIGH (ref 4.0–10.5)
nRBC: 0 % (ref 0.0–0.2)

## 2018-12-09 MED ORDER — OXYCODONE HCL 5 MG PO TABS: 5.0000 mg | ORAL_TABLET | ORAL | 0 refills | Status: DC | PRN

## 2018-12-09 MED ORDER — ASPIRIN 81 MG PO CHEW: 81.0000 mg | CHEWABLE_TABLET | Freq: Two times a day (BID) | ORAL | 0 refills | Status: DC

## 2018-12-09 MED ORDER — METHOCARBAMOL 500 MG PO TABS: 500.0000 mg | ORAL_TABLET | Freq: Four times a day (QID) | ORAL | 0 refills | Status: DC | PRN

## 2018-12-09 NOTE — Plan of Care (Signed)
  Problem: Health Behavior/Discharge Planning: Goal: Ability to manage health-related needs will improve Outcome: Progressing   Problem: Clinical Measurements: Goal: Ability to maintain clinical measurements within normal limits will improve Outcome: Progressing Goal: Will remain free from infection Outcome: Progressing Goal: Diagnostic test results will improve Outcome: Progressing Goal: Respiratory complications will improve Outcome: Progressing Goal: Cardiovascular complication will be avoided Outcome: Progressing   Problem: Activity: Goal: Risk for activity intolerance will decrease Outcome: Progressing   Problem: Nutrition: Goal: Adequate nutrition will be maintained Outcome: Progressing   Problem: Elimination: Goal: Will not experience complications related to bowel motility Outcome: Progressing Goal: Will not experience complications related to urinary retention Outcome: Progressing   Problem: Pain Managment: Goal: General experience of comfort will improve Outcome: Progressing   Problem: Safety: Goal: Ability to remain free from injury will improve Outcome: Progressing   Problem: Skin Integrity: Goal: Risk for impaired skin integrity will decrease Outcome: Progressing   Problem: Activity: Goal: Ability to avoid complications of mobility impairment will improve Outcome: Progressing Goal: Ability to tolerate increased activity will improve Outcome: Progressing   Problem: Clinical Measurements: Goal: Postoperative complications will be avoided or minimized Outcome: Progressing   Problem: Pain Management: Goal: Pain level will decrease with appropriate interventions Outcome: Progressing   Problem: Skin Integrity: Goal: Will show signs of wound healing Outcome: Progressing

## 2018-12-09 NOTE — Evaluation (Signed)
Physical Therapy Evaluation Patient Details Name: Angela Lin MRN: [id number] DOB: Mar 31, 1961 Today's Date: 12/09/2018   History of Present Illness  Patient is a 58 y/o female now s/p L direct anterior THA.  PMH positive for R THA, AVN of both hips and tobacco use.  Clinical Impression  Patient presents with decreased independence with mobility due to pain, limited AROM L LE and decreased knowledge of use of DME.  She will benefit from skilled PT in the acute setting to allow return home with initial assist from family and follow up PT as noted below.     Follow Up Recommendations Follow surgeon's recommendation for DC plan and follow-up therapies    Equipment Recommendations  None recommended by PT    Recommendations for Other Services       Precautions / Restrictions Precautions Precautions: Fall Restrictions Weight Bearing Restrictions: No Other Position/Activity Restrictions: WBAT      Mobility  Bed Mobility Overal bed mobility: Needs Assistance Bed Mobility: Supine to Sit     Supine to sit: Supervision;HOB elevated     General bed mobility comments: for safety   Transfers Overall transfer level: Needs assistance Equipment used: Rolling walker (2 wheeled) Transfers: Sit to/from Stand Sit to Stand: Min guard         General transfer comment: for balance  Ambulation/Gait Ambulation/Gait assistance: Min guard Gait Distance (Feet): 150 Feet Assistive device: Rolling walker (2 wheeled) Gait Pattern/deviations: Step-through pattern;Step-to pattern;Antalgic;Decreased stride length     General Gait Details: mild antalgia on the L, cues for sequence and walker use  Stairs            Wheelchair Mobility    Modified Rankin (Stroke Patients Only)       Balance Overall balance assessment: Mild deficits observed, not formally tested                                           Pertinent Vitals/Pain Pain Assessment:  0-10 Pain Score: 1  Pain Location: L hip Pain Descriptors / Indicators: Discomfort Pain Intervention(s): Monitored during session;Repositioned    Home Living Family/patient expects to be discharged to:: Private residence Living Arrangements: Alone Available Help at Discharge: Family;Available 24 hours/day Type of Home: House Home Access: Stairs to enter Entrance Stairs-Rails: None Entrance Stairs-Number of Steps: 2 Home Layout: One level Home Equipment: Walker - 2 wheels;Hand held shower head;Shower seat - built in Additional Comments: will stay at mothers house    Prior Function Level of Independence: Independent         Comments: works at [employer]: Right    Extremity/Trunk Assessment   Upper Extremity Assessment Upper Extremity Assessment: Overall WFL for tasks assessed    Lower Extremity Assessment Lower Extremity Assessment: Defer to PT evaluation LLE Deficits / Details: AAROM Grossly WFL, strength hip flex 2/5, knee extension 4/5       Communication   Communication: No difficulties  Cognition Arousal/Alertness: Awake/alert Behavior During Therapy: WFL for tasks assessed/performed Overall Cognitive Status: Within Functional Limits for tasks assessed                                        General Comments      Exercises Total Joint Exercises Ankle Circles/Pumps:  AROM;10 reps;Both;Supine Quad Sets: AROM;Strengthening;10 reps;Supine;Both Short Arc Quad: AROM;Strengthening;10 reps;Supine;Left Heel Slides: AROM;AAROM;Both;10 reps;Supine Hip ABduction/ADduction: AAROM;AROM;10 reps;Left;Supine   Assessment/Plan    PT Assessment Patient needs continued PT services  PT Problem List Decreased strength;Pain;Decreased range of motion;Decreased mobility;Decreased knowledge of use of DME;Decreased activity tolerance       PT Treatment Interventions DME instruction;Functional mobility training;Patient/family  education;Gait training;Therapeutic activities;Stair training;Therapeutic exercise    PT Goals (Current goals can be found in the Care Plan section)  Acute Rehab PT Goals Patient Stated Goal:  to resume walking program PT Goal Formulation: With patient Time For Goal Achievement: 12/12/18 Potential to Achieve Goals: Good    Frequency 7X/week   Barriers to discharge        Co-evaluation               AM-PAC PT "6 Clicks" Mobility  Outcome Measure Help needed turning from your back to your side while in a flat bed without using bedrails?: A Little Help needed moving from lying on your back to sitting on the side of a flat bed without using bedrails?: A Little Help needed moving to and from a bed to a chair (including a wheelchair)?: A Little Help needed standing up from a chair using your arms (e.g., wheelchair or bedside chair)?: A Little Help needed to walk in hospital room?: A Little Help needed climbing 3-5 steps with a railing? : A Little 6 Click Score: 18    End of Session Equipment Utilized During Treatment: Gait belt Activity Tolerance: Patient tolerated treatment well Patient left: with call bell/phone within reach;in chair   PT Visit Diagnosis: Difficulty in walking, not elsewhere classified (R26.2);Pain Pain - Right/Left: Left Pain - part of body: Hip    Time: 6256-3893 PT Time Calculation (min) (ACUTE ONLY): 26 min   Charges:   PT Evaluation $PT Eval Low Complexity: 1 Low PT Treatments $Gait Training: 8-22 mins        Magda Kiel, [place] [facility] [phone] 12/09/2018   Reginia Naas 12/09/2018, 11:51 AM

## 2018-12-09 NOTE — Care Management Note (Signed)
Case Management Note  Patient Details  Name: Angela Lin MRN: [id number] Date of Birth: 1961-03-18  Subjective/Objective:    S/p Left THA                Action/Plan: NCM spoke to pt at bedside. Offered choice for HH/CMS list provided/placed on chart. Pt states she had [facility] in the past. She has RW at home. Declined 3n1 bedside commode. Husband will be at home to assist with care.   Expected Discharge Date:  12/09/18               Expected Discharge Plan:  [place]  In-House Referral:  NA  Discharge planning Services  CM Consult  Post Acute Care Choice:  Home Health Choice offered to:  Patient  DME Arranged:  N/A DME Agency:  NA  HH Arranged:  PT [place] Agency:  [facility] (formerly [employer])  Status of Service:  Completed, signed off  If discussed at [employer], dates discussed:    Additional Comments:  Erenest Rasher, RN 12/09/2018, 12:51 PM

## 2018-12-09 NOTE — Progress Notes (Signed)
Occupational Therapy Evaluation Patient Details Name: Angela Lin MRN: [id number] DOB: April 20, 1961 Today's Date: 12/09/2018    History of Present Illness Patient is a 57 y/o female now s/p L direct anterior THA.  PMH positive for R THA, AVN of both hips and tobacco use.   Clinical Impression   All OT education completed and pt questions answered. No further OT needs identified; will sign off.    Follow Up Recommendations  Supervision - Intermittent    Equipment Recommendations  3 in 1 bedside commode    Recommendations for Other Services PT consult     Precautions / Restrictions Precautions Precautions: Fall Restrictions Weight Bearing Restrictions: No Other Position/Activity Restrictions: WBAT      Mobility Bed Mobility Overal bed mobility: Needs Assistance Bed Mobility: Supine to Sit     Supine to sit: Supervision;HOB elevated     General bed mobility comments: NT - up in chair  Transfers Overall transfer level: Needs assistance Equipment used: Rolling walker (2 wheeled) Transfers: Sit to/from Stand Sit to Stand: Supervision         General transfer comment: for balance    Balance Overall balance assessment: Mild deficits observed, not formally tested                                         ADL either performed or assessed with clinical judgement   ADL Overall ADL's : Needs assistance/impaired                                       General ADL Comments: Patient reports toileting with supervision of nursing staff using 3 in 1 over toilet. Verbal education of shower transfer; pt reports she will sponge bathe at first. Patient needed min A to don shoes; otherwise she dressed herself in regular clothes with setup only.     Vision         Perception     Praxis      Pertinent Vitals/Pain Pain Assessment: 0-10 Pain Score: 1  Pain Location: L hip Pain Descriptors / Indicators: Discomfort Pain  Intervention(s): Monitored during session;Ice applied     Hand Dominance Right   Extremity/Trunk Assessment Upper Extremity Assessment Upper Extremity Assessment: Overall WFL for tasks assessed   Lower Extremity Assessment Lower Extremity Assessment: Defer to PT evaluation LLE Deficits / Details: AAROM Grossly WFL, strength hip flex 2/5, knee extension 4/5       Communication Communication Communication: No difficulties   Cognition Arousal/Alertness: Awake/alert Behavior During Therapy: WFL for tasks assessed/performed Overall Cognitive Status: Within Functional Limits for tasks assessed                                     General Comments       Exercises Total Joint Exercises Ankle Circles/Pumps: AROM;10 reps;Both;Supine Quad Sets: AROM;Strengthening;10 reps;Supine;Both Short Arc Quad: AROM;Strengthening;10 reps;Supine;Left Heel Slides: AROM;AAROM;Both;10 reps;Supine Hip ABduction/ADduction: AAROM;AROM;10 reps;Left;Supine   Shoulder Instructions      Home Living Family/patient expects to be discharged to:: Private residence Living Arrangements: Alone Available Help at Discharge: Family;Available 24 hours/day Type of Home: House Home Access: Stairs to enter [employer] of Steps: 2 Entrance Stairs-Rails: None Home Layout: One level     Bathroom  Shower/Tub: Teacher, Angela Lin years/pre: Handicapped height     Home Equipment: Environmental consultant - 2 wheels;Hand held shower head   Additional Comments: will stay at mothers house      Prior Functioning/Environment Level of Independence: Independent        Comments: works at [employer] List: Decreased strength;Decreased activity tolerance;Decreased knowledge of use of DME or AE;Pain      OT Treatment/Interventions:      OT Goals(Current goals can be found in the care plan section) Acute Rehab OT Goals Patient Stated Goal:  to resume walking program OT Goal  Formulation: All assessment and education complete, DC therapy  OT Frequency:     Barriers to D/C:            Co-evaluation              AM-PAC OT "6 Clicks" Daily Activity     Outcome Measure Help from another person eating meals?: None Help from another person taking care of personal grooming?: None Help from another person toileting, which includes using toliet, bedpan, or urinal?: A Little Help from another person bathing (including washing, rinsing, drying)?: A Little Help from another person to put on and taking off regular upper body clothing?: None Help from another person to put on and taking off regular lower body clothing?: A Little 6 Click Score: 21   End of Session Equipment Utilized During Treatment: Rolling walker Nurse Communication: Mobility status  Activity Tolerance: Patient tolerated treatment well Patient left: in chair;with call bell/phone within reach  OT Visit Diagnosis: Unsteadiness on feet (R26.81)                Time: 4496-7591 OT Time Calculation (min): 27 min Charges:  OT General Charges $OT Visit: 1 Visit OT Evaluation $OT Eval Low Complexity: 1 Low OT Treatments $Self Care/Home Management : 8-22 mins    Angela Lin A Angela Lin 12/09/2018, 2:06 PM

## 2018-12-09 NOTE — Progress Notes (Signed)
Subjective: 1 Day Post-Op Procedure(s) (LRB): LEFT TOTAL HIP ARTHROPLASTY ANTERIOR APPROACH (Left) Patient reports pain as mild.    Objective: Vital signs in last 24 hours: Temp:  [97.3 F (36.3 C)-98.4 F (36.9 C)] 98.1 F (36.7 C) (12/14 1045) Pulse Rate:  [61-103] 65 (12/14 1045) Resp:  [15-21] 16 (12/14 1045) BP: (137-196)/(67-108) 164/76 (12/14 1045) SpO2:  [96 %-100 %] 100 % (12/14 1045) Weight:  [75.8 kg] 75.8 kg (12/13 1244)  Intake/Output from previous day: 12/13 0701 - 12/14 0700 In: 2397.9 [I.V.:2098.3; IV Piggyback:299.6] Out: 2275 [Urine:2025; Blood:250] Intake/Output this shift: Total I/O In: 360 [P.O.:360] Out: 900 [Urine:900]  Recent Labs    12/09/18 0407  HGB 13.1   Recent Labs    12/09/18 0407  WBC 10.7*  RBC 4.20  HCT 38.8  PLT 218   Recent Labs    12/09/18 0407  NA 133*  K 3.8  CL 100  CO2 25  BUN 9  CREATININE 0.68  GLUCOSE 153*  CALCIUM 8.7*   No results for input(s): LABPT, INR in the last 72 hours.  Sensation intact distally Intact pulses distally Dorsiflexion/Plantar flexion intact Incision: dressing C/D/I  Assessment/Plan: 1 Day Post-Op Procedure(s) (LRB): LEFT TOTAL HIP ARTHROPLASTY ANTERIOR APPROACH (Left) Up with therapy Discharge home with home health this afternoon.    Mcarthur Rossetti 12/09/2018, 11:28 AM

## 2018-12-09 NOTE — Discharge Summary (Signed)
Patient ID: Angela Lin MRN: [id number] DOB/AGE: 09/13/1961 57 y.o.  Admit date: 12/08/2018 Discharge date: 12/09/2018  Admission Diagnoses:  Principal Problem:   Unilateral primary osteoarthritis, left hip Active Problems:   Status post total replacement of left hip   Discharge Diagnoses:  Same  Past Medical History:  Diagnosis Date  . Arthritis    osteoarthritis- hip  . Endometriosis   . Heart murmur    as a child, reports as benign   . Sinus congestion    allergies     Surgeries: Procedure(s): LEFT TOTAL HIP ARTHROPLASTY ANTERIOR APPROACH on 12/08/2018   Consultants:   Discharged Condition: Improved  Hospital Course: Angela Lin is an 57 y.o. female who was admitted 12/08/2018 for operative treatment ofUnilateral primary osteoarthritis, left hip. Patient has severe unremitting pain that affects sleep, daily activities, and work/hobbies. After pre-op clearance the patient was taken to the operating room on 12/08/2018 and underwent  Procedure(s): LEFT TOTAL HIP ARTHROPLASTY ANTERIOR APPROACH.    Patient was given perioperative antibiotics:  Anti-infectives (From admission, onward)   Start     Dose/Rate Route Frequency Ordered Stop   12/08/18 2100  ceFAZolin (ANCEF) IVPB 1 g/50 mL premix     1 g 100 mL/hr over 30 Minutes Intravenous Every 6 hours 12/08/18 1808 12/09/18 0320   12/08/18 1245  ceFAZolin (ANCEF) IVPB 2g/100 mL premix     2 g 200 mL/hr over 30 Minutes Intravenous On call to O.R. 12/08/18 1234 12/08/18 1443   12/08/18 1240  ceFAZolin (ANCEF) 2-4 GM/100ML-% IVPB    Note to Pharmacy:  Waldron Session   : cabinet override      12/08/18 1240 12/08/18 1443       Patient was given sequential compression devices, early ambulation, and chemoprophylaxis to prevent DVT.  Patient benefited maximally from hospital stay and there were no complications.    Recent vital signs:  Patient Vitals for the past 24 hrs:  BP Temp Temp src Pulse Resp  SpO2 Height Weight  12/09/18 1045 (!) 164/76 98.1 F (36.7 C) Oral 65 16 100 % - -  12/09/18 0701 (!) 170/88 (!) 97.4 F (36.3 C) Oral 70 16 97 % - -  12/09/18 0600 (!) 170/88 - - - - - - -  12/09/18 0254 (!) 153/83 (!) 97.3 F (36.3 C) Oral 82 16 96 % - -  12/08/18 2207 (!) 161/93 98.4 F (36.9 C) Oral 78 16 97 % - -  12/08/18 1852 (!) 172/102 98.2 F (36.8 C) Oral 85 16 98 % - -  12/08/18 1811 (!) 196/108 98 F (36.7 C) Oral 81 16 100 % - -  12/08/18 1745 (!) 177/87 - - 61 16 100 % - -  12/08/18 1730 (!) 153/96 - - 65 20 99 % - -  12/08/18 1715 (!) 167/97 - - 64 15 100 % - -  12/08/18 1700 (!) 145/83 97.6 F (36.4 C) - 62 20 99 % - -  12/08/18 1645 (!) 149/80 - - 68 18 97 % - -  12/08/18 1630 140/67 - - 77 (!) 21 97 % - -  12/08/18 1621 137/77 97.6 F (36.4 C) - (!) 103 20 100 % - -  12/08/18 1244 - - - - - - 5\' 5"  (1.651 m) 75.8 kg  12/08/18 1215 (!) 154/86 98 F (36.7 C) Oral 80 18 100 % - -     Recent laboratory studies:  Recent Labs    12/09/18 0407  WBC  10.7*  HGB 13.1  HCT 38.8  PLT 218  NA 133*  K 3.8  CL 100  CO2 25  BUN 9  CREATININE 0.68  GLUCOSE 153*  CALCIUM 8.7*     Discharge Medications:   Allergies as of 12/09/2018   No Known Allergies     Medication List    TAKE these medications   aspirin 81 MG chewable tablet Chew 1 tablet (81 mg total) by mouth 2 (two) times daily.   BLACK COHOSH PO Take 1 capsule by mouth at bedtime.   diphenhydrAMINE 25 mg capsule Commonly known as:  BENADRYL Take 25 mg by mouth at bedtime.   GLUCOSAMINE-MSM PO Take 2 tablets by mouth daily.   HAIR/SKIN/NAILS PO Take 1 tablet by mouth daily.   ibuprofen 200 MG tablet Commonly known as:  ADVIL,MOTRIN Take 200 mg by mouth every 6 (six) hours as needed for headache or moderate pain.   methocarbamol 500 MG tablet Commonly known as:  ROBAXIN Take 1 tablet (500 mg total) by mouth every 6 (six) hours as needed for muscle spasms.   milk thistle 175 MG  tablet Take 175 mg by mouth 2 (two) times daily.   oxyCODONE 5 MG immediate release tablet Commonly known as:  Oxy IR/ROXICODONE Take 1-2 tablets (5-10 mg total) by mouth every 4 (four) hours as needed for moderate pain (pain score 4-6).   VISINE 0.05 % ophthalmic solution Generic drug:  tetrahydrozoline Place 2 drops into both eyes daily.   Vitamin D3 50 MCG (2000 UT) Tabs Take 2,000 Units by mouth daily.            Durable Medical Equipment  (From admission, onward)         Start     Ordered   12/08/18 1809  DME 3 n 1  Once     12/08/18 1808   12/08/18 1809  DME Walker rolling  Once    Question:  Patient needs a walker to treat with the following condition  Answer:  Status post total replacement of left hip   12/08/18 1808          Diagnostic Studies: Dg Pelvis Portable  Result Date: 12/08/2018 CLINICAL DATA:  Status post left hip replacement EXAM: PORTABLE PELVIS 1-2 VIEWS COMPARISON:  None. FINDINGS: The patient is status post left hip replacement. The acetabular and femoral components are in good position. Soft tissue gas is consistent with recent surgery. A more remote right hip replacement is noted as well. IMPRESSION: Status post left hip replacement in the interval. Electronically Signed   By: Angela Lin III M.D   On: 12/08/2018 16:58   Dg C-arm 1-60 Min-no Report  Result Date: 12/08/2018 Fluoroscopy was utilized by the requesting physician.  No radiographic interpretation.   Dg Hip Operative Unilat With Pelvis Left  Result Date: 12/08/2018 CLINICAL DATA:  Left hip arthroplasty EXAM: OPERATIVE LEFT HIP WITH PELVIS COMPARISON:  None. FLUOROSCOPY TIME:  Radiation Exposure Index (as provided by the fluoroscopic device): 3.72 mGy If the device does not provide the exposure index: Fluoroscopy Time:  29 seconds Number of Acquired Images:  2 FINDINGS: Left hip prosthesis is noted in satisfactory position. No acute bony abnormality is seen. IMPRESSION: Status  post left hip replacement Electronically Signed   By: Angela Lin M.D.   On: 12/08/2018 16:02    Disposition: Discharge disposition: 01-Home or Self Care         Follow-up Information    Angela Rossetti, MD  Follow up in 2 week(s).   Specialty:  Orthopedic Surgery Contact information: [place] [address] [phone]            Signed: Mcarthur Lin 12/09/2018, 11:30 AM

## 2018-12-09 NOTE — Discharge Instructions (Signed)

## 2018-12-11 ENCOUNTER — Encounter (HOSPITAL_COMMUNITY): Payer: Self-pay | Admitting: Orthopaedic Surgery

## 2018-12-11 DIAGNOSIS — Z471 Aftercare following joint replacement surgery: Secondary | ICD-10-CM | POA: Diagnosis not present

## 2018-12-11 DIAGNOSIS — F17210 Nicotine dependence, cigarettes, uncomplicated: Secondary | ICD-10-CM | POA: Diagnosis not present

## 2018-12-11 DIAGNOSIS — Z9181 History of falling: Secondary | ICD-10-CM | POA: Diagnosis not present

## 2018-12-11 DIAGNOSIS — Z96643 Presence of artificial hip joint, bilateral: Secondary | ICD-10-CM | POA: Diagnosis not present

## 2018-12-11 DIAGNOSIS — Z7982 Long term (current) use of aspirin: Secondary | ICD-10-CM | POA: Diagnosis not present

## 2018-12-12 DIAGNOSIS — Z471 Aftercare following joint replacement surgery: Secondary | ICD-10-CM | POA: Diagnosis not present

## 2018-12-12 DIAGNOSIS — Z96643 Presence of artificial hip joint, bilateral: Secondary | ICD-10-CM | POA: Diagnosis not present

## 2018-12-12 DIAGNOSIS — F17210 Nicotine dependence, cigarettes, uncomplicated: Secondary | ICD-10-CM | POA: Diagnosis not present

## 2018-12-12 DIAGNOSIS — Z7982 Long term (current) use of aspirin: Secondary | ICD-10-CM | POA: Diagnosis not present

## 2018-12-12 DIAGNOSIS — Z9181 History of falling: Secondary | ICD-10-CM | POA: Diagnosis not present

## 2018-12-12 NOTE — Progress Notes (Addendum)
57 y.o. G0P0000 Married White or Caucasian female here for annual exam.  Patient had her left hip replace 12/08/18.  Doing well.  Taking ibuprofen only now.  Off narcotics after three or four days post-op.  Denies vaginal bleeding bleeding.    PCP:  Dr. Addison Lank.  I do have lab work from 11/17.  Pt is not sure if she's had any additional blood work other than pre-surgical since that time.    Patient's last menstrual period was 08/27/2008 (approximate).          Sexually active: Yes.    The current method of family planning is post menopausal status.    Exercising: Yes.    Walking as she can.  Smoker:  yes  Health Maintenance: Pap: 11/10/17 Neg HR HPV Neg 11/04/16 Neg. HR HPV+Detected. 16/18/45 neg  History of abnormal Pap:  yes MMG:  12/30/16 Density C / Bi-rads 2 benign  Colonoscopy:  Declines.  Cologuard was ordered last year.  She states she will not do this right now.   BMD:   Never TDaP:  2017 Pneumonia vaccine(s):  2015 Shingrix:   n/a Hep C testing: 11/03/16 Neg Screening Labs: PCP, Hb today: PCP, Urine today: none   reports that she has been smoking cigarettes. She has a 20.00 pack-year smoking history. She has never used smokeless tobacco. She reports current alcohol use. She reports that she does not use drugs.  Past Medical History:  Diagnosis Date  . Arthritis    osteoarthritis- hip  . Endometriosis   . Heart murmur    as a child, reports as benign   . Sinus congestion    allergies     Past Surgical History:  Procedure Laterality Date  . DIAGNOSTIC LAPAROSCOPY     endometriosis- 20 yrs ago  . TOTAL HIP ARTHROPLASTY Right 06/04/2016   Procedure: RIGHT TOTAL HIP ARTHROPLASTY ANTERIOR APPROACH;  Surgeon: Mcarthur Rossetti, MD;  Location: [facility];  Service: Orthopedics;  Laterality: Right;  . TOTAL HIP ARTHROPLASTY Left 12/08/2018   Procedure: LEFT TOTAL HIP ARTHROPLASTY ANTERIOR APPROACH;  Surgeon: Mcarthur Rossetti, MD;  Location: [facility];  Service:  Orthopedics;  Laterality: Left;    Current Outpatient Medications  Medication Sig Dispense Refill  . Biotin w/ Vitamins C & E (HAIR/SKIN/NAILS PO) Take 1 tablet by mouth daily.    Marland Kitchen BLACK COHOSH PO Take 1 capsule by mouth at bedtime.    . Cholecalciferol (VITAMIN D3) 50 MCG (2000 UT) TABS Take 2,000 Units by mouth daily.    . diphenhydrAMINE (BENADRYL) 25 mg capsule Take 25 mg by mouth at bedtime.    . Glucosamine HCl-MSM (GLUCOSAMINE-MSM PO) Take 2 tablets by mouth daily.    Marland Kitchen ibuprofen (ADVIL,MOTRIN) 200 MG tablet Take 200 mg by mouth every 6 (six) hours as needed for headache or moderate pain.     . milk thistle 175 MG tablet Take 175 mg by mouth 2 (two) times daily.     Marland Kitchen tetrahydrozoline (VISINE) 0.05 % ophthalmic solution Place 2 drops into both eyes daily.     No current facility-administered medications for this visit.     Family History  Problem Relation Age of Onset  . Cancer Father        Lung cancer- passed 2005     Review of Systems  Hematological: Bruises/bleeds easily.    Exam:   BP 130/64   Pulse 66   Resp 14   Ht 5' 4.5" (1.638 m)   Wt 167 lb (  75.8 kg)   LMP 08/27/2008 (Approximate)   BMI 28.22 kg/m   Height: 5' 4.5" (163.8 cm)  Ht Readings from Last 3 Encounters:  12/21/18 5' 4.5" (1.638 m)  12/08/18 5\' 5"  (1.651 m)  12/04/18 5\' 5"  (1.651 m)    General appearance: alert, cooperative and appears stated age Head: Normocephalic, without obvious abnormality, atraumatic Neck: no adenopathy, supple, symmetrical, trachea midline and thyroid normal to inspection and palpation Lungs: clear to auscultation bilaterally Breasts: normal appearance, no masses or tenderness Heart: regular rate and rhythm Abdomen: soft, non-tender; bowel sounds normal; no masses,  no organomegaly Extremities: extremities normal, atraumatic, no cyanosis or edema Skin: Skin color, texture, turgor normal. No rashes or lesions Lymph nodes: Cervical, supraclavicular, and axillary nodes  normal. No abnormal inguinal nodes palpated Neurologic: Grossly normal  Pelvic: External genitalia:  no lesions              Urethra:  normal appearing urethra with no masses, tenderness or lesions              Bartholins and Skenes: normal                 Vagina: normal appearing vagina with normal color and discharge, no lesions              Cervix: no lesions              Pap taken: Yes.   Bimanual Exam:  Uterus:  normal size, contour, position, consistency, mobility, non-tender              Adnexa: normal adnexa and no mass, fullness, tenderness               Rectovaginal: Confirms               Anus:  normal sphincter tone, no lesions  Chaperone was present for exam.  A:  Well Woman with normal exam PMP, no HRT H/o endometriosis with laparoscopy H/o +HR HPV 2017, neg pap and neg HR HPV 2018 Hip replacement with Dr. Rush Farmer two weeks ago.  P:   Mammogram is overdue.  Pt aware.  States she will call and schedule pap smear with HR HPV obtained today.   Declines colonoscopy, IFOB, and cologuard.  Aware I highly recommend some type of colorectal cancer screening.   Shingrix vaccination discussed.  Information give.   Lab work done with Dr. Addison Lank return annually or prn

## 2018-12-13 DIAGNOSIS — Z471 Aftercare following joint replacement surgery: Secondary | ICD-10-CM | POA: Diagnosis not present

## 2018-12-13 DIAGNOSIS — Z7982 Long term (current) use of aspirin: Secondary | ICD-10-CM | POA: Diagnosis not present

## 2018-12-13 DIAGNOSIS — Z96643 Presence of artificial hip joint, bilateral: Secondary | ICD-10-CM | POA: Diagnosis not present

## 2018-12-13 DIAGNOSIS — F17210 Nicotine dependence, cigarettes, uncomplicated: Secondary | ICD-10-CM | POA: Diagnosis not present

## 2018-12-13 DIAGNOSIS — Z9181 History of falling: Secondary | ICD-10-CM | POA: Diagnosis not present

## 2018-12-14 DIAGNOSIS — Z471 Aftercare following joint replacement surgery: Secondary | ICD-10-CM | POA: Diagnosis not present

## 2018-12-14 DIAGNOSIS — F17210 Nicotine dependence, cigarettes, uncomplicated: Secondary | ICD-10-CM | POA: Diagnosis not present

## 2018-12-14 DIAGNOSIS — Z96643 Presence of artificial hip joint, bilateral: Secondary | ICD-10-CM | POA: Diagnosis not present

## 2018-12-14 DIAGNOSIS — Z7982 Long term (current) use of aspirin: Secondary | ICD-10-CM | POA: Diagnosis not present

## 2018-12-14 DIAGNOSIS — Z9181 History of falling: Secondary | ICD-10-CM | POA: Diagnosis not present

## 2018-12-15 DIAGNOSIS — Z7982 Long term (current) use of aspirin: Secondary | ICD-10-CM | POA: Diagnosis not present

## 2018-12-15 DIAGNOSIS — F17210 Nicotine dependence, cigarettes, uncomplicated: Secondary | ICD-10-CM | POA: Diagnosis not present

## 2018-12-15 DIAGNOSIS — Z96643 Presence of artificial hip joint, bilateral: Secondary | ICD-10-CM | POA: Diagnosis not present

## 2018-12-15 DIAGNOSIS — Z9181 History of falling: Secondary | ICD-10-CM | POA: Diagnosis not present

## 2018-12-15 DIAGNOSIS — Z471 Aftercare following joint replacement surgery: Secondary | ICD-10-CM | POA: Diagnosis not present

## 2018-12-21 ENCOUNTER — Encounter (INDEPENDENT_AMBULATORY_CARE_PROVIDER_SITE_OTHER): Payer: Self-pay | Admitting: Orthopaedic Surgery

## 2018-12-21 ENCOUNTER — Encounter: Payer: Self-pay | Admitting: Obstetrics & Gynecology

## 2018-12-21 ENCOUNTER — Other Ambulatory Visit (HOSPITAL_COMMUNITY)
Admission: RE | Admit: 2018-12-21 | Discharge: 2018-12-21 | Disposition: A | Discharge status: Home / Self Care | Payer: BLUE CROSS/BLUE SHIELD | Source: Ambulatory Visit | Attending: Obstetrics & Gynecology | Admitting: Obstetrics & Gynecology

## 2018-12-21 ENCOUNTER — Ambulatory Visit (INDEPENDENT_AMBULATORY_CARE_PROVIDER_SITE_OTHER): Payer: BLUE CROSS/BLUE SHIELD | Admitting: Orthopaedic Surgery

## 2018-12-21 ENCOUNTER — Ambulatory Visit (INDEPENDENT_AMBULATORY_CARE_PROVIDER_SITE_OTHER): Payer: BLUE CROSS/BLUE SHIELD | Admitting: Obstetrics & Gynecology

## 2018-12-21 VITALS — BP 130/64 | HR 66 | Resp 14 | Ht 64.5 in | Wt 167.0 lb

## 2018-12-21 DIAGNOSIS — B977 Papillomavirus as the cause of diseases classified elsewhere: Secondary | ICD-10-CM | POA: Insufficient documentation

## 2018-12-21 DIAGNOSIS — Z124 Encounter for screening for malignant neoplasm of cervix: Secondary | ICD-10-CM

## 2018-12-21 DIAGNOSIS — Z96642 Presence of left artificial hip joint: Secondary | ICD-10-CM

## 2018-12-21 DIAGNOSIS — Z01419 Encounter for gynecological examination (general) (routine) without abnormal findings: Secondary | ICD-10-CM | POA: Diagnosis not present

## 2018-12-21 NOTE — Progress Notes (Signed)
The patient is 2 weeks tomorrow status post a left total hip arthroplasty.  She is ambulating with a cane and doing well.  She is already off all pain medication and stop her aspirin.  She is 2 years status post her right hip pain replaced.  She has no issues at all she states.  On exam her incision looks good.  I did place new Steri-Strips.  All question concerns were answered and addressed.  She will continue increase her activities as comfort allows.  We will see her back in 4 weeks to see how she is doing overall but no x-rays are needed.  At that hip is doing well at that visit we would not need to see her back for 6 months to year.

## 2018-12-21 NOTE — Patient Instructions (Signed)
Outpatient Pharmacy at [facility] [address] [place], [place], [place] [address]  Phone: [phone]

## 2018-12-25 LAB — CYTOLOGY - PAP
Diagnosis: NEGATIVE
HPV: NOT DETECTED

## 2018-12-26 ENCOUNTER — Telehealth: Payer: Self-pay

## 2019-01-02 NOTE — Telephone Encounter (Signed)
Opened in error

## 2019-02-01 ENCOUNTER — Ambulatory Visit: Payer: BLUE CROSS/BLUE SHIELD | Admitting: Obstetrics & Gynecology

## 2019-06-05 DIAGNOSIS — J329 Chronic sinusitis, unspecified: Secondary | ICD-10-CM | POA: Diagnosis not present

## 2019-06-05 DIAGNOSIS — F17200 Nicotine dependence, unspecified, uncomplicated: Secondary | ICD-10-CM | POA: Diagnosis not present

## 2019-08-13 DIAGNOSIS — R05 Cough: Secondary | ICD-10-CM | POA: Diagnosis not present

## 2019-10-16 ENCOUNTER — Ambulatory Visit (INDEPENDENT_AMBULATORY_CARE_PROVIDER_SITE_OTHER): Payer: BC Managed Care – PPO | Admitting: Allergy & Immunology

## 2019-10-16 ENCOUNTER — Encounter: Payer: Self-pay | Admitting: Allergy & Immunology

## 2019-10-16 ENCOUNTER — Other Ambulatory Visit: Payer: Self-pay

## 2019-10-16 VITALS — BP 190/100 | HR 90 | Temp 98.0°F | Resp 18 | Ht 65.0 in | Wt 167.2 lb

## 2019-10-16 DIAGNOSIS — J310 Chronic rhinitis: Secondary | ICD-10-CM

## 2019-10-16 DIAGNOSIS — R05 Cough: Secondary | ICD-10-CM

## 2019-10-16 DIAGNOSIS — K9049 Malabsorption due to intolerance, not elsewhere classified: Secondary | ICD-10-CM | POA: Diagnosis not present

## 2019-10-16 DIAGNOSIS — R059 Cough, unspecified: Secondary | ICD-10-CM

## 2019-10-16 MED ORDER — ALBUTEROL SULFATE HFA 108 (90 BASE) MCG/ACT IN AERS: 2.0000 | INHALATION_SPRAY | Freq: Four times a day (QID) | RESPIRATORY_TRACT | 1 refills | Status: DC | PRN

## 2019-10-16 MED ORDER — BUDESONIDE-FORMOTEROL FUMARATE 80-4.5 MCG/ACT IN AERO: 2.0000 | INHALATION_SPRAY | Freq: Two times a day (BID) | RESPIRATORY_TRACT | 1 refills | Status: DC

## 2019-10-16 NOTE — Progress Notes (Signed)
NEW PATIENT  Date of Service/Encounter:  10/16/19  Referring provider: Cari Caraway, MD   Assessment:   Chronic non-allergic rhinitis  Food intolerance  Cough  Plan/Recommendations:   1. Chronic rhinitis - Testing was negative to the entire panel. - This fits with your history of no improvement off of your allergy medications. - I would recommend nasal saline rinses to see if that helps (or get that "as seen on TV" product!)  2. Coughing - asthma/COPD overlap - Lung testing was slightly low today and it did improve somewhat with the Xopenex treatment. - This points towards a diagnosis of asthma, but it does not confirm it. - We are going to start you on a daily inhaled medication to see if this helps: Symbicort - Symbicort contains a long acting albuterol combined with an inhaled steroid. - If your symptoms are coming from uncontrolled COPD/asthma, this would improve your symptoms.  - Spacer sample and demonstration provided. - Daily controller medication(s): Symbicort 80/4.52mcg two puffs twice daily with spacer - Prior to physical activity: albuterol 2 puffs 10-15 minutes before physical activity. - Rescue medications: albuterol 4 puffs every 4-6 hours as needed - Asthma control goals:  * Full participation in all desired activities (may need albuterol before activity) * Albuterol use two time or less a week on average (not counting use with activity) * Cough interfering with sleep two time or less a month * Oral steroids no more than once a year * No hospitalizations  3. Food intolerance - Continue to avoid cow's milk. - Symptoms are more consistent with a lactose intolerance.   4. Return in about 2 months (around 12/16/2019). This can be an in-person, a virtual Webex or a telephone follow up visit.   Subjective:   Angela Lin is a 58 y.o. female presenting today for evaluation of  Chief Complaint  Patient presents with  . Cough    Angela Lin  Angela Lin has a history of the following: Patient Active Problem List   Diagnosis Date Noted  . Unilateral primary osteoarthritis, left hip 12/08/2018  . Status post total replacement of left hip 12/08/2018  . Osteoarthritis of right hip 06/04/2016  . Avascular necrosis of bones of both hips ([employer]) 06/04/2016  . Status post total replacement of right hip 06/04/2016    History obtained from: chart review and patient.  Angela Lin was referred by Cari Caraway, MD.     Angela Lin is a 58 y.o. female presenting for an evaluation of a chronic cough.  She has tried Flonase and azithromycin. She has not gotten a CXR. She has not had a fever. She has never been diagnosed with COPD. She has never had an inhaler at all. Symptoms do tend to worsen during particular times of the year. She is allergic to ragweed. She can take antihistamine to dry things out, but it never really completely gets rid of it. She can be sitting in a room and start coughing. Sleeping at night tends to make it worse too. She has raised up her bed and done Vicks.   There were no changes in December aside from the increased rain fall. She does know that there is mold under the home. She is getting a sump pump put in too. She does smoke and has smoked way too long.  She did remove milk from her diet due to increased mucous production and bloating. She feels better without cow's milk in her diet. She is using almond milk. She  has never had hives or breathing problems.    Otherwise, there is no history of other atopic diseases, including food allergies, drug allergies, stinging insect allergies, eczema, urticaria or contact dermatitis. There is no significant infectious history. Vaccinations are up to date.    Past Medical History: Patient Active Problem List   Diagnosis Date Noted  . Unilateral primary osteoarthritis, left hip 12/08/2018  . Status post total replacement of left hip 12/08/2018  . Osteoarthritis of  right hip 06/04/2016  . Avascular necrosis of bones of both hips ([place]) 06/04/2016  . Status post total replacement of right hip 06/04/2016    Medication List:  Allergies as of 10/16/2019   No Known Allergies     Medication List       Accurate as of October 16, 2019 12:28 PM. If you have any questions, ask your nurse or doctor.        BLACK COHOSH PO Take 1 capsule by mouth at bedtime.   diphenhydrAMINE 25 mg capsule Commonly known as: BENADRYL Take 25 mg by mouth at bedtime.   fluticasone 50 MCG/ACT nasal spray Commonly known as: FLONASE USE 1 TO 2 SPRAY(S) IN EACH NOSTRIL ONCE DAILY   GLUCOSAMINE-MSM PO Take 2 tablets by mouth daily.   HAIR/SKIN/NAILS PO Take 1 tablet by mouth daily.   ibuprofen 200 MG tablet Commonly known as: ADVIL Take 200 mg by mouth every 6 (six) hours as needed for headache or moderate pain.   milk thistle 175 MG tablet Take 175 mg by mouth 2 (two) times daily.   Visine 0.05 % ophthalmic solution Generic drug: tetrahydrozoline Place 2 drops into both eyes daily.   Vitamin D3 50 MCG (2000 UT) Tabs Take 2,000 Units by mouth daily.       Birth History: non-contributory  Developmental History: non-contributory  Past Surgical History: Past Surgical History:  Procedure Laterality Date  . DIAGNOSTIC LAPAROSCOPY     endometriosis- 20 yrs ago  . TOTAL HIP ARTHROPLASTY Right 06/04/2016   Procedure: RIGHT TOTAL HIP ARTHROPLASTY ANTERIOR APPROACH;  Surgeon: Mcarthur Rossetti, MD;  Location: [facility];  Service: Orthopedics;  Laterality: Right;  . TOTAL HIP ARTHROPLASTY Left 12/08/2018   Procedure: LEFT TOTAL HIP ARTHROPLASTY ANTERIOR APPROACH;  Surgeon: Mcarthur Rossetti, MD;  Location: [facility];  Service: Orthopedics;  Laterality: Left;     Family History: Family History  Problem Relation Age of Onset  . Cancer Father        Lung cancer- passed 2005   . Hypertension Brother      Social History: Angela Lin lives at home with  her family.     ROS     Objective:   Blood pressure (!) 190/100, pulse 90, temperature 98 F (36.7 C), temperature source Temporal, resp. rate 18, height 5\' 5"  (1.651 m), weight 167 lb 3.2 oz (75.8 kg), last menstrual period 08/27/2008, SpO2 96 %. Body mass index is 27.82 kg/m.   Physical Exam:   Physical Exam  Constitutional: She appears well-developed.  HENT:  Head: Normocephalic and atraumatic.  Right Ear: Tympanic membrane, external ear and ear canal normal. No drainage, swelling or tenderness. Tympanic membrane is not injected, not scarred, not erythematous, not retracted and not bulging.  Left Ear: External ear and ear canal normal. No drainage, swelling or tenderness. Tympanic membrane is not injected, not scarred, not erythematous, not retracted and not bulging. A middle ear effusion is present.  Nose: Mucosal edema present. No rhinorrhea, nasal deformity or septal deviation. No epistaxis.  Right sinus exhibits no maxillary sinus tenderness and no frontal sinus tenderness. Left sinus exhibits no maxillary sinus tenderness and no frontal sinus tenderness.  Mouth/Throat: Uvula is midline and oropharynx is clear and moist. Mucous membranes are not pale and not dry.  There is some clear rhinorrhea bilaterally. Throat erythematous with some scant cobblestoning noted.   Eyes: Pupils are equal, round, and reactive to light. Conjunctivae and EOM are normal. Right eye exhibits no chemosis and no discharge. Left eye exhibits no chemosis and no discharge. Right conjunctiva is not injected. Left conjunctiva is not injected.  Cardiovascular: Normal rate, regular rhythm and normal heart sounds.  Respiratory: Effort normal and breath sounds normal. No accessory muscle usage. No tachypnea. No respiratory distress. She has no wheezes. She has no rhonchi. She has no rales. She exhibits no tenderness.  GI: There is no abdominal tenderness. There is no rebound and no guarding.  Lymphadenopathy:        Head (right side): No submandibular, no tonsillar and no occipital adenopathy present.       Head (left side): No submandibular, no tonsillar and no occipital adenopathy present.    She has no cervical adenopathy.  Neurological: She is alert.  Skin: No abrasion, no petechiae and no rash noted. Rash is not papular, not vesicular and not urticarial. No erythema. No pallor.  Psychiatric: She has a normal mood and affect.     Diagnostic studies:    Spirometry: results abnormal (FEV1: 2.02/74%, FVC: 2.68/77%, FEV1/FVC: 75%).    Spirometry consistent with possible restrictive disease. Xopenex MDI 4 puffd treatment given in clinic with improvement in FEV1 and FVC, but not significant per ATS criteria.  Allergy Studies:    Airborne Adult Perc - 10/16/19 0951    Time Antigen Placed  [id number]    Allergen Manufacturer  Lavella Hammock    Location  Back    Number of Test  59    Panel 1  Select    1. Control-Buffer 50% Glycerol  Negative    2. Control-Histamine 1 mg/ml  2+    3. Albumin saline  Negative    4. [place]  Negative    5. [place]  Negative    6. [place]  Negative    7. [place] Blue  Negative    8. Meadow Fescue  Negative    9. Perennial Rye  Negative    10. Sweet Vernal  Negative    11. Timothy  Negative    12. Cocklebur  Negative    13. Burweed Marshelder  Negative    14. Ragweed, short  Negative    15. Ragweed, Giant  Negative    16. Plantain,  English  Negative    17. Lamb's Quarters  Negative    18. Sheep Sorrell  Negative    19. Rough Pigweed  Negative    20. Marsh Elder, Rough  Negative    21. Mugwort, Common  Negative    22. Ash mix  Negative    23. Birch mix  Negative    24. Beech American  Negative    25. Box, Elder  Negative    26. Cedar, red  Negative    27. Cottonwood, [place]  Negative    28. Elm mix  Negative    29. Hickory mix  Negative    30. Maple mix  Negative    31. Oak, [place] mix  Negative    32. Pecan Pollen  Negative    33. Pine mix  Negative  [place]  Negative    35. [place], Black Pollen  Negative    36. Alternaria alternata  Negative    37. Cladosporium Herbarum  Negative    38. Aspergillus mix  Negative    39. Penicillium mix  Negative    40. Bipolaris sorokiniana (Helminthosporium)  Negative    41. Drechslera spicifera (Curvularia)  Negative    42. Mucor plumbeus  Negative    43. Fusarium moniliforme  Negative    44. Aureobasidium pullulans (pullulara)  Negative    45. Rhizopus oryzae  Negative    46. Botrytis cinera  Negative    47. Epicoccum nigrum  Negative    48. Phoma betae  Negative    49. Candida Albicans  Negative    50. Trichophyton mentagrophytes  Negative    51. Mite, D Farinae  5,000 AU/ml  Negative    52. Mite, D Pteronyssinus  5,000 AU/ml  Negative    53. Cat Hair 10,000 BAU/ml  Negative    54.  Dog Epithelia  Negative    55. Mixed Feathers  Negative    56. Horse Epithelia  Negative    57. Cockroach, German  Negative    58. Mouse  Negative    59. Tobacco Leaf  Negative       Allergy testing results were read and interpreted by myself, documented by clinical staff.         Salvatore Marvel, MD [facility]

## 2019-10-16 NOTE — Patient Instructions (Addendum)
1. Chronic rhinitis - Testing was negative to the entire panel. - This fits with your history of no improvement off of your allergy medications. - I would recommend nasal saline rinses to see if that helps (or get that "as seen on TV" product!)  2. Coughing - asthma/COPD overlap - Lung testing was slightly low today and it did improve somewhat with the Xopenex treatment. - This points towards a diagnosis of asthma, but it does not confirm it. - We are going to start you on a daily inhaled medication to see if this helps: Symbicort - Symbicort contains a long acting albuterol combined with an inhaled steroid. - If your symptoms are coming from uncontrolled COPD/asthma, this would improve your symptoms.  - Spacer sample and demonstration provided. - Daily controller medication(s): Symbicort 80/4.49mcg two puffs twice daily with spacer - Prior to physical activity: albuterol 2 puffs 10-15 minutes before physical activity. - Rescue medications: albuterol 4 puffs every 4-6 hours as needed - Asthma control goals:  * Full participation in all desired activities (may need albuterol before activity) * Albuterol use two time or less a week on average (not counting use with activity) * Cough interfering with sleep two time or less a month * Oral steroids no more than once a year * No hospitalizations  3. Food intolerance - Continue to avoid cow's milk. - Symptoms are more consistent with a lactose intolerance.   4. Return in about 2 months (around 12/16/2019). This can be an in-person, a virtual Webex or a telephone follow up visit.   Please inform us of any Emergency Department visits, hospitalizations, or changes in symptoms. Call us before going to the ED for breathing or allergy symptoms since we might be able to fit you in for a sick visit. Feel free to contact us anytime with any questions, problems, or concerns.  It was a pleasure to meet you today!  Websites that have reliable patient  information: 1. American Academy of Asthma, Allergy, and Immunology: www.aaaai.org 2. Food Allergy Research and Education (FARE): foodallergy.org 3. Mothers of Asthmatics: http://www.asthmacommunitynetwork.org 4. American College of Allergy, Asthma, and Immunology: www.acaai.org  "Like" [place] on Facebook and Instagram for our latest updates!      Make sure you are registered to vote! If you have moved or changed any of your contact information, you will need to get this updated before voting!  In some cases, you MAY be able to register to vote online: [email or web address]    Voter ID laws are NOT going into effect for the General Election in November 2020! DO NOT let this stop you from exercising your right to vote!   Absentee voting is the SAFEST way to vote during the coronavirus pandemic!   Download and print an absentee ballot request form at [email or web address] or you can scan the QR code below with your smart phone:      More information on absentee ballots can be found here: [email or web address]  [facility] VOTING SITES have been established! You can register to vote and cast your vote on the same day at these locations. See this site for more information on what you need to register to vote: [email or web address]

## 2019-10-18 DIAGNOSIS — I10 Essential (primary) hypertension: Secondary | ICD-10-CM | POA: Diagnosis not present

## 2019-10-18 DIAGNOSIS — Z79899 Other long term (current) drug therapy: Secondary | ICD-10-CM | POA: Diagnosis not present

## 2019-10-18 DIAGNOSIS — R05 Cough: Secondary | ICD-10-CM | POA: Diagnosis not present

## 2019-10-19 DIAGNOSIS — E559 Vitamin D deficiency, unspecified: Secondary | ICD-10-CM | POA: Diagnosis not present

## 2019-10-19 DIAGNOSIS — Z13220 Encounter for screening for lipoid disorders: Secondary | ICD-10-CM | POA: Diagnosis not present

## 2019-10-19 DIAGNOSIS — R030 Elevated blood-pressure reading, without diagnosis of hypertension: Secondary | ICD-10-CM | POA: Diagnosis not present

## 2019-11-01 DIAGNOSIS — I10 Essential (primary) hypertension: Secondary | ICD-10-CM | POA: Diagnosis not present

## 2019-11-21 DIAGNOSIS — I10 Essential (primary) hypertension: Secondary | ICD-10-CM | POA: Diagnosis not present

## 2020-01-03 ENCOUNTER — Ambulatory Visit: Payer: BC Managed Care – PPO | Admitting: Allergy & Immunology

## 2020-01-03 DIAGNOSIS — E871 Hypo-osmolality and hyponatremia: Secondary | ICD-10-CM | POA: Diagnosis not present

## 2020-04-22 NOTE — Progress Notes (Signed)
59 y.o. G0P0000 Divorced White or Caucasian female here for annual exam.  Doing well.  Denies vaginal bleeding.  Really pleased with hip replacement done 12/19 with Dr. Ninfa Linden.    Did have Covid vaccination.    Patient's last menstrual period was 08/27/2008 (approximate).          Sexually active: No.  The current method of family planning is post menopausal status.    Exercising: No.  exercise Smoker:  no  Health Maintenance: Pap:  12-21-18 neg HPV HR neg, 11-10-17 neg HPV HR neg, 11-04-16 neg HPV HR+ 16 18/45 neg History of abnormal Pap:  yes MMG:  12-17-16 bilateral mmg. Left breast u/s 12-30-16 category c density birads 2:neg.  Pt aware this is very overdue.   Colonoscopy:  Declines having this done.  States she will have this done at age 49.  Aware of current guidelines.   BMD:   none TDaP:  2017 Pneumonia vaccine(s):  2015 Shingrix:   Discussed with pt today Hep C testing: neg per patient Screening Labs: neg 2017   reports that she has been smoking cigarettes. She has a 20.00 pack-year smoking history. She has never used smokeless tobacco. She reports current alcohol use. She reports that she does not use drugs.  Past Medical History:  Diagnosis Date  . Arthritis    osteoarthritis- hip  . Endometriosis   . Heart murmur    as a child, reports as benign     Past Surgical History:  Procedure Laterality Date  . DIAGNOSTIC LAPAROSCOPY     endometriosis- 20 yrs ago  . TOTAL HIP ARTHROPLASTY Right 06/04/2016   Procedure: RIGHT TOTAL HIP ARTHROPLASTY ANTERIOR APPROACH;  Surgeon: Mcarthur Rossetti, MD;  Location: [facility];  Service: Orthopedics;  Laterality: Right;  . TOTAL HIP ARTHROPLASTY Left 12/08/2018   Procedure: LEFT TOTAL HIP ARTHROPLASTY ANTERIOR APPROACH;  Surgeon: Mcarthur Rossetti, MD;  Location: [facility];  Service: Orthopedics;  Laterality: Left;    Current Outpatient Medications  Medication Sig Dispense Refill  . Biotin w/ Vitamins C & E (HAIR/SKIN/NAILS  PO) Take 1 tablet by mouth daily.    Marland Kitchen BLACK COHOSH PO Take 1 capsule by mouth at bedtime.    . diphenhydrAMINE (BENADRYL) 25 mg capsule Take 25 mg by mouth at bedtime.    . Glucosamine HCl-MSM (GLUCOSAMINE-MSM PO) Take 2 tablets by mouth daily.    Marland Kitchen ibuprofen (ADVIL,MOTRIN) 200 MG tablet Take 200 mg by mouth every 6 (six) hours as needed for headache or moderate pain.     Marland Kitchen losartan (COZAAR) 100 MG tablet Take 100 mg by mouth daily.    . milk thistle 175 MG tablet Take 175 mg by mouth 2 (two) times daily.     Marland Kitchen tetrahydrozoline (VISINE) 0.05 % ophthalmic solution Place 2 drops into both eyes daily.     No current facility-administered medications for this visit.    Family History  Problem Relation Age of Onset  . Cancer Father        Lung cancer- passed 2005   . Hypertension Brother     Review of Systems  Constitutional: Negative.   HENT: Negative.   Eyes: Negative.   Respiratory: Negative.   Cardiovascular: Negative.   Gastrointestinal: Negative.   Endocrine: Negative.   Genitourinary: Negative.   Musculoskeletal: Negative.   Skin: Negative.   Allergic/Immunologic: Negative.   Neurological: Negative.   Psychiatric/Behavioral: Negative.     Exam:   BP 120/76   Pulse 70  Temp (!) 97 F (36.1 C) (Skin)   Resp 16   Ht 5' 5.5" (1.664 m)   Wt 168 lb (76.2 kg)   LMP 08/27/2008 (Approximate)   BMI 27.53 kg/m   Height: 5' 5.5" (166.4 cm)  General appearance: alert, cooperative and appears stated age Head: Normocephalic, without obvious abnormality, atraumatic Neck: no adenopathy, supple, symmetrical, trachea midline and thyroid normal to inspection and palpation Lungs: clear to auscultation bilaterally Breasts: normal appearance, no masses or tenderness Heart: regular rate and rhythm Abdomen: soft, non-tender; bowel sounds normal; no masses,  no organomegaly Extremities: extremities normal, atraumatic, no cyanosis or edema Skin: Skin color, texture, turgor normal. No  rashes or lesions Lymph nodes: Cervical, supraclavicular, and axillary nodes normal. No abnormal inguinal nodes palpated Neurologic: Grossly normal   Pelvic: External genitalia:  no lesions              Urethra:  normal appearing urethra with no masses, tenderness or lesions              Bartholins and Skenes: normal                 Vagina: normal appearing vagina with normal color and discharge, no lesions              Cervix: no lesions              Pap taken: No. Bimanual Exam:  Uterus:  normal size, contour, position, consistency, mobility, non-tender              Adnexa: normal adnexa and no mass, fullness, tenderness               Rectovaginal: Confirms               Anus:  normal sphincter tone, no lesions  Chaperone, Terence Lux, CMA, was present for exam.  A:  Well Woman with normal exam PMP, no HRT H/o endometriosis with laparoscopy H/o +HR HPV 2017 with neg pap and neg HR HPV 2018 and 2020.  No pap smear indicated today  P:   Mammogram guidelines reviewed.  Aware this is due.   pap smear not indicated today Colon cancer screening discussed.  Declines colonoscopy, IFOB, and cologuard testing/screening.   Shingrix vaccination discussed. Lab work is done with Dr. Addison Lank Return annually or prn

## 2020-04-24 ENCOUNTER — Other Ambulatory Visit: Payer: Self-pay

## 2020-04-25 ENCOUNTER — Encounter: Payer: Self-pay | Admitting: Obstetrics & Gynecology

## 2020-04-25 ENCOUNTER — Ambulatory Visit: Payer: BC Managed Care – PPO | Admitting: Obstetrics & Gynecology

## 2020-04-25 ENCOUNTER — Other Ambulatory Visit: Payer: Self-pay | Admitting: Obstetrics & Gynecology

## 2020-04-25 ENCOUNTER — Other Ambulatory Visit: Payer: Self-pay

## 2020-04-25 VITALS — BP 120/76 | HR 70 | Temp 97.0°F | Resp 16 | Ht 65.5 in | Wt 168.0 lb

## 2020-04-25 DIAGNOSIS — Z1231 Encounter for screening mammogram for malignant neoplasm of breast: Secondary | ICD-10-CM

## 2020-04-25 DIAGNOSIS — Z01419 Encounter for gynecological examination (general) (routine) without abnormal findings: Secondary | ICD-10-CM | POA: Diagnosis not present

## 2020-04-29 ENCOUNTER — Ambulatory Visit
Admission: RE | Admit: 2020-04-29 | Discharge: 2020-04-29 | Disposition: A | Discharge status: Home / Self Care | Payer: BC Managed Care – PPO | Source: Ambulatory Visit | Attending: Obstetrics & Gynecology | Admitting: Obstetrics & Gynecology

## 2020-04-29 ENCOUNTER — Other Ambulatory Visit: Payer: Self-pay

## 2020-04-29 DIAGNOSIS — Z1231 Encounter for screening mammogram for malignant neoplasm of breast: Secondary | ICD-10-CM | POA: Diagnosis not present

## 2020-07-31 DIAGNOSIS — I10 Essential (primary) hypertension: Secondary | ICD-10-CM | POA: Diagnosis not present

## 2020-07-31 DIAGNOSIS — F17200 Nicotine dependence, unspecified, uncomplicated: Secondary | ICD-10-CM | POA: Diagnosis not present

## 2020-07-31 DIAGNOSIS — E559 Vitamin D deficiency, unspecified: Secondary | ICD-10-CM | POA: Diagnosis not present

## 2020-08-11 DIAGNOSIS — J329 Chronic sinusitis, unspecified: Secondary | ICD-10-CM | POA: Diagnosis not present

## 2020-08-11 DIAGNOSIS — B9689 Other specified bacterial agents as the cause of diseases classified elsewhere: Secondary | ICD-10-CM | POA: Diagnosis not present

## 2021-02-05 DIAGNOSIS — I10 Essential (primary) hypertension: Secondary | ICD-10-CM | POA: Diagnosis not present

## 2021-02-05 DIAGNOSIS — E673 Hypervitaminosis D: Secondary | ICD-10-CM | POA: Diagnosis not present

## 2021-02-05 DIAGNOSIS — Z13220 Encounter for screening for lipoid disorders: Secondary | ICD-10-CM | POA: Diagnosis not present

## 2021-02-12 DIAGNOSIS — I10 Essential (primary) hypertension: Secondary | ICD-10-CM | POA: Diagnosis not present

## 2021-02-12 DIAGNOSIS — R0981 Nasal congestion: Secondary | ICD-10-CM | POA: Diagnosis not present

## 2021-02-12 DIAGNOSIS — F17200 Nicotine dependence, unspecified, uncomplicated: Secondary | ICD-10-CM | POA: Diagnosis not present

## 2021-02-12 DIAGNOSIS — Z122 Encounter for screening for malignant neoplasm of respiratory organs: Secondary | ICD-10-CM | POA: Diagnosis not present

## 2021-07-10 ENCOUNTER — Ambulatory Visit: Payer: BC Managed Care – PPO

## 2022-02-16 ENCOUNTER — Other Ambulatory Visit: Payer: Self-pay | Admitting: Family Medicine

## 2022-02-16 DIAGNOSIS — N6321 Unspecified lump in the left breast, upper outer quadrant: Secondary | ICD-10-CM

## 2022-03-03 ENCOUNTER — Other Ambulatory Visit: Payer: Self-pay

## 2022-03-03 ENCOUNTER — Ambulatory Visit
Admission: RE | Admit: 2022-03-03 | Discharge: 2022-03-03 | Disposition: A | Discharge status: Home / Self Care | Payer: 59 | Source: Ambulatory Visit | Attending: Family Medicine | Admitting: Family Medicine

## 2022-03-03 DIAGNOSIS — N6321 Unspecified lump in the left breast, upper outer quadrant: Secondary | ICD-10-CM | POA: Insufficient documentation

## 2022-03-11 ENCOUNTER — Other Ambulatory Visit: Payer: Self-pay | Admitting: Family Medicine

## 2022-03-22 ENCOUNTER — Other Ambulatory Visit: Payer: Self-pay

## 2022-03-22 ENCOUNTER — Ambulatory Visit
Admission: RE | Admit: 2022-03-22 | Discharge: 2022-03-22 | Disposition: A | Discharge status: Home / Self Care | Payer: 59 | Source: Ambulatory Visit | Attending: Family Medicine | Admitting: Family Medicine

## 2022-03-22 DIAGNOSIS — D3A8 Other benign neuroendocrine tumors: Secondary | ICD-10-CM | POA: Diagnosis not present

## 2022-03-22 DIAGNOSIS — R928 Other abnormal and inconclusive findings on diagnostic imaging of breast: Secondary | ICD-10-CM | POA: Insufficient documentation

## 2022-03-22 DIAGNOSIS — N63 Unspecified lump in unspecified breast: Secondary | ICD-10-CM | POA: Diagnosis present

## 2022-03-22 HISTORY — PX: BREAST BIOPSY: SHX20

## 2022-03-24 LAB — SURGICAL PATHOLOGY

## 2022-03-30 NOTE — Progress Notes (Signed)
[place] ?CONSULT NOTE ? ?Patient Care Team: ?Cari Caraway, MD as PCP - General (Family Medicine) ?Valentina Shaggy, MD as Consulting Physician (Allergy and Immunology) ? ?CHIEF COMPLAINTS/PURPOSE OF CONSULTATION:  ?Newly diagnosed left breast cancer ? ? ?HISTORY OF PRESENTING ILLNESS:  ?Angela Lin 61 y.o. female is here because of recent diagnosis of left breast invasive malignancy. She presents to the clinic today for a consult.  She felt a lump in the breast while she was taking a shower.  This led to a mammogram and ultrasound that led to a biopsy on 03/22/2022.  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  She has an appointment to see surgery coming up.  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 ? ?I reviewed her records extensively and collaborated the history with the patient. ? ?SUMMARY OF ONCOLOGIC HISTORY: ?Oncology History  ?Other malignant neuroendocrine tumors ([place])  ?03/31/2022 Initial Diagnosis  ? 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  ? ? ? ?MEDICAL HISTORY:  ?Past Medical History:  ?Diagnosis Date  ? Arthritis   ? osteoarthritis- hip  ? Endometriosis   ? Heart murmur   ? as a child, reports as benign   ? ? ?SURGICAL HISTORY: ?Past Surgical History:  ?Procedure Laterality Date  ? BREAST BIOPSY Left 03/22/2022  ? lt 12:00 venus clip path pending  ? DIAGNOSTIC LAPAROSCOPY    ? endometriosis- 20 yrs ago  ? TOTAL HIP ARTHROPLASTY Right 06/04/2016  ? Procedure: RIGHT TOTAL HIP ARTHROPLASTY ANTERIOR APPROACH;  Surgeon: Mcarthur Rossetti, MD;  Location: [facility];  Service: Orthopedics;  Laterality: Right;  ? TOTAL HIP ARTHROPLASTY Left  12/08/2018  ? Procedure: LEFT TOTAL HIP ARTHROPLASTY ANTERIOR APPROACH;  Surgeon: Mcarthur Rossetti, MD;  Location: [facility];  Service: Orthopedics;  Laterality: Left;  ? ? ?SOCIAL HISTORY: ?Social History  ? ?Socioeconomic History  ? Marital status: Divorced  ?  Spouse name: Not on file  ? Number of children: Not on file  ? Years of education: Not on file  ? Highest education level: Not on file  ?Occupational History  ? Not on file  ?Tobacco Use  ? Smoking status: Every Day  ?  Packs/day: 1.00  ?  Years: 20.00  ?  Pack years: 20.00  ?  Types: Cigarettes  ? Smokeless tobacco: Never  ? Tobacco comments:  ?  past was able to quit- now smoking 0.5 ppd  ?Vaping Use  ? Vaping Use: Some days  ?Substance and Sexual Activity  ? Alcohol use: Yes  ?  Comment: 2-3 times weekly  ? Drug use: No  ? Sexual activity: Not Currently  ?  Birth control/protection: Post-menopausal  ?Other Topics Concern  ? Not on file  ?Social History Narrative  ? Not on file  ? ?Social Determinants of Health  ? ?Financial Resource Strain: Not on file  ?Food Insecurity: Not on file  ?Transportation Needs: Not on file  ?Physical Activity: Not on file  ?Stress: Not on file  ?Social Connections: Not on file  ?Intimate Partner Violence: Not on file  ? ? ?FAMILY HISTORY: ?Family History  ?Problem Relation Age of Onset  ? Cancer Father   ?  Lung cancer- passed 2005   ? Hypertension Brother   ? ? ?ALLERGIES:  has No Known Allergies. ? ?MEDICATIONS:  ?Current Outpatient Medications  ?Medication Sig Dispense Refill  ? Biotin w/ Vitamins C & E (HAIR/SKIN/NAILS PO) Take 1 tablet by mouth daily.    ? BLACK COHOSH PO Take 1 capsule by mouth at bedtime.    ? diphenhydrAMINE (BENADRYL) 25 mg capsule Take 25 mg by mouth at bedtime.    ? Glucosamine HCl-MSM (GLUCOSAMINE-MSM PO) Take 2 tablets by mouth daily.    ? ibuprofen (ADVIL,MOTRIN) 200 MG tablet Take 200 mg by mouth every 6 (six) hours as needed for headache or moderate pain.     ? losartan (COZAAR) 100 MG  tablet Take 100 mg by mouth daily.    ? milk thistle 175 MG tablet Take 175 mg by mouth 2 (two) times daily.     ? tetrahydrozoline (VISINE) 0.05 % ophthalmic solution Place 2 drops into both eyes daily.    ? ?No current facility-administered medications for this visit.  ? ? ?REVIEW OF SYSTEMS:   ?  ?All other systems were reviewed with the patient and are negative. ? ?PHYSICAL EXAMINATION: ?ECOG PERFORMANCE STATUS: 1 - Symptomatic but completely ambulatory ? ?Vitals:  ? 03/31/22 1512  ?BP: (!) 155/74  ?Pulse: 72  ?Resp: 18  ?Temp: (!) 97.5 ?F (36.4 ?C)  ?SpO2: 100%  ? ?Filed Weights  ? 03/31/22 1512  ?Weight: 167 lb 11.2 oz (76.1 kg)  ? ?  ? ?LABORATORY DATA:  ?I have reviewed the data as listed ?Lab Results  ?Component Value Date  ? WBC 10.7 (H) 12/09/2018  ? HGB 13.1 12/09/2018  ? HCT 38.8 12/09/2018  ? MCV 92.4 12/09/2018  ? PLT 218 12/09/2018  ? ?Lab Results  ?Component Value Date  ? NA 133 (L) 12/09/2018  ? K 3.8 12/09/2018  ? CL 100 12/09/2018  ? CO2 25 12/09/2018  ? ? ?RADIOGRAPHIC STUDIES: ?I have personally reviewed the radiological reports and agreed with the findings in the report. ? 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 ?Neuroendocrine tumors of the breast are extremely rare entities.  I explained to her the biologic basis of neuroendocrine tumors.  They are usually differentiation of the stem cell which instead of developing into epithelial tissue, developed into a neuroendocrine tissue.   ?Based on currently available research the approach to treatment is very similar to that of breast cancers.  Generally they are considered to be chemo resistant and did not benefit from systemic chemotherapy.  It is unclear if Oncotype DX would identify high risk  patients and therefore we decided that we would not do Oncotype DX testing. ? ?Pathology and radiology counseling: Discussed with the patient, the details of pathology including the type of breast cancer,the clinical staging, the significance of ER, PR and HER-2/neu receptors and the implications for treatment. After reviewing the pathology in detail, we proceeded to discuss the different treatment options between surgery, radiation, chemotherapy, antiestrogen therapies. ? ?Treatment plan: ?1.  Breast conserving surgery ?2. adjuvant radiation ?3.  Adjuvant antiestrogen therapy ? ?Return to clinic after surgery is complete ? ? ?All questions were answered. The patient knows to call the clinic with any problems, questions or concerns. ?  ? Harriette Ohara, MD ?03/31/22 ?  I Gardiner Coins am scribing for Dr. Lindi Adie ? ?I have reviewed the above documentation for accuracy and completeness, and I agree with the above. ?  ?

## 2022-03-31 ENCOUNTER — Inpatient Hospital Stay: Payer: 59 | Attending: Hematology and Oncology | Admitting: Hematology and Oncology

## 2022-03-31 ENCOUNTER — Inpatient Hospital Stay: Payer: 59

## 2022-03-31 ENCOUNTER — Other Ambulatory Visit: Payer: Self-pay

## 2022-03-31 DIAGNOSIS — Z170 Estrogen receptor positive status [ER+]: Secondary | ICD-10-CM | POA: Insufficient documentation

## 2022-03-31 DIAGNOSIS — Z79899 Other long term (current) drug therapy: Secondary | ICD-10-CM | POA: Diagnosis not present

## 2022-03-31 DIAGNOSIS — Z801 Family history of malignant neoplasm of trachea, bronchus and lung: Secondary | ICD-10-CM | POA: Diagnosis not present

## 2022-03-31 DIAGNOSIS — F17210 Nicotine dependence, cigarettes, uncomplicated: Secondary | ICD-10-CM | POA: Diagnosis not present

## 2022-03-31 DIAGNOSIS — C7A8 Other malignant neuroendocrine tumors: Secondary | ICD-10-CM | POA: Insufficient documentation

## 2022-03-31 NOTE — Assessment & Plan Note (Addendum)
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 ?Pathology and radiology counseling: Discussed with the patient, the details of pathology including the type of breast cancer,the clinical staging, the significance of ER, PR and HER-2/neu receptors and the implications for treatment. After reviewing the pathology in detail, we proceeded to discuss the different treatment options between surgery, radiation, chemotherapy, antiestrogen therapies. ? ?Treatment plan: ?1.  Breast conserving surgery ?2. adjuvant radiation ?3.  Adjuvant antiestrogen therapy ? ?Return to clinic after surgery is complete ?

## 2022-04-13 ENCOUNTER — Ambulatory Visit: Payer: Self-pay | Admitting: General Surgery

## 2022-04-14 ENCOUNTER — Telehealth: Payer: Self-pay | Admitting: Radiation Oncology

## 2022-04-14 ENCOUNTER — Encounter: Payer: Self-pay | Admitting: *Deleted

## 2022-04-14 DIAGNOSIS — C7A8 Other malignant neuroendocrine tumors: Secondary | ICD-10-CM

## 2022-04-14 NOTE — Progress Notes (Signed)
New Breast Cancer Diagnosis: Left Breast ? ?Did patient present with symptoms (if so, please note symptoms) or screening mammography?:Palpable mass   ? ?Location and Extent of disease :left breast. Located at 12 o'clock position, measured 2.6 cm in greatest dimension on ultrasound. Adenopathy no. ? ?Histology per Pathology Report: grade 2, Neuroendocrine tumor with DCIS 03/22/2022 ? ?Receptor Status: ER(positive), PR (positive), Her2-neu (negative), Ki-(8%) ? ?Surgeon and surgical plan, if any: ?Dr. Toth ?Left Breast Lumpectomy 05/26/2022 ? ?Medical oncologist, treatment if any:   ?Dr. Gudena 03/31/2022 ?Treatment plan: ?1.  Breast conserving surgery ?2. adjuvant radiation ?3.  Adjuvant antiestrogen therapy ?  ? ? ?Family History of Breast/Ovarian/Prostate Cancer: Grandmother had Ovarian ? ?Lymphedema issues, if any:  No   ? ?Pain issues, if any: No   ? ?SAFETY ISSUES: ?Prior radiation? No ?Pacemaker/ICD? No ?Possible current pregnancy? Postmenopausal ?Is the patient on methotrexate? No ? ?Current Complaints / other details:   ? ?

## 2022-04-14 NOTE — Telephone Encounter (Signed)
Called patient to schedule a consultation w. Dr. Lisbeth Renshaw. Patient stated she was busy at the moment, and she would give a call back.  ?

## 2022-04-15 ENCOUNTER — Ambulatory Visit
Admission: RE | Admit: 2022-04-15 | Discharge: 2022-04-15 | Disposition: A | Discharge status: Home / Self Care | Payer: 59 | Source: Ambulatory Visit | Attending: Radiation Oncology | Admitting: Radiation Oncology

## 2022-04-15 ENCOUNTER — Encounter: Payer: Self-pay | Admitting: Radiation Oncology

## 2022-04-15 DIAGNOSIS — C50912 Malignant neoplasm of unspecified site of left female breast: Secondary | ICD-10-CM

## 2022-04-19 NOTE — Progress Notes (Signed)
?Radiation Oncology         ([phone]) [phone] ?________________________________ ? ?Name: Angela Lin MRN: [id number]  ?Date: 04/15/2022  DOB: 1961-09-27 ? ?[id number], Abigail Butts, MD  Nicholas Lose, MD    ? ?REFERRING PHYSICIAN: Nicholas Lose, MD ? ? ?DIAGNOSIS: The encounter diagnosis was Malignant neoplasm of left breast in female, estrogen receptor positive, unspecified site of breast ([place]). ? ? ?HISTORY OF PRESENT ILLNESS::Angela Lin is a 61 y.o. female who is seen for an initial consultation visit regarding the patient's diagnosis of left-sided breast cancer.  The patient felt a lump within the left breast at home which led to further work-up.  A suspicious finding was noted on mammogram and on ultrasound, a 2.3 cm tumor was seen within the upper outer quadrant.  A couple of lymph nodes were also seen, with 1 potentially being suspicious.  Biopsy has been performed of the breast tumor thus far.  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  The tumor was estrogen receptor positive and progesterone receptor positive.  The HER2/neu was negative.  The Ki-67 staining was 8%.  The patient is being scheduled to see surgery and the patient is interested in breast conservation treatment at this time.  I have been asked to see the patient today for consideration of adjuvant radiation treatment subsequently.  The patient has seen medical oncology as well. ? ? ? ?PREVIOUS RADIATION THERAPY: No ? ? ?PAST MEDICAL HISTORY:  has a past medical history of Arthritis, Endometriosis, and Heart murmur.   ? ? ?PAST SURGICAL HISTORY: ?Past Surgical History:  ?Procedure Laterality Date  ? BREAST BIOPSY Left 03/22/2022  ? lt 12:00 venus clip path pending  ? DIAGNOSTIC LAPAROSCOPY    ? endometriosis- 20 yrs ago  ? TOTAL HIP ARTHROPLASTY Right 06/04/2016  ? Procedure: RIGHT TOTAL HIP ARTHROPLASTY ANTERIOR APPROACH;  Surgeon: Mcarthur Rossetti, MD;  Location: [facility];  Service: Orthopedics;   Laterality: Right;  ? TOTAL HIP ARTHROPLASTY Left 12/08/2018  ? Procedure: LEFT TOTAL HIP ARTHROPLASTY ANTERIOR APPROACH;  Surgeon: Mcarthur Rossetti, MD;  Location: [facility];  Service: Orthopedics;  Laterality: Left;  ? ? ? ?FAMILY HISTORY: family history includes Cancer in her father; Hypertension in her brother. ? ? ?SOCIAL HISTORY:  reports that she has been smoking cigarettes. She has a 20.00 pack-year smoking history. She has never used smokeless tobacco. She reports current alcohol use. She reports that she does not use drugs. ? ? ?ALLERGIES: Patient has no known allergies. ? ? ?MEDICATIONS:  ?Current Outpatient Medications  ?Medication Sig Dispense Refill  ? aspirin 81 MG chewable tablet Chew 81 mg by mouth daily.    ? Biotin w/ Vitamins C & E (HAIR/SKIN/NAILS PO) Take 1 tablet by mouth daily.    ? diphenhydrAMINE (BENADRYL) 25 mg capsule Take 25 mg by mouth at bedtime.    ? Glucosamine HCl-MSM (GLUCOSAMINE-MSM PO) Take 2 tablets by mouth daily.    ? ibuprofen (ADVIL,MOTRIN) 200 MG tablet Take 200 mg by mouth every 6 (six) hours as needed for headache or moderate pain.     ? losartan (COZAAR) 100 MG tablet Take 100 mg by mouth daily.    ? milk thistle 175 MG tablet Take 175 mg by mouth 2 (two) times daily.     ? tetrahydrozoline 0.05 % ophthalmic solution Place 2 drops into both eyes daily.    ? BLACK COHOSH PO Take 1 capsule by mouth at bedtime. (Patient not taking: Reported on 04/15/2022)    ? ?  No current facility-administered medications for this encounter.  ? ? ? ?REVIEW OF SYSTEMS:  A 15 point review of systems is documented in the electronic medical record. This was obtained by the nursing staff. However, I reviewed this with the patient to discuss relevant findings and make appropriate changes.  Pertinent items are noted in HPI. ?  ? ?PHYSICAL EXAM:  vitals were not taken for this visit.   ?ECOG = 1  ? ?0 - Asymptomatic (Fully active, able to carry on all predisease activities without  restriction) ? ?1 - Symptomatic but completely ambulatory (Restricted in physically strenuous activity but ambulatory and able to carry out work of a light or sedentary nature. For example, light housework, office work) ? ?2 - Symptomatic, <50% in bed during the day (Ambulatory and capable of all self care but unable to carry out any work activities. Up and about more than 50% of waking hours) ? ?3 - Symptomatic, >50% in bed, but not bedbound (Capable of only limited self-care, confined to bed or chair 50% or more of waking hours) ? ?4 - Bedbound (Completely disabled. Cannot carry on any self-care. Totally confined to bed or chair) ? ?5 - Death ? ? Oken MM, Creech RH, Tormey DC, et al. [phone]). "Toxicity and response criteria of the [facility]". [place] Oncol. 5 (6): 649-55 ? ?Alert, no acute distress ? ? ?LABORATORY DATA:  ?Lab Results  ?Component Value Date  ? WBC 10.7 (H) 12/09/2018  ? HGB 13.1 12/09/2018  ? HCT 38.8 12/09/2018  ? MCV 92.4 12/09/2018  ? PLT 218 12/09/2018  ? ?Lab Results  ?Component Value Date  ? NA 133 (L) 12/09/2018  ? K 3.8 12/09/2018  ? CL 100 12/09/2018  ? CO2 25 12/09/2018  ? ?No results found for: ALT, AST, GGT, ALKPHOS, BILITOT ?  ? ?RADIOGRAPHY: MM CLIP PLACEMENT LEFT ? ?Result Date: 03/22/2022 ?CLINICAL DATA:  Status post ultrasound-guided biopsy EXAM: 3D DIAGNOSTIC LEFT MAMMOGRAM POST ULTRASOUND BIOPSY COMPARISON:  Previous exam(s). FINDINGS: 3D Mammographic images were obtained following ultrasound guided biopsy of a mass at 12 o'clock. The VENUS biopsy marking clip is in expected position at the site of biopsy. IMPRESSION: Appropriate positioning of the VENUS shaped biopsy marking clip at the site of biopsy in the upper breast. Final Assessment: Post Procedure Mammograms for Marker Placement Electronically Signed   By: Valentino Saxon M.D.   On: 03/22/2022 09:46 ? ?[place] LT BREAST BX W LOC DEV 1ST LESION IMG BX [place] US GUIDE ? 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 (WHO CLASSIFICATION OF TUMORS, FIFTH EDITION: BREAST TUMORS; 2019). This was found to be concordant by Dr. Valentino Saxon. Pathology results were discussed with the patient by telephone. The patient reported doing well after the biopsy with tenderness at the site. Post biopsy instructions and care were reviewed and questions were answered. The patient was encouraged to call [facility] for any additional concerns. Recommend: 1- Surgical and medical oncology referral (arranged by Al Pimple RN Oncology Nurse Navigator of [place]). 2- Clinical management of the left axilla due to lack of focally enlarged left axillary lymph nodes identified at time of biopsy. 3- Consider breast MRI with and without contrast given breast density. Pathology results reported by Stacie Acres RN on 03/25/2022. Electronically Signed   By: Valentino Saxon M.D.  On: 03/25/2022 10:50  ? ?Result Date: 03/25/2022 ?CLINICAL DATA:  Palpable LEFT breast mass EXAM: ULTRASOUND GUIDED LEFT BREAST CORE NEEDLE BIOPSY COMPARISON:  Previous exam(s). PROCEDURE: I met with the patient and we discussed the procedure of ultrasound-guided biopsy, including benefits and alternatives. We discussed the high likelihood of a successful procedure. We discussed the risks of the procedure, including infection, bleeding, tissue injury, clip migration, and inadequate sampling. Informed written consent was given. The usual time-out protocol was performed immediately prior to the procedure. Lesion quadrant: Upper outer quadrant Targeted ultrasound performed of the LEFT axilla. Previously described LEFT axillary lymph node with focal cortical thickening of 4 mm was not identified. All visualized axillary lymph nodes demonstrated normal echogenic hila and smooth  cortices of approximately 2-3 mm. As such, LEFT axillary ultrasound-guided biopsy was deferred at this point in time. Using sterile technique and 1% lidocaine and 1% lidocaine with epinephrine as local anesthetic, under direct

## 2022-04-20 NOTE — Therapy (Signed)
?OUTPATIENT PHYSICAL THERAPY BREAST CANCER BASELINE EVALUATION ? ? ?Patient Name: Angela Lin ?MRN: [id number] ?DOB:[birth year], 61 y.o., female ?Today's Date: 04/21/2022 ? ? PT End of Session - 04/21/22 0846   ? ? Visit Number 1   ? Number of Visits 2   ? Date for PT Re-Evaluation 07/14/22   ? PT Start Time [phone]   ? PT Stop Time 0845   ? PT Time Calculation (min) 49 min   ? Activity Tolerance Patient tolerated treatment well   ? Behavior During Therapy [facility] for tasks assessed/performed   ? ?  ?  ? ?  ? ? ?Past Medical History:  ?Diagnosis Date  ? Arthritis   ? osteoarthritis- hip  ? Endometriosis   ? Heart murmur   ? as a child, reports as benign   ? ?Past Surgical History:  ?Procedure Laterality Date  ? BREAST BIOPSY Left 03/22/2022  ? lt 12:00 venus clip path pending  ? DIAGNOSTIC LAPAROSCOPY    ? endometriosis- 20 yrs ago  ? TOTAL HIP ARTHROPLASTY Right 06/04/2016  ? Procedure: RIGHT TOTAL HIP ARTHROPLASTY ANTERIOR APPROACH;  Surgeon: Mcarthur Rossetti, MD;  Location: [facility];  Service: Orthopedics;  Laterality: Right;  ? TOTAL HIP ARTHROPLASTY Left 12/08/2018  ? Procedure: LEFT TOTAL HIP ARTHROPLASTY ANTERIOR APPROACH;  Surgeon: Mcarthur Rossetti, MD;  Location: [facility];  Service: Orthopedics;  Laterality: Left;  ? ?Patient Active Problem List  ? Diagnosis Date Noted  ? Other malignant neuroendocrine tumors ([place]) 03/31/2022  ? Unilateral primary osteoarthritis, left hip 12/08/2018  ? Status post total replacement of left hip 12/08/2018  ? Osteoarthritis of right hip 06/04/2016  ? Avascular necrosis of bones of both hips ([place]) 06/04/2016  ? Status post total replacement of right hip 06/04/2016  ? ? ?PCP: Cari Caraway, MD ? ?REFERRING PROVIDER: Nicholas Lose, MD ? ?REFERRING DIAG: Left Breast Neuroendocrine tumor ? ?THERAPY DIAG:  ?Neuroendocrine carcinoma ([place]) ? ?Abnormal posture ? ?ONSET DATE: 03/22/2022 ? ?SUBJECTIVE                                                                                                                                                                                           ? ?SUBJECTIVE STATEMENT: ?Patient reports she is here today to be seen by her medical team for her newly diagnosed left breast cancer.  ? ?PERTINENT HISTORY:  ?Patient was diagnosed on 03/22/2022 with 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 It measures 2.6 cm and is located in the upper outer quadrant. It is ER+,PR+, HER 2- with a Ki67 of 8%. She is pending a left lumpectomy with SLNB on 05/26/2022 ? ?  PATIENT GOALS   reduce lymphedema risk and learn post op HEP.  ? ?PAIN:  ?Are you having pain? No ? ? ?PRECAUTIONS: Active CA Other: Bilateral THA,smoker ? ?HAND DOMINANCE: right ? ?WEIGHT BEARING RESTRICTIONS No ? ?FALLS:  ?Has patient fallen in last 6 months? No ? ?LIVING ENVIRONMENT: ?Patient lives with: alone ?Lives in: House/apartment ?Has following equipment at home: None ? ?OCCUPATION: Pet sitting, [employer] on Sat, [place] ? ?LEISURE: walking, ? ?PRIOR LEVEL OF FUNCTION: Independent ? ? ?OBJECTIVE ? ?COGNITION: ? Overall cognitive status: Within functional limits for tasks assessed   ? ?POSTURE:  ?Forward head and rounded shoulders posture ? ?UPPER EXTREMITY AROM/PROM: ? ?A/PROM RIGHT  04/21/2022 ?  ?Shoulder extension 67  ?Shoulder flexion 160  ?Shoulder abduction 180  ?Shoulder internal rotation 65  ?Shoulder external rotation 100  ?  (Blank rows = not tested) ? ?A/PROM LEFT  04/21/2022  ?Shoulder extension 70  ?Shoulder flexion 163  ?Shoulder abduction 180  ?Shoulder internal rotation 59  ?Shoulder external rotation 96  ?  (Blank rows = not tested) ? ? ?CERVICAL AROM: ?All within functional limits:  ? ? ? ? ?UPPER EXTREMITY STRENGTH: WNL ? ? ?LYMPHEDEMA ASSESSMENTS:  ? ?LANDMARK RIGHT  04/21/2022  ?10 cm proximal to olecranon process 29.4  ?Olecranon process 25.8  ?10 cm proximal to ulnar styloid process 24.1  ?Just proximal to ulnar styloid process 17  ?Across hand at thumb web space 21  ?At base  of 2nd digit 7.2  ?(Blank rows = not tested) ? ?[place] LEFT  04/21/2022  ?10 cm proximal to olecranon process 28.7  ?Olecranon process 25.7  ?10 cm proximal to ulnar styloid process 22.6  ?Just proximal to ulnar styloid process 16.7  ?Across hand at thumb web space 19.9  ?At base of 2nd digit 7.0  ?(Blank rows = not tested) ? ? ?L-DEX LYMPHEDEMA SCREENING: ? ?The patient was assessed using the L-Dex machine today to produce a lymphedema index baseline score. The patient will be reassessed on a regular basis (typically every 3 months) to obtain new L-Dex scores. If the score is > 6.5 points away from his/her baseline score indicating onset of subclinical lymphedema, it will be recommended to wear a compression garment for 4 weeks, 12 hours per day and then be reassessed. If the score continues to be > 6.5 points from baseline at reassessment, we will initiate lymphedema treatment. Assessing in this manner has a 95% rate of preventing clinically significant lymphedema. ? ? L-DEX FLOWSHEETS - 04/21/22 0800   ? ?  ? L-DEX LYMPHEDEMA SCREENING  ? Measurement Type Unilateral   ? L-DEX MEASUREMENT EXTREMITY Upper Extremity   ? POSITION  Standing   ? DOMINANT SIDE Right   ? At Risk Side Left   ? BASELINE SCORE (UNILATERAL) 0.2   ? ?  ?  ? ?  ? ? ? ?QUICK DASH SURVEY: 2.27% ? ? ?PATIENT EDUCATION:  ?Education details: Lymphedema risk reduction and post op shoulder/posture HEP, SOZO screens, compression bra ?Person educated: Patient ?Education method: Explanation, Demonstration, Handout ?Education comprehension: Patient verbalized understanding and returned demonstration ? ? ?HOME EXERCISE PROGRAM: ?Patient was instructed today in a home exercise program today for post op shoulder range of motion. These included active assist shoulder flexion in sitting/supine, scapular retraction, wall walking with shoulder abduction, and hands behind head external rotation sitting and supine.Marland Kitchen  She was encouraged to do these twice a day,  holding 3 seconds and repeating 5 times when permitted by her  physician. ? ? ?ASSESSMENT: ? ?CLINICAL IMPRESSION: ?Her multidisciplinary medical team met prior to her assessments to determine a recommended treatment plan. She is planning to have a Left lumpectomy with SLNB on 05/26/2022. She will benefit from a post op PT reassessment to determine needs and from L-Dex screens every 3 months for 2 years to detect subclinical lymphedema. ? ?Pt will benefit from skilled therapeutic intervention to improve on the following deficits: Decreased knowledge of precautions, impaired UE functional use, pain, decreased ROM, postural dysfunction.  ? ?PT treatment/interventions: ADL/self-care home management, pt/family education, therapeutic exercise ? ?REHAB POTENTIAL: Excellent ? ?CLINICAL DECISION MAKING: Stable/uncomplicated ? ?EVALUATION COMPLEXITY: Low ? ? ?GOALS: ?Goals reviewed with patient? YES ? ?LONG TERM GOALS: (STG=LTG) ? ? Name Target Date Goal status  ?1 Pt will be able to verbalize understanding of pertinent lymphedema risk reduction practices relevant to her dx specifically related to skin care.  ?Baseline:  No knowledge 04/21/2022 Achieved at eval  ?2 Pt will be able to return demo and/or verbalize understanding of the post op HEP related to regaining shoulder ROM. ?Baseline:  No knowledge 04/21/2022 Achieved at eval  ?3 Pt will be able to verbalize understanding of the importance of attending the post op After Breast CA Class for further lymphedema risk reduction education and therapeutic exercise.  ?Baseline:  No knowledge 04/21/2022 Achieved at eval  ?4 Pt will demo she has regained full shoulder ROM and function post operatively compared to baselines.  ?Baseline: See objective measurements taken today. 07/14/2022   ? ? ? ?PLAN: ?PT FREQUENCY/DURATION: EVAL and 1 follow up appointment.  ? ?PLAN FOR NEXT SESSION: will reassess 3-4 weeks post op to determine needs. ?  ?Patient will follow up at outpatient cancer  rehab 3-4 weeks following surgery.  If the patient requires physical therapy at that time, a specific plan will be dictated and sent to the referring physician for approval. ?The patient was educated to

## 2022-04-21 ENCOUNTER — Ambulatory Visit: Payer: 59 | Attending: Hematology and Oncology

## 2022-04-21 DIAGNOSIS — C7A8 Other malignant neuroendocrine tumors: Secondary | ICD-10-CM | POA: Insufficient documentation

## 2022-04-21 DIAGNOSIS — R293 Abnormal posture: Secondary | ICD-10-CM | POA: Diagnosis not present

## 2022-05-05 ENCOUNTER — Encounter: Payer: Self-pay | Admitting: *Deleted

## 2022-05-05 DIAGNOSIS — C7A8 Other malignant neuroendocrine tumors: Secondary | ICD-10-CM

## 2022-05-17 ENCOUNTER — Other Ambulatory Visit: Payer: Self-pay

## 2022-05-17 ENCOUNTER — Encounter (HOSPITAL_BASED_OUTPATIENT_CLINIC_OR_DEPARTMENT_OTHER): Payer: Self-pay | Admitting: General Surgery

## 2022-05-20 ENCOUNTER — Encounter (HOSPITAL_BASED_OUTPATIENT_CLINIC_OR_DEPARTMENT_OTHER)
Admission: RE | Admit: 2022-05-20 | Discharge: 2022-05-20 | Disposition: A | Discharge status: Home / Self Care | Payer: 59 | Source: Ambulatory Visit | Attending: General Surgery | Admitting: General Surgery

## 2022-05-20 DIAGNOSIS — I10 Essential (primary) hypertension: Secondary | ICD-10-CM | POA: Insufficient documentation

## 2022-05-20 DIAGNOSIS — F17210 Nicotine dependence, cigarettes, uncomplicated: Secondary | ICD-10-CM | POA: Diagnosis not present

## 2022-05-20 DIAGNOSIS — Z01810 Encounter for preprocedural cardiovascular examination: Secondary | ICD-10-CM | POA: Insufficient documentation

## 2022-05-20 DIAGNOSIS — D3A8 Other benign neuroendocrine tumors: Secondary | ICD-10-CM | POA: Diagnosis not present

## 2022-05-20 NOTE — Progress Notes (Signed)

## 2022-05-25 NOTE — Anesthesia Preprocedure Evaluation (Signed)
Anesthesia Evaluation  Patient identified by MRN, date of birth, ID band Patient awake    Reviewed: Allergy & Precautions, NPO status , Patient's Chart, lab work & pertinent test results  Airway Mallampati: I  TM Distance: >3 FB Neck ROM: Full    Dental no notable dental hx.    Pulmonary neg pulmonary ROS, Current Smoker and Patient abstained from smoking.,    Pulmonary exam normal breath sounds clear to auscultation       Cardiovascular Exercise Tolerance: Good hypertension, Pt. on medications and Pt. on home beta blockers Normal cardiovascular exam Rhythm:Regular Rate:Normal  Sinus bradycardia Cannot rule out Anterior infarct , age undetermined Abnormal ECG No previous ECGs available Confirmed by Gwyndolyn Kaufman [phone]) on 05/20/2022 3:50:05 PM   Neuro/Psych negative neurological ROS  negative psych ROS   GI/Hepatic negative GI ROS, Neg liver ROS,   Endo/Other  negative endocrine ROS  Renal/GU negative Renal ROS  negative genitourinary   Musculoskeletal  (+) Arthritis ,   Abdominal   Peds negative pediatric ROS (+)  Hematology negative hematology ROS (+)   Anesthesia Other Findings   Reproductive/Obstetrics negative OB ROS                           Anesthesia Physical Anesthesia Plan  ASA: 2  Anesthesia Plan: General   Post-op Pain Management: Regional block* and Tylenol PO (pre-op)*   Induction: Intravenous  PONV Risk Score and Plan: 2 and Treatment may vary due to age or medical condition, Ondansetron and Dexamethasone  Airway Management Planned: LMA  Additional Equipment: None  Intra-op Plan:   Post-operative Plan: Extubation in OR  Informed Consent: I have reviewed the patients History and Physical, chart, labs and discussed the procedure including the risks, benefits and alternatives for the proposed anesthesia with the patient or authorized representative who has  indicated his/her understanding and acceptance.     Dental advisory given  Plan Discussed with: CRNA, Anesthesiologist and Surgeon  Anesthesia Plan Comments:        Anesthesia Quick Evaluation

## 2022-05-26 ENCOUNTER — Ambulatory Visit (HOSPITAL_BASED_OUTPATIENT_CLINIC_OR_DEPARTMENT_OTHER)
Admission: RE | Admit: 2022-05-26 | Discharge: 2022-05-26 | Disposition: A | Discharge status: Home / Self Care | Payer: 59 | Attending: General Surgery | Admitting: General Surgery

## 2022-05-26 ENCOUNTER — Encounter (HOSPITAL_BASED_OUTPATIENT_CLINIC_OR_DEPARTMENT_OTHER)
Admission: RE | Disposition: A | Discharge status: Home / Self Care | Payer: Self-pay | Source: Home / Self Care | Attending: General Surgery

## 2022-05-26 ENCOUNTER — Ambulatory Visit (HOSPITAL_BASED_OUTPATIENT_CLINIC_OR_DEPARTMENT_OTHER): Payer: 59 | Admitting: Anesthesiology

## 2022-05-26 ENCOUNTER — Encounter (HOSPITAL_BASED_OUTPATIENT_CLINIC_OR_DEPARTMENT_OTHER): Payer: Self-pay | Admitting: General Surgery

## 2022-05-26 ENCOUNTER — Other Ambulatory Visit: Payer: Self-pay

## 2022-05-26 DIAGNOSIS — D3A8 Other benign neuroendocrine tumors: Secondary | ICD-10-CM | POA: Diagnosis not present

## 2022-05-26 DIAGNOSIS — I10 Essential (primary) hypertension: Secondary | ICD-10-CM | POA: Insufficient documentation

## 2022-05-26 DIAGNOSIS — F17210 Nicotine dependence, cigarettes, uncomplicated: Secondary | ICD-10-CM | POA: Insufficient documentation

## 2022-05-26 DIAGNOSIS — C50912 Malignant neoplasm of unspecified site of left female breast: Secondary | ICD-10-CM | POA: Diagnosis not present

## 2022-05-26 DIAGNOSIS — Z01818 Encounter for other preprocedural examination: Secondary | ICD-10-CM

## 2022-05-26 HISTORY — PX: BREAST LUMPECTOMY: SHX2

## 2022-05-26 HISTORY — DX: Essential (primary) hypertension: I10

## 2022-05-26 SURGERY — BREAST LUMPECTOMY WITH EXCISION OF SENTINEL NODE
Anesthesia: General | Site: Breast | Laterality: Left

## 2022-05-26 MED ORDER — OXYCODONE HCL 5 MG/5ML PO SOLN: 5.0000 mg | Freq: Once | ORAL | Status: DC | PRN

## 2022-05-26 MED ORDER — ACETAMINOPHEN 500 MG PO TABS
1000.0000 mg | ORAL_TABLET | ORAL | Status: AC
  Administered 2022-05-26: 1000 mg via ORAL

## 2022-05-26 MED ORDER — CELECOXIB 200 MG PO CAPS
ORAL_CAPSULE | ORAL | Status: AC
Start: 2022-05-26 — End: ?
  Filled 2022-05-26: qty 1

## 2022-05-26 MED ORDER — CHLORHEXIDINE GLUCONATE CLOTH 2 % EX PADS
6.0000 | MEDICATED_PAD | Freq: Once | CUTANEOUS | Status: DC
Start: 2022-05-26 — End: 2022-05-26

## 2022-05-26 MED ORDER — GABAPENTIN 300 MG PO CAPS
300.0000 mg | ORAL_CAPSULE | ORAL | Status: AC
  Administered 2022-05-26: 300 mg via ORAL

## 2022-05-26 MED ORDER — 0.9 % SODIUM CHLORIDE (POUR BTL) OPTIME
TOPICAL | Status: DC | PRN
  Administered 2022-05-26: 300 mL

## 2022-05-26 MED ORDER — FENTANYL CITRATE (PF) 100 MCG/2ML IJ SOLN
INTRAMUSCULAR | Status: DC | PRN
  Administered 2022-05-26: 25 ug via INTRAVENOUS

## 2022-05-26 MED ORDER — BUPIVACAINE LIPOSOME 1.3 % IJ SUSP
INTRAMUSCULAR | Status: DC | PRN
  Administered 2022-05-26: 10 mL

## 2022-05-26 MED ORDER — CEFAZOLIN SODIUM-DEXTROSE 2-4 GM/100ML-% IV SOLN
INTRAVENOUS | Status: AC
  Filled 2022-05-26: qty 100

## 2022-05-26 MED ORDER — ONDANSETRON HCL 4 MG/2ML IJ SOLN: 4.0000 mg | Freq: Once | INTRAMUSCULAR | Status: DC | PRN

## 2022-05-26 MED ORDER — CEFAZOLIN SODIUM-DEXTROSE 2-4 GM/100ML-% IV SOLN
2.0000 g | INTRAVENOUS | Status: AC
  Administered 2022-05-26: 2 g via INTRAVENOUS

## 2022-05-26 MED ORDER — MIDAZOLAM HCL 2 MG/2ML IJ SOLN
INTRAMUSCULAR | Status: AC
  Filled 2022-05-26: qty 2

## 2022-05-26 MED ORDER — DEXAMETHASONE SODIUM PHOSPHATE 4 MG/ML IJ SOLN
INTRAMUSCULAR | Status: DC | PRN
  Administered 2022-05-26: 8 mg via INTRAVENOUS

## 2022-05-26 MED ORDER — AMISULPRIDE (ANTIEMETIC) 5 MG/2ML IV SOLN: 10.0000 mg | Freq: Once | INTRAVENOUS | Status: DC | PRN

## 2022-05-26 MED ORDER — PROPOFOL 10 MG/ML IV BOLUS
INTRAVENOUS | Status: AC
  Filled 2022-05-26: qty 20

## 2022-05-26 MED ORDER — DEXAMETHASONE SODIUM PHOSPHATE 10 MG/ML IJ SOLN
INTRAMUSCULAR | Status: AC
  Filled 2022-05-26: qty 1

## 2022-05-26 MED ORDER — MIDAZOLAM HCL 2 MG/2ML IJ SOLN
2.0000 mg | Freq: Once | INTRAMUSCULAR | Status: AC
  Administered 2022-05-26: 1 mg via INTRAVENOUS

## 2022-05-26 MED ORDER — PHENYLEPHRINE HCL (PRESSORS) 10 MG/ML IV SOLN
INTRAVENOUS | Status: DC | PRN
  Administered 2022-05-26: 80 ug via INTRAVENOUS

## 2022-05-26 MED ORDER — METHYLENE BLUE 1 % INJ SOLN
INTRAVENOUS | Status: AC
  Filled 2022-05-26: qty 10

## 2022-05-26 MED ORDER — EPHEDRINE 5 MG/ML INJ
INTRAVENOUS | Status: AC
  Filled 2022-05-26: qty 5

## 2022-05-26 MED ORDER — FENTANYL CITRATE (PF) 100 MCG/2ML IJ SOLN
INTRAMUSCULAR | Status: AC
  Filled 2022-05-26: qty 2

## 2022-05-26 MED ORDER — LIDOCAINE 2% (20 MG/ML) 5 ML SYRINGE
INTRAMUSCULAR | Status: AC
  Filled 2022-05-26: qty 5

## 2022-05-26 MED ORDER — PROPOFOL 10 MG/ML IV BOLUS
INTRAVENOUS | Status: AC
Start: 2022-05-26 — End: ?
  Filled 2022-05-26: qty 20

## 2022-05-26 MED ORDER — ONDANSETRON HCL 4 MG/2ML IJ SOLN
INTRAMUSCULAR | Status: AC
  Filled 2022-05-26: qty 2

## 2022-05-26 MED ORDER — BUPIVACAINE-EPINEPHRINE (PF) 0.25% -1:200000 IJ SOLN
INTRAMUSCULAR | Status: AC
  Filled 2022-05-26: qty 30

## 2022-05-26 MED ORDER — EPHEDRINE SULFATE (PRESSORS) 50 MG/ML IJ SOLN
INTRAMUSCULAR | Status: DC | PRN
  Administered 2022-05-26 (×4): 5 mg via INTRAVENOUS

## 2022-05-26 MED ORDER — LACTATED RINGERS IV SOLN: INTRAVENOUS | Status: DC

## 2022-05-26 MED ORDER — BUPIVACAINE HCL (PF) 0.5 % IJ SOLN
INTRAMUSCULAR | Status: DC | PRN
  Administered 2022-05-26: 20 mL

## 2022-05-26 MED ORDER — BUPIVACAINE-EPINEPHRINE 0.25% -1:200000 IJ SOLN
INTRAMUSCULAR | Status: DC | PRN
  Administered 2022-05-26: 7 mL

## 2022-05-26 MED ORDER — GABAPENTIN 300 MG PO CAPS
ORAL_CAPSULE | ORAL | Status: AC
  Filled 2022-05-26: qty 1

## 2022-05-26 MED ORDER — PHENYLEPHRINE 80 MCG/ML (10ML) SYRINGE FOR IV PUSH (FOR BLOOD PRESSURE SUPPORT)
PREFILLED_SYRINGE | INTRAVENOUS | Status: AC
  Filled 2022-05-26: qty 10

## 2022-05-26 MED ORDER — ACETAMINOPHEN 500 MG PO TABS
ORAL_TABLET | ORAL | Status: AC
  Filled 2022-05-26: qty 2

## 2022-05-26 MED ORDER — OXYCODONE HCL 5 MG PO TABS: 5.0000 mg | ORAL_TABLET | Freq: Once | ORAL | Status: DC | PRN

## 2022-05-26 MED ORDER — CHLORHEXIDINE GLUCONATE CLOTH 2 % EX PADS: 6.0000 | MEDICATED_PAD | Freq: Once | CUTANEOUS | Status: DC

## 2022-05-26 MED ORDER — ONDANSETRON HCL 4 MG/2ML IJ SOLN
INTRAMUSCULAR | Status: DC | PRN
  Administered 2022-05-26: 4 mg via INTRAVENOUS

## 2022-05-26 MED ORDER — FENTANYL CITRATE (PF) 100 MCG/2ML IJ SOLN
100.0000 ug | Freq: Once | INTRAMUSCULAR | Status: AC
  Administered 2022-05-26: 50 ug via INTRAVENOUS

## 2022-05-26 MED ORDER — OXYCODONE HCL 5 MG PO TABS: 5.0000 mg | ORAL_TABLET | Freq: Four times a day (QID) | ORAL | 0 refills | Status: DC | PRN

## 2022-05-26 MED ORDER — PROPOFOL 10 MG/ML IV BOLUS
INTRAVENOUS | Status: DC | PRN
  Administered 2022-05-26: 150 mg via INTRAVENOUS
  Administered 2022-05-26: 20 mg via INTRAVENOUS

## 2022-05-26 MED ORDER — MAGTRACE LYMPHATIC TRACER
INTRAMUSCULAR | Status: DC | PRN
  Administered 2022-05-26: 2 mL via INTRAMUSCULAR

## 2022-05-26 MED ORDER — FENTANYL CITRATE (PF) 100 MCG/2ML IJ SOLN: 25.0000 ug | INTRAMUSCULAR | Status: DC | PRN

## 2022-05-26 MED ORDER — LIDOCAINE 2% (20 MG/ML) 5 ML SYRINGE
INTRAMUSCULAR | Status: DC | PRN
  Administered 2022-05-26: 60 mg via INTRAVENOUS

## 2022-05-26 MED ORDER — CELECOXIB 200 MG PO CAPS
200.0000 mg | ORAL_CAPSULE | ORAL | Status: AC
  Administered 2022-05-26: 200 mg via ORAL

## 2022-05-26 SURGICAL SUPPLY — 48 items
ADH SKN CLS APL DERMABOND .7 (GAUZE/BANDAGES/DRESSINGS) ×2
APL PRP STRL LF DISP 70% ISPRP (MISCELLANEOUS) ×1
APPLIER CLIP 11 MED OPEN (CLIP) ×4
APR CLP MED 11 20 MLT OPN (CLIP) ×2
BLADE SURG 15 STRL LF DISP TIS (BLADE) ×1 IMPLANT
BLADE SURG 15 STRL SS (BLADE) ×2
CANISTER SUCT 1200ML W/VALVE (MISCELLANEOUS) ×2 IMPLANT
CHLORAPREP W/TINT 26 (MISCELLANEOUS) ×2 IMPLANT
CLIP APPLIE 11 MED OPEN (CLIP) ×1 IMPLANT
COVER BACK TABLE 60X90IN (DRAPES) ×2 IMPLANT
COVER MAYO STAND STRL (DRAPES) ×2 IMPLANT
COVER PROBE W GEL 5X96 (DRAPES) ×2 IMPLANT
DERMABOND ADVANCED (GAUZE/BANDAGES/DRESSINGS) ×2
DERMABOND ADVANCED .7 DNX12 (GAUZE/BANDAGES/DRESSINGS) ×2 IMPLANT
DRAPE LAPAROSCOPIC ABDOMINAL (DRAPES) ×2 IMPLANT
DRAPE UTILITY XL STRL (DRAPES) ×2 IMPLANT
ELECT COATED BLADE 2.86 ST (ELECTRODE) ×2 IMPLANT
ELECT REM PT RETURN 9FT ADLT (ELECTROSURGICAL) ×2
ELECTRODE REM PT RTRN 9FT ADLT (ELECTROSURGICAL) ×1 IMPLANT
GLOVE BIO SURGEON STRL SZ 6.5 (GLOVE) ×1 IMPLANT
GLOVE BIO SURGEON STRL SZ7.5 (GLOVE) ×2 IMPLANT
GLOVE BIOGEL PI IND STRL 7.5 (GLOVE) IMPLANT
GLOVE BIOGEL PI INDICATOR 7.5 (GLOVE) ×1
GOWN STRL REUS W/ TWL LRG LVL3 (GOWN DISPOSABLE) ×2 IMPLANT
GOWN STRL REUS W/TWL LRG LVL3 (GOWN DISPOSABLE) ×4
ILLUMINATOR WAVEGUIDE N/F (MISCELLANEOUS) IMPLANT
KIT MARKER MARGIN INK (KITS) IMPLANT
LIGHT WAVEGUIDE WIDE FLAT (MISCELLANEOUS) IMPLANT
NDL HYPO 25X1 1.5 SAFETY (NEEDLE) ×2 IMPLANT
NDL SAFETY ECLIPSE 18X1.5 (NEEDLE) ×1 IMPLANT
NEEDLE HYPO 18GX1.5 SHARP (NEEDLE) ×2
NEEDLE HYPO 25X1 1.5 SAFETY (NEEDLE) ×4 IMPLANT
NS IRRIG 1000ML POUR BTL (IV SOLUTION) ×2 IMPLANT
PACK BASIN DAY SURGERY FS (CUSTOM PROCEDURE TRAY) ×2 IMPLANT
PENCIL SMOKE EVACUATOR (MISCELLANEOUS) ×2 IMPLANT
SLEEVE SCD COMPRESS KNEE MED (STOCKING) ×2 IMPLANT
SPIKE FLUID TRANSFER (MISCELLANEOUS) IMPLANT
SPONGE T-LAP 18X18 ~~LOC~~+RFID (SPONGE) ×2 IMPLANT
SUT ETHILON 3 0 FSL (SUTURE) IMPLANT
SUT MON AB 4-0 PC3 18 (SUTURE) ×4 IMPLANT
SUT SILK 3 0 PS 1 (SUTURE) IMPLANT
SUT VICRYL 3-0 CR8 SH (SUTURE) ×6 IMPLANT
SYR CONTROL 10ML LL (SYRINGE) ×4 IMPLANT
TOWEL GREEN STERILE FF (TOWEL DISPOSABLE) ×2 IMPLANT
TRACER MAGTRACE VIAL (MISCELLANEOUS) ×1 IMPLANT
TRAY FAXITRON CT DISP (TRAY / TRAY PROCEDURE) ×2 IMPLANT
TUBE CONNECTING 20X1/4 (TUBING) ×2 IMPLANT
YANKAUER SUCT BULB TIP NO VENT (SUCTIONS) ×2 IMPLANT

## 2022-05-26 NOTE — Transfer of Care (Signed)
Immediate Anesthesia Transfer of Care Note  Patient: Angela Lin  Procedure(s) Performed: BREAST LUMPECTOMY WITH EXCISION OF SENTINEL NODE (Left: Breast)  Patient Location: PACU  Anesthesia Type:General and Regional  Level of Consciousness: drowsy  Airway & Oxygen Therapy: Patient Spontanous Breathing and Patient connected to face mask oxygen  Post-op Assessment: Report given to RN and Post -op Vital signs reviewed and stable  Post vital signs: Reviewed and stable  Last Vitals:  Vitals Value Taken Time  BP    Temp    Pulse 62 05/26/22 0939  Resp 13 05/26/22 0939  SpO2 96 % 05/26/22 0939  Vitals shown include unvalidated device data.  Last Pain:  Vitals:   05/26/22 0654  TempSrc: Oral  PainSc: 0-No pain      Patients Stated Pain Goal: 8 (67/67/20 9470)  Complications: No notable events documented.

## 2022-05-26 NOTE — Anesthesia Procedure Notes (Signed)
Anesthesia Regional Block: Pectoralis block   Pre-Anesthetic Checklist: , timeout performed,  Correct Patient, Correct Site, Correct Laterality,  Correct Procedure, Correct Position, site marked,  Risks and benefits discussed,  Surgical consent,  Pre-op evaluation,  At surgeon's request and post-op pain management  Laterality: Left  Prep: chloraprep       Needles:  Injection technique: Single-shot  Needle Type: Echogenic Stimulator Needle     Needle Length: 10cm  Needle Gauge: 20     Additional Needles:   Procedures:,,,, ultrasound used (permanent image in chart),,    Narrative:  Start time: 05/26/2022 7:05 AM End time: 05/26/2022 7:10 AM Injection made incrementally with aspirations every 5 mL.  Performed by: Personally  Anesthesiologist: Merlinda Frederick, MD  Additional Notes: Functioning IV was confirmed and monitors were applied.  Sterile prep and drape,hand hygiene and sterile gloves were used. Ultrasound guidance: relevant anatomy identified, needle position confirmed, local anesthetic spread visualized around nerve(s)., vascular puncture avoided. Negative aspiration and negative test dose prior to incremental administration of local anesthetic. The patient tolerated the procedure well.

## 2022-05-26 NOTE — Discharge Instructions (Signed)
  Post Anesthesia Home Care Instructions  Activity: Get plenty of rest for the remainder of the day. A responsible individual must stay with you for 24 hours following the procedure.  For the next 24 hours, DO NOT: -Drive a car -Paediatric nurse -Drink alcoholic beverages -Take any medication unless instructed by your physician -Make any legal decisions or sign important papers.  Meals: Start with liquid foods such as gelatin or soup. Progress to regular foods as tolerated. Avoid greasy, spicy, heavy foods. If nausea and/or vomiting occur, drink only clear liquids until the nausea and/or vomiting subsides. Call your physician if vomiting continues.  Special Instructions/Symptoms: Your throat may feel dry or sore from the anesthesia or the breathing tube placed in your throat during surgery. If this causes discomfort, gargle with warm salt water. The discomfort should disappear within 24 hours.  If you had a scopolamine patch placed behind your ear for the management of post- operative nausea and/or vomiting:  1. The medication in the patch is effective for 72 hours, after which it should be removed.  Wrap patch in a tissue and discard in the trash. Wash hands thoroughly with soap and water. 2. You may remove the patch earlier than 72 hours if you experience unpleasant side effects which may include dry mouth, dizziness or visual disturbances. 3. Avoid touching the patch. Wash your hands with soap and water after contact with the patch.      May have Tylenol today at 1pm 05/26/2022

## 2022-05-26 NOTE — Progress Notes (Signed)
Assisted Dr. Bass with left, pectoralis, ultrasound guided block. Side rails up, monitors on throughout procedure. See vital signs in flow sheet. Tolerated Procedure well. 

## 2022-05-26 NOTE — H&P (Signed)
REFERRING PHYSICIAN: Leitha Bleak, MD  PROVIDER: Landry Corporal, MD  MRN: [id number] DOB: Sep 30, 1961 Subjective   Chief Complaint: Breast Cancer   History of Present Illness: Angela Lin is a 61 y.o. female who is seen today as an office consultation for evaluation of Breast Cancer .   We are asked to see the patient in consultation by Dr. Theadore Nan to evaluate her for a new left breast cancer. The patient is a 61 year old white female who recently felt a mass in the upper portion of the left breast. She brought this to her doctor's attention and was evaluated with mammogram and ultrasound. She was found to have a 2.6 cm mass in the upper outer quadrant of the left breast. The lymph nodes looked normal. 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 She is otherwise in pretty good health but she does smoke about a half a pack of cigarettes a day  Review of Systems: A complete review of systems was obtained from the patient. I have reviewed this information and discussed as appropriate with the patient. See HPI as well for other ROS.  ROS   Medical History: History reviewed. No pertinent past medical history.  Patient Active Problem List  Diagnosis   Malignant neoplasm of upper-outer quadrant of left breast in female, estrogen receptor positive (CMS-HCC)   Past Surgical History:  Procedure Laterality Date   REVISION TOTAL HIP ARTHROPLASTY    No Known Allergies  Current Outpatient Medications on File Prior to Visit  Medication Sig Dispense Refill   losartan (COZAAR) 100 MG tablet Take 1 tablet by mouth once daily   metoprolol succinate (TOPROL-XL) 100 MG XL tablet Take 1.5 tablet by mouth once daily   No current facility-administered medications on file prior to visit.   Family History  Problem Relation Age of Onset   High blood pressure (Hypertension) Mother    Social  History   Tobacco Use  Smoking Status Every Day   Types: Cigarettes  Smokeless Tobacco Never    Social History   Socioeconomic History   Marital status: Divorced  Tobacco Use   Smoking status: Every Day  Types: Cigarettes   Smokeless tobacco: Never   Objective:   Vitals:  BP: 132/84  Pulse: 70  Weight: 76.7 kg (169 lb)  Height: 165.1 cm (_0 )   Body mass index is 28.12 kg/m.  Physical Exam Vitals reviewed.  Constitutional:  General: She is not in acute distress. Appearance: Normal appearance.  HENT:  Head: Normocephalic and atraumatic.  Right Ear: External ear normal.  Left Ear: External ear normal.  Nose: Nose normal.  Mouth/Throat:  Mouth: Mucous membranes are moist.  Pharynx: Oropharynx is clear.  Eyes:  General: No scleral icterus. Extraocular Movements: Extraocular movements intact.  Conjunctiva/sclera: Conjunctivae normal.  Pupils: Pupils are equal, round, and reactive to light.  Cardiovascular:  Rate and Rhythm: Normal rate and regular rhythm.  Pulses: Normal pulses.  Heart sounds: Normal heart sounds.  Pulmonary:  Effort: Pulmonary effort is normal. No respiratory distress.  Breath sounds: Normal breath sounds.  Abdominal:  General: Bowel sounds are normal.  Palpations: Abdomen is soft.  Tenderness: There is no abdominal tenderness.  Musculoskeletal:  General: No swelling, tenderness or deformity. Normal range of motion.  Cervical back: Normal range of motion and neck supple.  Skin: General: Skin is warm and dry.  Coloration: Skin is not  jaundiced.  Neurological:  General: No focal deficit present.  Mental Status: She is alert and oriented to person, place, and time.  Psychiatric:  Mood and Affect: Mood normal.  Behavior: Behavior normal.     Breast: There is a 2 to 3 cm mobile palpable mass in the upper portion of the left breast. There are no overlying skin changes and it is not tethered to the chest wall. There is no palpable mass  in the right breast. There is no palpable axillary, supraclavicular, or cervical lymphadenopathy.  Labs, Imaging and Diagnostic Testing:  Assessment and Plan:   Diagnoses and all orders for this visit:  Malignant neoplasm of upper-outer quadrant of left breast in female, estrogen receptor positive (CMS-HCC) - [facility] Case Posting Request; Future    The patient appears to have a 2.6 cm neuroendocrine tumor with favorable markers in the upper portion of the left breast and clinically negative nodes. I have discussed with her in detail the different options for treatment and at this point she favors breast conservation which I feel is very reasonable. She is also a good candidate for sentinel node biopsy as well. I have discussed with her in detail the risks and benefits of the operation as well as some of the technical aspects and she understands and wishes to proceed. She has already met with medical and radiation oncology to discuss adjuvant therapy. I will also send her to physical therapy for preoperative lymphedema testing

## 2022-05-26 NOTE — Anesthesia Procedure Notes (Signed)
Procedure Name: LMA Insertion Date/Time: 05/26/2022 8:18 AM Performed by: Ezequiel Kayser, CRNA Pre-anesthesia Checklist: Patient identified, Emergency Drugs available, Suction available and Patient being monitored Patient Re-evaluated:Patient Re-evaluated prior to induction Oxygen Delivery Method: Circle System Utilized Preoxygenation: Pre-oxygenation with 100% oxygen Induction Type: IV induction Ventilation: Mask ventilation without difficulty LMA: LMA inserted LMA Size: 4.0 Number of attempts: 1 Airway Equipment and Method: Bite block Placement Confirmation: positive ETCO2 Tube secured with: Tape Dental Injury: Teeth and Oropharynx as per pre-operative assessment

## 2022-05-26 NOTE — Anesthesia Postprocedure Evaluation (Signed)
Anesthesia Post Note  Patient: Angela Lin  Procedure(s) Performed: BREAST LUMPECTOMY WITH EXCISION OF SENTINEL NODE (Left: Breast)     Patient location during evaluation: PACU Anesthesia Type: General Level of consciousness: awake Pain management: pain level controlled Vital Signs Assessment: post-procedure vital signs reviewed and stable Respiratory status: spontaneous breathing and respiratory function stable Cardiovascular status: stable Postop Assessment: no apparent nausea or vomiting Anesthetic complications: no   No notable events documented.  Last Vitals:  Vitals:   05/26/22 0954 05/26/22 1000  BP:  (!) 144/74  Pulse: 78 69  Resp: 12 15  Temp:    SpO2: 94% 93%    Last Pain:  Vitals:   05/26/22 1000  TempSrc:   PainSc: 0-No pain                 Merlinda Frederick

## 2022-05-26 NOTE — Interval H&P Note (Signed)
History and Physical Interval Note:  05/26/2022 8:04 AM  Angela Lin  has presented today for surgery, with the diagnosis of LEFT BREAST CANCER.  The various methods of treatment have been discussed with the patient and family. After consideration of risks, benefits and other options for treatment, the patient has consented to  Procedure(s) with comments: BREAST LUMPECTOMY WITH EXCISION OF SENTINEL NODE (Left) - GEN & PEC BLOCK as a surgical intervention.  The patient's history has been reviewed, patient examined, no change in status, stable for surgery.  I have reviewed the patient's chart and labs.  Questions were answered to the patient's satisfaction.     Autumn Messing III

## 2022-05-26 NOTE — Op Note (Signed)
05/26/2022  9:34 AM  PATIENT:  Angela Lin  61 y.o. female  PRE-OPERATIVE DIAGNOSIS:  LEFT BREAST CANCER  POST-OPERATIVE DIAGNOSIS:  LEFT BREAST CANCER  PROCEDURE:  Procedure(s) with comments: LEFT BREAST LUMPECTOMY WITH DEEP LEFT AXILLARY SENTINEL LYMPH NODE BIOPSY  SURGEON:  Surgeon(s) and Role:    * Jovita Kussmaul, MD - Primary  PHYSICIAN ASSISTANT:   ASSISTANTS: none   ANESTHESIA:   local and general  EBL:  1 mL   BLOOD ADMINISTERED:none  DRAINS: none   LOCAL MEDICATIONS USED:  MARCAINE     SPECIMEN:  Source of Specimen:  left breast tissue and sentinel nodes x 3  DISPOSITION OF SPECIMEN:  PATHOLOGY  COUNTS:  YES  TOURNIQUET:  * No tourniquets in log *  DICTATION: .Dragon Dictation  After informed consent was obtained the patient was brought to the operating room and placed in the supine position on the operating table.  After adequate induction of general anesthesia the patient's left chest, breast, and axillary area were prepped with ChloraPrep, allowed to dry, and draped in usual sterile manner.  An appropriate timeout was performed.  2 cc of iron oxide were then injected in the subareolar plexus of the left breast and the breast was massaged for about 5 minutes.  The patient had a palpable cancer in the upper outer portion of the left breast.  The area around this was infiltrated with quarter percent Marcaine.  I elected to make an elliptical incision in the skin overlying the area of the cancer.  The incision was carried through the skin and subcutaneous tissue sharply with the electrocautery.  Dissection was then carried widely around the palpable cancer.  Once the specimen was removed it was oriented with the appropriate paint colors.  A specimen radiograph was obtained that showed the clip to be near the center of the specimen.  The specimen was then sent to pathology for further evaluation.  Because of the proximity of the lumpectomy cavity to the axilla  I elected to enter the deep left axillary space through the lumpectomy cavity.  Dissection was then carried out under the direction of the mag trace.  I was able to identify 3 lymph nodes with signal.  Each of these was excised sharply with the electrocautery and the surrounding small vessels and lymphatics were controlled with clips.  These were sent as sentinel nodes numbers 1 through 3.  No other hot or palpable nodes were identified in the left axilla.  Hemostasis was achieved using the Bovie electrocautery.  The deep left axillary space was closed with interrupted 3-0 Vicryl stitches.  The lumpectomy cavity was then marked with clips and irrigated with saline.  The lumpectomy cavity was also closed with interrupted 3-0 Vicryl stitches.  The skin was then closed with a running 4-0 Monocryl subcuticular stitch.  Dermabond dressings were applied.  The patient tolerated the procedure well.  At the end of the case all needle sponge and instrument counts were correct.  The patient was then awakened and taken to recovery in stable condition.  PLAN OF CARE: Discharge to home after PACU  PATIENT DISPOSITION:  PACU - hemodynamically stable.   Delay start of Pharmacological VTE agent (>24hrs) due to surgical blood loss or risk of bleeding: not applicable

## 2022-05-27 ENCOUNTER — Encounter (HOSPITAL_BASED_OUTPATIENT_CLINIC_OR_DEPARTMENT_OTHER): Payer: Self-pay | Admitting: General Surgery

## 2022-05-28 LAB — SURGICAL PATHOLOGY

## 2022-05-31 ENCOUNTER — Other Ambulatory Visit: Payer: Self-pay | Admitting: *Deleted

## 2022-05-31 ENCOUNTER — Encounter: Payer: Self-pay | Admitting: *Deleted

## 2022-06-08 NOTE — Progress Notes (Incomplete)
Patient Care Team: Cari Caraway, MD as PCP - General (Family Medicine) Ernst Bowler Gwenith Daily, MD as Consulting Physician (Allergy and Immunology) Jovita Kussmaul, MD as Consulting Physician (General Surgery) Rockwell Germany, RN as Oncology Nurse Navigator Mauro Kaufmann, RN as Oncology Nurse Navigator Nicholas Lose, MD as Consulting Physician (Hematology and Oncology)  DIAGNOSIS: No diagnosis found.  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 female is here because of recent diagnosis of left breast invasive malignancy. She presents to the clinic today for a follow-up.    ALLERGIES:  has No Known Allergies.  MEDICATIONS:  Current Outpatient Medications  Medication Sig Dispense Refill   amLODipine (NORVASC) 5 MG tablet Take 5 mg by mouth daily.     aspirin 81 MG chewable tablet Chew 81 mg by mouth daily.     Biotin w/ Vitamins C & E (HAIR/SKIN/NAILS PO) Take 1 tablet by mouth daily.     Cholecalciferol (VITAMIN D3 PO) Take by mouth.     diphenhydrAMINE (BENADRYL) 25 mg capsule Take 25 mg by mouth at bedtime.     Glucosamine HCl-MSM (GLUCOSAMINE-MSM PO) Take 2 tablets by mouth daily.     ibuprofen (ADVIL,MOTRIN) 200 MG tablet Take 200 mg by mouth every 6 (six) hours as needed for headache or moderate pain.      losartan (COZAAR) 100 MG tablet Take 100 mg by mouth daily.     Metoprolol Tartrate (FIRST - METOPROLOL PO) Take by mouth daily.     milk thistle 175 MG tablet Take 175 mg by mouth 2 (two) times daily.      oxyCODONE (ROXICODONE) 5 MG immediate release tablet Take 1 tablet (5  mg total) by mouth every 6 (six) hours as needed for severe pain. 15 tablet 0   tetrahydrozoline 0.05 % ophthalmic solution Place 2 drops into both eyes daily.     No current facility-administered medications for this visit.    PHYSICAL EXAMINATION: ECOG PERFORMANCE STATUS: {CHL ONC ECOG PS:[id number]}  There were no vitals filed for this visit. There were no vitals filed for this visit.  BREAST:*** No palpable masses or nodules in either right or left breasts. No palpable axillary supraclavicular or infraclavicular adenopathy no breast tenderness or nipple discharge. (exam performed in the presence of a chaperone)  LABORATORY DATA:  I have reviewed the data as listed    Latest Ref Rng & Units 12/09/2018    4:07 AM 06/05/2016    4:00 AM  CMP  Glucose 70 - 99 mg/dL 153  141   BUN 6 - 20 mg/dL 9  8   Creatinine 0.44 - 1.00 mg/dL 0.68  0.62   Sodium 135 - 145 mmol/L 133  137   Potassium 3.5 - 5.1 mmol/L 3.8  4.2   Chloride 98 - 111 mmol/L 100  103   CO2 22 - 32 mmol/L 25  28   Calcium 8.9 - 10.3 mg/dL 8.7  9.2     Lab Results  Component Value Date   WBC 10.7 (H) 12/09/2018   HGB 13.1 12/09/2018   HCT 38.8 12/09/2018   MCV 92.4 12/09/2018   PLT 218  12/09/2018    ASSESSMENT & PLAN:  No problem-specific Assessment & Plan notes found for this encounter.    No orders of the defined types were placed in this encounter.  The patient has a good understanding of the overall plan. she agrees with it. she will call with any problems that may develop before the next visit here. Total time spent: 30 mins including face to face time and time spent for planning, charting and co-ordination of care   Suzzette Righter, [place] 06/08/22    I Gardiner Coins am scribing for Dr. Lindi Adie  ***

## 2022-06-15 ENCOUNTER — Encounter: Payer: Self-pay | Admitting: *Deleted

## 2022-06-16 ENCOUNTER — Encounter: Payer: Self-pay | Admitting: Physical Therapy

## 2022-06-16 ENCOUNTER — Ambulatory Visit: Payer: 59

## 2022-06-17 ENCOUNTER — Ambulatory Visit: Payer: 59 | Admitting: Hematology and Oncology

## 2022-06-21 ENCOUNTER — Telehealth: Payer: Self-pay | Admitting: Hematology and Oncology

## 2022-06-21 NOTE — Assessment & Plan Note (Addendum)
05/26/22: Left Lumpectomy: NET 2.8 cm, margins Neg, 0./2LN Skin not involved Er 90%, PR 90%, Ki 67: 8%  Pathology counseling: I discussed the final pathology report of the patient provided  a copy of this report. I discussed the margins as well as lymph node surgeries. We also discussed the final staging along with previously performed ER/PR and HER-2/neu testing.  Treatment Plan: 1.  adjuvant radiation 2.  Adjuvant antiestrogen therapy  Cellulitis at surgical site: Currently on antibiotics.  She has an appoint with Dr. Carolynne Edouard today.  RTC after XRT

## 2022-06-21 NOTE — Telephone Encounter (Signed)
Rescheduled appointment per 6/26 secure chat. Patient is aware of the changes made to her upcoming appointment.

## 2022-06-22 ENCOUNTER — Other Ambulatory Visit: Payer: Self-pay

## 2022-06-22 ENCOUNTER — Ambulatory Visit: Payer: 59 | Admitting: Radiation Oncology

## 2022-06-22 ENCOUNTER — Ambulatory Visit
Admission: RE | Admit: 2022-06-22 | Discharge: 2022-06-22 | Disposition: A | Discharge status: Home / Self Care | Payer: 59 | Source: Ambulatory Visit | Attending: Radiation Oncology | Admitting: Radiation Oncology

## 2022-06-22 ENCOUNTER — Encounter: Payer: Self-pay | Admitting: Radiation Oncology

## 2022-06-22 ENCOUNTER — Ambulatory Visit: Payer: 59 | Admitting: Hematology and Oncology

## 2022-06-22 ENCOUNTER — Inpatient Hospital Stay: Payer: 59 | Attending: Hematology and Oncology | Admitting: Hematology and Oncology

## 2022-06-22 VITALS — BP 142/61 | HR 64 | Temp 98.4°F | Resp 17 | Ht 65.0 in | Wt 171.0 lb

## 2022-06-22 DIAGNOSIS — Z170 Estrogen receptor positive status [ER+]: Secondary | ICD-10-CM | POA: Insufficient documentation

## 2022-06-22 DIAGNOSIS — F17210 Nicotine dependence, cigarettes, uncomplicated: Secondary | ICD-10-CM | POA: Insufficient documentation

## 2022-06-22 DIAGNOSIS — N61 Mastitis without abscess: Secondary | ICD-10-CM | POA: Diagnosis not present

## 2022-06-22 DIAGNOSIS — C7A8 Other malignant neuroendocrine tumors: Secondary | ICD-10-CM

## 2022-06-22 DIAGNOSIS — C50912 Malignant neoplasm of unspecified site of left female breast: Secondary | ICD-10-CM | POA: Insufficient documentation

## 2022-06-22 DIAGNOSIS — F1090 Alcohol use, unspecified, uncomplicated: Secondary | ICD-10-CM | POA: Insufficient documentation

## 2022-06-22 DIAGNOSIS — Z801 Family history of malignant neoplasm of trachea, bronchus and lung: Secondary | ICD-10-CM | POA: Diagnosis not present

## 2022-06-22 DIAGNOSIS — C50412 Malignant neoplasm of upper-outer quadrant of left female breast: Secondary | ICD-10-CM

## 2022-06-23 DIAGNOSIS — C50419 Malignant neoplasm of upper-outer quadrant of unspecified female breast: Secondary | ICD-10-CM | POA: Insufficient documentation

## 2022-06-23 NOTE — Addendum Note (Signed)
Encounter addended by: Kyung Rudd, MD on: 06/23/2022 7:12 AM  Actions taken: Problem List modified, Visit diagnoses modified

## 2022-06-30 ENCOUNTER — Encounter: Payer: Self-pay | Admitting: *Deleted

## 2022-07-01 ENCOUNTER — Other Ambulatory Visit: Payer: Self-pay

## 2022-07-01 ENCOUNTER — Ambulatory Visit
Admission: RE | Admit: 2022-07-01 | Discharge: 2022-07-01 | Disposition: A | Discharge status: Home / Self Care | Payer: 59 | Source: Ambulatory Visit | Attending: Radiation Oncology | Admitting: Radiation Oncology

## 2022-07-01 DIAGNOSIS — C50412 Malignant neoplasm of upper-outer quadrant of left female breast: Secondary | ICD-10-CM | POA: Diagnosis not present

## 2022-07-09 DIAGNOSIS — C50412 Malignant neoplasm of upper-outer quadrant of left female breast: Secondary | ICD-10-CM | POA: Diagnosis not present

## 2022-07-14 ENCOUNTER — Ambulatory Visit: Admission: RE | Admit: 2022-07-14 | Payer: 59 | Source: Ambulatory Visit | Admitting: Radiation Oncology

## 2022-07-14 ENCOUNTER — Other Ambulatory Visit: Payer: Self-pay

## 2022-07-15 ENCOUNTER — Other Ambulatory Visit: Payer: Self-pay

## 2022-07-15 ENCOUNTER — Encounter: Payer: Self-pay | Admitting: *Deleted

## 2022-07-15 ENCOUNTER — Ambulatory Visit
Admission: RE | Admit: 2022-07-15 | Discharge: 2022-07-15 | Disposition: A | Discharge status: Home / Self Care | Payer: 59 | Source: Ambulatory Visit | Attending: Radiation Oncology | Admitting: Radiation Oncology

## 2022-07-15 ENCOUNTER — Telehealth: Payer: Self-pay | Admitting: Hematology and Oncology

## 2022-07-15 DIAGNOSIS — C50412 Malignant neoplasm of upper-outer quadrant of left female breast: Secondary | ICD-10-CM | POA: Diagnosis not present

## 2022-07-15 LAB — RAD ONC ARIA SESSION SUMMARY
Course Elapsed Days: 0
Plan Fractions Treated to Date: 1
Plan Prescribed Dose Per Fraction: 2.66 Gy
Plan Total Fractions Prescribed: 16
Plan Total Prescribed Dose: 42.56 Gy
Reference Point Dosage Given to Date: 2.66 Gy
Reference Point Session Dosage Given: 2.66 Gy
Session Number: 1

## 2022-07-15 NOTE — Telephone Encounter (Signed)
.  Called patient to schedule appointment per 7/20 inbasket, patient is aware of date and time.   

## 2022-07-15 NOTE — Progress Notes (Signed)
Pt here for patient teaching.  Pt given Radiation and You booklet, skin care instructions, alra deodorant and Radiaplex.    Reviewed areas of pertinence such as fatigue, hair loss, skin changes, breast tenderness, and breast swelling. Pt able to give teach back of to pat skin and use unscented/gentle soap,apply Radiaplex bid, avoid applying anything to skin within 4 hours of treatment, avoid wearing an under wire bra, and to use an electric razor if they must shave. Pt verbalizes understanding of information given and will contact nursing with any questions or concerns.    Zionna Homewood M. Temia Debroux RN, BSN  

## 2022-07-16 ENCOUNTER — Other Ambulatory Visit: Payer: Self-pay

## 2022-07-16 ENCOUNTER — Ambulatory Visit
Admission: RE | Admit: 2022-07-16 | Discharge: 2022-07-16 | Disposition: A | Discharge status: Home / Self Care | Payer: 59 | Source: Ambulatory Visit | Attending: Radiation Oncology | Admitting: Radiation Oncology

## 2022-07-16 DIAGNOSIS — C50412 Malignant neoplasm of upper-outer quadrant of left female breast: Secondary | ICD-10-CM | POA: Diagnosis not present

## 2022-07-16 LAB — RAD ONC ARIA SESSION SUMMARY
Course Elapsed Days: 1
Plan Fractions Treated to Date: 2
Plan Prescribed Dose Per Fraction: 2.66 Gy
Plan Total Fractions Prescribed: 16
Plan Total Prescribed Dose: 42.56 Gy
Reference Point Dosage Given to Date: 5.32 Gy
Reference Point Session Dosage Given: 2.66 Gy
Session Number: 2

## 2022-07-16 MED ORDER — ALRA NON-METALLIC DEODORANT (RAD-ONC)
1.0000 | Freq: Once | TOPICAL | Status: AC
  Administered 2022-07-16: 1 via TOPICAL

## 2022-07-16 MED ORDER — RADIAPLEXRX EX GEL: Freq: Once | CUTANEOUS | Status: AC

## 2022-07-19 ENCOUNTER — Other Ambulatory Visit: Payer: Self-pay

## 2022-07-19 ENCOUNTER — Ambulatory Visit
Admission: RE | Admit: 2022-07-19 | Discharge: 2022-07-19 | Disposition: A | Discharge status: Home / Self Care | Payer: 59 | Source: Ambulatory Visit | Attending: Radiation Oncology | Admitting: Radiation Oncology

## 2022-07-19 DIAGNOSIS — C50412 Malignant neoplasm of upper-outer quadrant of left female breast: Secondary | ICD-10-CM | POA: Diagnosis not present

## 2022-07-19 LAB — RAD ONC ARIA SESSION SUMMARY
Course Elapsed Days: 4
Plan Fractions Treated to Date: 3
Plan Prescribed Dose Per Fraction: 2.66 Gy
Plan Total Fractions Prescribed: 16
Plan Total Prescribed Dose: 42.56 Gy
Reference Point Dosage Given to Date: 7.98 Gy
Reference Point Session Dosage Given: 2.66 Gy
Session Number: 3

## 2022-07-20 ENCOUNTER — Other Ambulatory Visit: Payer: Self-pay

## 2022-07-20 ENCOUNTER — Ambulatory Visit
Admission: RE | Admit: 2022-07-20 | Discharge: 2022-07-20 | Disposition: A | Discharge status: Home / Self Care | Payer: 59 | Source: Ambulatory Visit | Attending: Radiation Oncology | Admitting: Radiation Oncology

## 2022-07-20 DIAGNOSIS — C50412 Malignant neoplasm of upper-outer quadrant of left female breast: Secondary | ICD-10-CM | POA: Diagnosis not present

## 2022-07-20 LAB — RAD ONC ARIA SESSION SUMMARY
Course Elapsed Days: 5
Plan Fractions Treated to Date: 4
Plan Prescribed Dose Per Fraction: 2.66 Gy
Plan Total Fractions Prescribed: 16
Plan Total Prescribed Dose: 42.56 Gy
Reference Point Dosage Given to Date: 10.64 Gy
Reference Point Session Dosage Given: 2.66 Gy
Session Number: 4

## 2022-07-21 ENCOUNTER — Other Ambulatory Visit: Payer: Self-pay

## 2022-07-21 ENCOUNTER — Ambulatory Visit
Admission: RE | Admit: 2022-07-21 | Discharge: 2022-07-21 | Disposition: A | Discharge status: Home / Self Care | Payer: 59 | Source: Ambulatory Visit | Attending: Radiation Oncology | Admitting: Radiation Oncology

## 2022-07-21 DIAGNOSIS — C50412 Malignant neoplasm of upper-outer quadrant of left female breast: Secondary | ICD-10-CM | POA: Diagnosis not present

## 2022-07-21 LAB — RAD ONC ARIA SESSION SUMMARY
Course Elapsed Days: 6
Plan Fractions Treated to Date: 5
Plan Prescribed Dose Per Fraction: 2.66 Gy
Plan Total Fractions Prescribed: 16
Plan Total Prescribed Dose: 42.56 Gy
Reference Point Dosage Given to Date: 13.3 Gy
Reference Point Session Dosage Given: 2.66 Gy
Session Number: 5

## 2022-07-22 ENCOUNTER — Encounter: Payer: Self-pay | Admitting: *Deleted

## 2022-07-22 ENCOUNTER — Other Ambulatory Visit: Payer: Self-pay

## 2022-07-22 ENCOUNTER — Ambulatory Visit
Admission: RE | Admit: 2022-07-22 | Discharge: 2022-07-22 | Disposition: A | Discharge status: Home / Self Care | Payer: 59 | Source: Ambulatory Visit | Attending: Radiation Oncology | Admitting: Radiation Oncology

## 2022-07-22 DIAGNOSIS — C50412 Malignant neoplasm of upper-outer quadrant of left female breast: Secondary | ICD-10-CM | POA: Diagnosis not present

## 2022-07-22 LAB — RAD ONC ARIA SESSION SUMMARY
Course Elapsed Days: 7
Plan Fractions Treated to Date: 6
Plan Prescribed Dose Per Fraction: 2.66 Gy
Plan Total Fractions Prescribed: 16
Plan Total Prescribed Dose: 42.56 Gy
Reference Point Dosage Given to Date: 15.96 Gy
Reference Point Session Dosage Given: 2.66 Gy
Session Number: 6

## 2022-07-23 ENCOUNTER — Other Ambulatory Visit: Payer: Self-pay

## 2022-07-23 ENCOUNTER — Ambulatory Visit
Admission: RE | Admit: 2022-07-23 | Discharge: 2022-07-23 | Disposition: A | Discharge status: Home / Self Care | Payer: 59 | Source: Ambulatory Visit | Attending: Radiation Oncology | Admitting: Radiation Oncology

## 2022-07-23 DIAGNOSIS — C50412 Malignant neoplasm of upper-outer quadrant of left female breast: Secondary | ICD-10-CM | POA: Diagnosis not present

## 2022-07-23 LAB — RAD ONC ARIA SESSION SUMMARY
Course Elapsed Days: 8
Plan Fractions Treated to Date: 7
Plan Prescribed Dose Per Fraction: 2.66 Gy
Plan Total Fractions Prescribed: 16
Plan Total Prescribed Dose: 42.56 Gy
Reference Point Dosage Given to Date: 18.62 Gy
Reference Point Session Dosage Given: 2.66 Gy
Session Number: 7

## 2022-07-26 ENCOUNTER — Ambulatory Visit
Admission: RE | Admit: 2022-07-26 | Discharge: 2022-07-26 | Disposition: A | Discharge status: Home / Self Care | Payer: 59 | Source: Ambulatory Visit | Attending: Radiation Oncology | Admitting: Radiation Oncology

## 2022-07-26 ENCOUNTER — Other Ambulatory Visit: Payer: Self-pay

## 2022-07-26 DIAGNOSIS — C50412 Malignant neoplasm of upper-outer quadrant of left female breast: Secondary | ICD-10-CM | POA: Diagnosis not present

## 2022-07-26 LAB — RAD ONC ARIA SESSION SUMMARY
Course Elapsed Days: 11
Plan Fractions Treated to Date: 8
Plan Prescribed Dose Per Fraction: 2.66 Gy
Plan Total Fractions Prescribed: 16
Plan Total Prescribed Dose: 42.56 Gy
Reference Point Dosage Given to Date: 21.28 Gy
Reference Point Session Dosage Given: 2.66 Gy
Session Number: 8

## 2022-07-27 ENCOUNTER — Ambulatory Visit
Admission: RE | Admit: 2022-07-27 | Discharge: 2022-07-27 | Disposition: A | Discharge status: Home / Self Care | Payer: 59 | Source: Ambulatory Visit | Attending: Radiation Oncology | Admitting: Radiation Oncology

## 2022-07-27 ENCOUNTER — Other Ambulatory Visit: Payer: Self-pay

## 2022-07-27 DIAGNOSIS — C50412 Malignant neoplasm of upper-outer quadrant of left female breast: Secondary | ICD-10-CM | POA: Diagnosis present

## 2022-07-27 LAB — RAD ONC ARIA SESSION SUMMARY
Course Elapsed Days: 12
Plan Fractions Treated to Date: 9
Plan Prescribed Dose Per Fraction: 2.66 Gy
Plan Total Fractions Prescribed: 16
Plan Total Prescribed Dose: 42.56 Gy
Reference Point Dosage Given to Date: 23.94 Gy
Reference Point Session Dosage Given: 2.66 Gy
Session Number: 9

## 2022-07-28 ENCOUNTER — Other Ambulatory Visit: Payer: Self-pay

## 2022-07-28 ENCOUNTER — Ambulatory Visit
Admission: RE | Admit: 2022-07-28 | Discharge: 2022-07-28 | Disposition: A | Discharge status: Home / Self Care | Payer: 59 | Source: Ambulatory Visit | Attending: Radiation Oncology | Admitting: Radiation Oncology

## 2022-07-28 DIAGNOSIS — C50412 Malignant neoplasm of upper-outer quadrant of left female breast: Secondary | ICD-10-CM | POA: Diagnosis not present

## 2022-07-28 LAB — RAD ONC ARIA SESSION SUMMARY
Course Elapsed Days: 13
Plan Fractions Treated to Date: 10
Plan Prescribed Dose Per Fraction: 2.66 Gy
Plan Total Fractions Prescribed: 16
Plan Total Prescribed Dose: 42.56 Gy
Reference Point Dosage Given to Date: 26.6 Gy
Reference Point Session Dosage Given: 2.66 Gy
Session Number: 10

## 2022-07-29 ENCOUNTER — Ambulatory Visit
Admission: RE | Admit: 2022-07-29 | Discharge: 2022-07-29 | Disposition: A | Discharge status: Home / Self Care | Payer: 59 | Source: Ambulatory Visit | Attending: Radiation Oncology | Admitting: Radiation Oncology

## 2022-07-29 ENCOUNTER — Other Ambulatory Visit: Payer: Self-pay

## 2022-07-29 DIAGNOSIS — C50412 Malignant neoplasm of upper-outer quadrant of left female breast: Secondary | ICD-10-CM | POA: Diagnosis not present

## 2022-07-29 LAB — RAD ONC ARIA SESSION SUMMARY
Course Elapsed Days: 14
Plan Fractions Treated to Date: 11
Plan Prescribed Dose Per Fraction: 2.66 Gy
Plan Total Fractions Prescribed: 16
Plan Total Prescribed Dose: 42.56 Gy
Reference Point Dosage Given to Date: 29.26 Gy
Reference Point Session Dosage Given: 2.66 Gy
Session Number: 11

## 2022-07-30 ENCOUNTER — Ambulatory Visit: Payer: 59 | Admitting: Radiation Oncology

## 2022-07-30 ENCOUNTER — Other Ambulatory Visit: Payer: Self-pay

## 2022-07-30 ENCOUNTER — Ambulatory Visit
Admission: RE | Admit: 2022-07-30 | Discharge: 2022-07-30 | Disposition: A | Discharge status: Home / Self Care | Payer: 59 | Source: Ambulatory Visit | Attending: Radiation Oncology | Admitting: Radiation Oncology

## 2022-07-30 DIAGNOSIS — C50412 Malignant neoplasm of upper-outer quadrant of left female breast: Secondary | ICD-10-CM | POA: Diagnosis not present

## 2022-07-30 LAB — RAD ONC ARIA SESSION SUMMARY
Course Elapsed Days: 15
Plan Fractions Treated to Date: 12
Plan Prescribed Dose Per Fraction: 2.66 Gy
Plan Total Fractions Prescribed: 16
Plan Total Prescribed Dose: 42.56 Gy
Reference Point Dosage Given to Date: 31.92 Gy
Reference Point Session Dosage Given: 2.66 Gy
Session Number: 12

## 2022-08-02 ENCOUNTER — Other Ambulatory Visit: Payer: Self-pay

## 2022-08-02 ENCOUNTER — Ambulatory Visit
Admission: RE | Admit: 2022-08-02 | Discharge: 2022-08-02 | Disposition: A | Discharge status: Home / Self Care | Payer: 59 | Source: Ambulatory Visit | Attending: Radiation Oncology | Admitting: Radiation Oncology

## 2022-08-02 DIAGNOSIS — C50412 Malignant neoplasm of upper-outer quadrant of left female breast: Secondary | ICD-10-CM | POA: Diagnosis not present

## 2022-08-02 LAB — RAD ONC ARIA SESSION SUMMARY
Course Elapsed Days: 18
Plan Fractions Treated to Date: 13
Plan Prescribed Dose Per Fraction: 2.66 Gy
Plan Total Fractions Prescribed: 16
Plan Total Prescribed Dose: 42.56 Gy
Reference Point Dosage Given to Date: 34.58 Gy
Reference Point Session Dosage Given: 2.66 Gy
Session Number: 13

## 2022-08-03 ENCOUNTER — Other Ambulatory Visit: Payer: Self-pay

## 2022-08-03 ENCOUNTER — Ambulatory Visit
Admission: RE | Admit: 2022-08-03 | Discharge: 2022-08-03 | Disposition: A | Discharge status: Home / Self Care | Payer: 59 | Source: Ambulatory Visit | Attending: Radiation Oncology | Admitting: Radiation Oncology

## 2022-08-03 DIAGNOSIS — C50412 Malignant neoplasm of upper-outer quadrant of left female breast: Secondary | ICD-10-CM | POA: Diagnosis not present

## 2022-08-03 LAB — RAD ONC ARIA SESSION SUMMARY
Course Elapsed Days: 19
Plan Fractions Treated to Date: 14
Plan Prescribed Dose Per Fraction: 2.66 Gy
Plan Total Fractions Prescribed: 16
Plan Total Prescribed Dose: 42.56 Gy
Reference Point Dosage Given to Date: 37.24 Gy
Reference Point Session Dosage Given: 2.66 Gy
Session Number: 14

## 2022-08-04 ENCOUNTER — Ambulatory Visit
Admission: RE | Admit: 2022-08-04 | Discharge: 2022-08-04 | Disposition: A | Discharge status: Home / Self Care | Payer: 59 | Source: Ambulatory Visit | Attending: Radiation Oncology | Admitting: Radiation Oncology

## 2022-08-04 ENCOUNTER — Other Ambulatory Visit: Payer: Self-pay

## 2022-08-04 DIAGNOSIS — C50412 Malignant neoplasm of upper-outer quadrant of left female breast: Secondary | ICD-10-CM | POA: Diagnosis not present

## 2022-08-04 LAB — RAD ONC ARIA SESSION SUMMARY
Course Elapsed Days: 20
Plan Fractions Treated to Date: 15
Plan Prescribed Dose Per Fraction: 2.66 Gy
Plan Total Fractions Prescribed: 16
Plan Total Prescribed Dose: 42.56 Gy
Reference Point Dosage Given to Date: 39.9 Gy
Reference Point Session Dosage Given: 2.66 Gy
Session Number: 15

## 2022-08-05 ENCOUNTER — Ambulatory Visit: Payer: 59

## 2022-08-05 ENCOUNTER — Other Ambulatory Visit: Payer: Self-pay

## 2022-08-05 DIAGNOSIS — C50412 Malignant neoplasm of upper-outer quadrant of left female breast: Secondary | ICD-10-CM | POA: Diagnosis not present

## 2022-08-05 LAB — RAD ONC ARIA SESSION SUMMARY
Course Elapsed Days: 21
Plan Fractions Treated to Date: 16
Plan Prescribed Dose Per Fraction: 2.66 Gy
Plan Total Fractions Prescribed: 16
Plan Total Prescribed Dose: 42.56 Gy
Reference Point Dosage Given to Date: 42.56 Gy
Reference Point Session Dosage Given: 2.66 Gy
Session Number: 16

## 2022-08-06 ENCOUNTER — Ambulatory Visit
Admission: RE | Admit: 2022-08-06 | Discharge: 2022-08-06 | Disposition: A | Discharge status: Home / Self Care | Payer: 59 | Source: Ambulatory Visit | Attending: Radiation Oncology | Admitting: Radiation Oncology

## 2022-08-06 ENCOUNTER — Other Ambulatory Visit: Payer: Self-pay

## 2022-08-06 DIAGNOSIS — C50412 Malignant neoplasm of upper-outer quadrant of left female breast: Secondary | ICD-10-CM | POA: Diagnosis not present

## 2022-08-06 LAB — RAD ONC ARIA SESSION SUMMARY
Course Elapsed Days: 22
Plan Fractions Treated to Date: 1
Plan Prescribed Dose Per Fraction: 2 Gy
Plan Total Fractions Prescribed: 4
Plan Total Prescribed Dose: 8 Gy
Reference Point Dosage Given to Date: 44.56 Gy
Reference Point Session Dosage Given: 2 Gy
Session Number: 17

## 2022-08-09 ENCOUNTER — Ambulatory Visit: Payer: 59

## 2022-08-09 ENCOUNTER — Other Ambulatory Visit: Payer: Self-pay

## 2022-08-09 ENCOUNTER — Ambulatory Visit
Admission: RE | Admit: 2022-08-09 | Discharge: 2022-08-09 | Disposition: A | Discharge status: Home / Self Care | Payer: 59 | Source: Ambulatory Visit | Attending: Radiation Oncology | Admitting: Radiation Oncology

## 2022-08-09 DIAGNOSIS — C50412 Malignant neoplasm of upper-outer quadrant of left female breast: Secondary | ICD-10-CM | POA: Diagnosis not present

## 2022-08-09 LAB — RAD ONC ARIA SESSION SUMMARY
Course Elapsed Days: 25
Plan Fractions Treated to Date: 2
Plan Prescribed Dose Per Fraction: 2 Gy
Plan Total Fractions Prescribed: 4
Plan Total Prescribed Dose: 8 Gy
Reference Point Dosage Given to Date: 46.56 Gy
Reference Point Session Dosage Given: 2 Gy
Session Number: 18

## 2022-08-10 ENCOUNTER — Other Ambulatory Visit: Payer: Self-pay

## 2022-08-10 ENCOUNTER — Encounter: Payer: Self-pay | Admitting: *Deleted

## 2022-08-10 ENCOUNTER — Ambulatory Visit: Payer: 59

## 2022-08-10 ENCOUNTER — Ambulatory Visit
Admission: RE | Admit: 2022-08-10 | Discharge: 2022-08-10 | Disposition: A | Discharge status: Home / Self Care | Payer: 59 | Source: Ambulatory Visit | Attending: Radiation Oncology | Admitting: Radiation Oncology

## 2022-08-10 DIAGNOSIS — C50412 Malignant neoplasm of upper-outer quadrant of left female breast: Secondary | ICD-10-CM | POA: Diagnosis not present

## 2022-08-10 DIAGNOSIS — C7A8 Other malignant neuroendocrine tumors: Secondary | ICD-10-CM

## 2022-08-10 LAB — RAD ONC ARIA SESSION SUMMARY
Course Elapsed Days: 26
Plan Fractions Treated to Date: 3
Plan Prescribed Dose Per Fraction: 2 Gy
Plan Total Fractions Prescribed: 4
Plan Total Prescribed Dose: 8 Gy
Reference Point Dosage Given to Date: 48.56 Gy
Reference Point Session Dosage Given: 2 Gy
Session Number: 19

## 2022-08-11 ENCOUNTER — Ambulatory Visit
Admission: RE | Admit: 2022-08-11 | Discharge: 2022-08-11 | Disposition: A | Discharge status: Home / Self Care | Payer: 59 | Source: Ambulatory Visit | Attending: Radiation Oncology | Admitting: Radiation Oncology

## 2022-08-11 ENCOUNTER — Encounter: Payer: Self-pay | Admitting: Radiation Oncology

## 2022-08-11 ENCOUNTER — Other Ambulatory Visit: Payer: Self-pay

## 2022-08-11 DIAGNOSIS — C50412 Malignant neoplasm of upper-outer quadrant of left female breast: Secondary | ICD-10-CM | POA: Diagnosis not present

## 2022-08-11 LAB — RAD ONC ARIA SESSION SUMMARY
Course Elapsed Days: 27
Plan Fractions Treated to Date: 4
Plan Prescribed Dose Per Fraction: 2 Gy
Plan Total Fractions Prescribed: 4
Plan Total Prescribed Dose: 8 Gy
Reference Point Dosage Given to Date: 50.56 Gy
Reference Point Session Dosage Given: 2 Gy
Session Number: 20

## 2022-08-11 NOTE — Progress Notes (Signed)
Patient Care Team: Cari Caraway, MD as PCP - General (Family Medicine) Ernst Bowler Gwenith Daily, MD as Consulting Physician (Allergy and Immunology) Jovita Kussmaul, MD as Consulting Physician (General Surgery) Rockwell Germany, RN as Oncology Nurse Navigator Mauro Kaufmann, RN as Oncology Nurse Navigator Nicholas Lose, MD as Consulting Physician (Hematology and Oncology)  DIAGNOSIS: No diagnosis found.  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 pT2, pN0   07/14/2022 - 08/11/2022 Radiation Therapy       CHIEF COMPLIANT: Follow-up post op  INTERVAL HISTORY: Angela Lin is a 61 y.o she presents to the clinic today for a follow-up after Xrt.   ALLERGIES:  has No Known Allergies.  MEDICATIONS:  Current Outpatient Medications  Medication Sig Dispense Refill   amLODipine (NORVASC) 5 MG tablet Take 5 mg by mouth daily.     aspirin 81 MG chewable tablet Chew 81 mg by mouth daily.     Biotin w/ Vitamins C & E (HAIR/SKIN/NAILS PO) Take 1 tablet by mouth daily.     Cholecalciferol (VITAMIN D3 PO) Take by mouth.     diphenhydrAMINE (BENADRYL) 25 mg capsule Take 25 mg by mouth at bedtime.     Glucosamine HCl-MSM (GLUCOSAMINE-MSM PO) Take 2 tablets by mouth daily.     losartan (COZAAR) 100 MG tablet Take 100 mg by mouth daily.     Metoprolol Tartrate (FIRST - METOPROLOL PO) Take by mouth daily.     milk thistle 175 MG tablet Take 175 mg by mouth 2 (two) times daily.      naproxen sodium (ALEVE) 220 MG tablet Take 220 mg by mouth daily as needed.     tetrahydrozoline  0.05 % ophthalmic solution Place 2 drops into both eyes daily.     No current facility-administered medications for this visit.    PHYSICAL EXAMINATION: ECOG PERFORMANCE STATUS: {CHL ONC ECOG PS:[id number]}  There were no vitals filed for this visit. There were no vitals filed for this visit.  BREAST:*** No palpable masses or nodules in either right or left breasts. No palpable axillary supraclavicular or infraclavicular adenopathy no breast tenderness or nipple discharge. (exam performed in the presence of a chaperone)  LABORATORY DATA:  I have reviewed the data as listed    Latest Ref Rng & Units 12/09/2018    4:07 AM 06/05/2016    4:00 AM  CMP  Glucose 70 - 99 mg/dL 153  141   BUN 6 - 20 mg/dL 9  8   Creatinine 0.44 - 1.00 mg/dL 0.68  0.62   Sodium 135 - 145 mmol/L 133  137   Potassium 3.5 - 5.1 mmol/L 3.8  4.2   Chloride 98 - 111 mmol/L 100  103   CO2 22 - 32 mmol/L 25  28   Calcium 8.9 - 10.3 mg/dL 8.7  9.2     Lab Results  Component Value Date   WBC 10.7 (H) 12/09/2018   HGB 13.1 12/09/2018   HCT 38.8 12/09/2018   MCV 92.4 12/09/2018   PLT 218 12/09/2018    ASSESSMENT & PLAN:  No problem-specific Assessment &  Plan notes found for this encounter.    No orders of the defined types were placed in this encounter.  The patient has a good understanding of the overall plan. she agrees with it. she will call with any problems that may develop before the next visit here. Total time spent: 30 mins including face to face time and time spent for planning, charting and co-ordination of care   Suzzette Righter, [place] 08/11/22    I Gardiner Coins am scribing for Dr. Lindi Adie  ***

## 2022-08-12 ENCOUNTER — Inpatient Hospital Stay: Payer: 59 | Attending: Hematology and Oncology | Admitting: Hematology and Oncology

## 2022-08-12 DIAGNOSIS — Z79811 Long term (current) use of aromatase inhibitors: Secondary | ICD-10-CM | POA: Insufficient documentation

## 2022-08-12 DIAGNOSIS — Z79899 Other long term (current) drug therapy: Secondary | ICD-10-CM | POA: Diagnosis not present

## 2022-08-12 DIAGNOSIS — C7A8 Other malignant neuroendocrine tumors: Secondary | ICD-10-CM | POA: Diagnosis present

## 2022-08-12 DIAGNOSIS — Z7982 Long term (current) use of aspirin: Secondary | ICD-10-CM | POA: Insufficient documentation

## 2022-08-12 DIAGNOSIS — Z923 Personal history of irradiation: Secondary | ICD-10-CM | POA: Diagnosis not present

## 2022-08-12 MED ORDER — LETROZOLE 2.5 MG PO TABS
2.5000 mg | ORAL_TABLET | Freq: Every day | ORAL | 2 refills | Status: DC
  Filled 2023-01-07: qty 90, 90d supply, fill #0
  Filled 2023-04-02: qty 90, 90d supply, fill #1
  Filled 2023-07-27: qty 30, 30d supply, fill #2

## 2022-08-12 NOTE — Progress Notes (Signed)
                                                                                                                                                             Patient Name: OPHIA SHAMOON MRN: [id number] DOB: 27-Aug-1961 Referring Physician: Cari Caraway (Profile Not Attached) Date of Service: 08/11/2022 [facility], [place]                                                        End Of Treatment Note  Diagnoses: C50.419-Malignant neoplasm of upper-outer quadrant of unspecified female breast  Cancer Staging:    Stage IA, pT2N0M0, ER/PR positive neuroendocrine tumor of the left breast  Intent: Curative  Radiation Treatment Dates: 07/15/2022 through 08/11/2022 Site Technique Total Dose (Gy) Dose per Fx (Gy) Completed Fx Beam Energies  Breast, Left: Breast_L 3D 42.56/42.56 2.66 16/16 10XFFF  Breast, Left: Breast_L_Bst 3D 8/8 2 4/4 6X, 10X   Narrative: The patient tolerated radiation therapy relatively well. She developed fatigue and anticipated skin changes in the treatment field.   Plan: The patient will receive a call in about one month from the radiation oncology department. She will continue follow up with Dr. Lindi Adie as well.   ________________________________________________    Carola Rhine, [facility]

## 2022-08-12 NOTE — Assessment & Plan Note (Signed)
05/26/22: Left Lumpectomy: NET 2.8 cm, margins Neg, 0./2LN Skin not involved Er 90%, PR 90%, Ki 67: 8% 07/14/2022-08/11/2022: Adjuvant radiation  Treatment plan: Adjuvant antiestrogen therapy with letrozole 2.5 mg daily starting 08/27/2022  Letrozole counseling: We discussed the risks and benefits of anti-estrogen therapy with aromatase inhibitors. These include but not limited to insomnia, hot flashes, mood changes, vaginal dryness, bone density loss, and weight gain. We strongly believe that the benefits far outweigh the risks. Patient understands these risks and consented to starting treatment. Planned treatment duration is 7 years.  Return to clinic in 3 months for survivorship care plan visit

## 2022-08-17 ENCOUNTER — Ambulatory Visit (HOSPITAL_BASED_OUTPATIENT_CLINIC_OR_DEPARTMENT_OTHER)
Admission: RE | Admit: 2022-08-17 | Discharge: 2022-08-17 | Disposition: A | Discharge status: Home / Self Care | Payer: 59 | Source: Ambulatory Visit | Attending: Hematology and Oncology | Admitting: Hematology and Oncology

## 2022-08-17 DIAGNOSIS — C7A8 Other malignant neuroendocrine tumors: Secondary | ICD-10-CM | POA: Insufficient documentation

## 2022-08-17 DIAGNOSIS — Z780 Asymptomatic menopausal state: Secondary | ICD-10-CM | POA: Insufficient documentation

## 2022-08-17 DIAGNOSIS — Z96643 Presence of artificial hip joint, bilateral: Secondary | ICD-10-CM | POA: Diagnosis not present

## 2022-08-17 DIAGNOSIS — Z13820 Encounter for screening for osteoporosis: Secondary | ICD-10-CM | POA: Insufficient documentation

## 2022-08-20 ENCOUNTER — Telehealth: Payer: Self-pay

## 2022-08-20 NOTE — Telephone Encounter (Signed)
Called pt to make her aware of results per MD. Pt understands and verbalizes she does take a calcium and vitD supplement, and will implement wb exercise into her daily routine. She knows to call with any further questions or concerns.

## 2022-08-20 NOTE — Telephone Encounter (Signed)
-----   Message from Nicholas Lose, MD sent at 08/17/2022  4:31 PM EDT ----- Please let her know that the bone density is normal thx ----- Message ----- From: Interface, Rad Results In Sent: 08/17/2022   8:37 AM EDT To: Nicholas Lose, MD

## 2022-09-06 ENCOUNTER — Ambulatory Visit: Payer: 59 | Attending: General Surgery

## 2022-09-06 VITALS — Wt 167.2 lb

## 2022-09-06 DIAGNOSIS — Z483 Aftercare following surgery for neoplasm: Secondary | ICD-10-CM | POA: Insufficient documentation

## 2022-09-06 DIAGNOSIS — C7A8 Other malignant neuroendocrine tumors: Secondary | ICD-10-CM | POA: Insufficient documentation

## 2022-09-06 NOTE — Therapy (Signed)
  OUTPATIENT PHYSICAL THERAPY SOZO SCREENING NOTE   Patient Name: Angela Lin MRN: [id number] DOB:07-25-1961, 61 y.o., female Today's Date: 09/06/2022  PCP: Cari Caraway, MD REFERRING PROVIDER: Jovita Kussmaul, MD   PT End of Session - 09/06/22 [phone]     Visit Number 1   # unchanged due to screen only   PT Start Time 0816    PT Stop Time 0821    PT Time Calculation (min) 5 min    Activity Tolerance Patient tolerated treatment well    Behavior During Therapy [facility] for tasks assessed/performed             Past Medical History:  Diagnosis Date   Arthritis    osteoarthritis- hip   Endometriosis    Heart murmur    as a child, reports as benign    Hypertension    Past Surgical History:  Procedure Laterality Date   BREAST BIOPSY Left 03/22/2022   lt 12:00 venus clip path pending   BREAST LUMPECTOMY Left 05/26/2022   Procedure: BREAST LUMPECTOMY WITH EXCISION OF SENTINEL NODE;  Surgeon: Jovita Kussmaul, MD;  Location: [place];  Service: General;  Laterality: Left;  GEN & PEC BLOCK   DIAGNOSTIC LAPAROSCOPY     endometriosis- 20 yrs ago   TOTAL HIP ARTHROPLASTY Right 06/04/2016   Procedure: RIGHT TOTAL HIP ARTHROPLASTY ANTERIOR APPROACH;  Surgeon: Mcarthur Rossetti, MD;  Location: [facility];  Service: Orthopedics;  Laterality: Right;   TOTAL HIP ARTHROPLASTY Left 12/08/2018   Procedure: LEFT TOTAL HIP ARTHROPLASTY ANTERIOR APPROACH;  Surgeon: Mcarthur Rossetti, MD;  Location: [facility];  Service: Orthopedics;  Laterality: Left;   Patient Active Problem List   Diagnosis Date Noted   Malignant neoplasm of upper outer quadrant of female breast ([place]) 06/23/2022   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 It measures 2.6 cm and is located in the upper outer quadrant. It is ER+,PR+, HER 2- with a Ki67 of 8%. She is pending a left lumpectomy with SLNB on 05/26/2022  PRECAUTIONS: left UE Lymphedema risk, None  SUBJECTIVE: Pt returns for her first 3 month L-Dex screen.   PAIN:  Are you having pain? No  SOZO SCREENING: Patient was assessed today using the SOZO machine to determine the lymphedema index score. This was compared to her baseline score. It was determined that she is within the recommended range when compared to her baseline and no further action is needed at this time. She will continue SOZO screenings. These are done every 3 months for 2 years post operatively followed by every 6 months for 2 years, and then annually.    Otelia Limes, PTA 09/06/2022, 8:46 AM

## 2022-09-21 ENCOUNTER — Ambulatory Visit
Admission: RE | Admit: 2022-09-21 | Discharge: 2022-09-21 | Disposition: A | Discharge status: Home / Self Care | Payer: 59 | Source: Ambulatory Visit | Attending: Radiation Oncology | Admitting: Radiation Oncology

## 2022-11-16 ENCOUNTER — Encounter: Payer: Self-pay | Admitting: Adult Health

## 2022-11-19 ENCOUNTER — Encounter: Payer: Self-pay | Admitting: *Deleted

## 2022-11-23 ENCOUNTER — Telehealth: Payer: Self-pay | Admitting: Adult Health

## 2022-11-23 NOTE — Telephone Encounter (Signed)
Called patient per 11/24 in basket. Patient notified of new appointment time

## 2022-12-06 ENCOUNTER — Ambulatory Visit: Payer: 59

## 2022-12-14 ENCOUNTER — Telehealth: Payer: Self-pay | Admitting: Adult Health

## 2022-12-14 NOTE — Telephone Encounter (Signed)
Called patient to adjust time for upcoming appointment. Patient ok with change. Patient notified.

## 2023-01-07 ENCOUNTER — Other Ambulatory Visit (HOSPITAL_COMMUNITY): Payer: Self-pay

## 2023-01-07 MED ORDER — FLUTICASONE PROPIONATE 50 MCG/ACT NA SUSP
2.0000 | Freq: Every day | NASAL | 0 refills | Status: DC
Start: 2023-01-06 — End: 2023-03-23
  Filled 2023-01-07: qty 48, 90d supply, fill #0

## 2023-01-07 MED ORDER — LOSARTAN POTASSIUM 100 MG PO TABS
100.0000 mg | ORAL_TABLET | Freq: Every day | ORAL | 0 refills | Status: DC
Start: 2022-04-16 — End: 2023-03-23
  Filled 2023-01-07: qty 90, 90d supply, fill #0

## 2023-01-07 MED ORDER — METOPROLOL SUCCINATE ER 100 MG PO TB24
150.0000 mg | ORAL_TABLET | Freq: Every day | ORAL | 1 refills | Status: DC
Start: 2022-04-16 — End: 2024-04-17
  Filled 2023-01-07: qty 135, 90d supply, fill #0

## 2023-01-07 MED ORDER — AMLODIPINE BESYLATE 5 MG PO TABS
5.0000 mg | ORAL_TABLET | Freq: Every day | ORAL | 3 refills | Status: DC
  Filled 2023-01-07 – 2023-04-02 (×3): qty 90, 90d supply, fill #0

## 2023-01-11 ENCOUNTER — Inpatient Hospital Stay: Payer: 59 | Attending: Adult Health | Admitting: Adult Health

## 2023-01-11 ENCOUNTER — Encounter: Payer: Self-pay | Admitting: Adult Health

## 2023-01-11 ENCOUNTER — Telehealth: Payer: Self-pay | Admitting: *Deleted

## 2023-01-11 ENCOUNTER — Telehealth: Payer: Self-pay | Admitting: Adult Health

## 2023-01-11 DIAGNOSIS — C50412 Malignant neoplasm of upper-outer quadrant of left female breast: Secondary | ICD-10-CM | POA: Diagnosis not present

## 2023-01-11 DIAGNOSIS — Z170 Estrogen receptor positive status [ER+]: Secondary | ICD-10-CM | POA: Diagnosis not present

## 2023-01-11 NOTE — Telephone Encounter (Signed)
Referral faxed to [facility] referral coordinator at [phone] Fax confirmation received

## 2023-01-11 NOTE — Progress Notes (Signed)
SURVIVORSHIP VIRTUAL VISIT:  I connected with Angela Lin on 01/11/23 at  1:45 PM EST by telephone and verified that I am speaking with the correct person using two identifiers.  I discussed the limitations, risks, security and privacy concerns of performing an evaluation and management service by telephone and the availability of in person appointments. I also discussed with the patient that there may be a patient responsible charge related to this service. The patient expressed understanding and agreed to proceed.   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 pT2, pN0   05/26/2022 Cancer Staging   Staging form: Breast, AJCC 8th Edition - Clinical stage from 05/26/2022: Stage IIA (cT2, cN1, cM0, G2, ER+, PR+, HER2-) - Signed by Gardenia Phlegm, NP on 01/11/2023 Stage prefix: Initial diagnosis Histologic grading system: 3 grade system   07/15/2022 - 08/11/2022 Radiation Therapy    07/15/2022 through 08/11/2022 Site Technique Total Dose (Gy) Dose per Fx (Gy) Completed Fx Beam Energies  Breast, Left: Breast_L 3D 42.56/42.56 2.66 16/16 10XFFF  Breast, Left: Breast_L_Bst 3D 8/8 2 4/4 6X, 10X    07/2022 -  Anti-estrogen oral therapy   Letrozole daily     INTERVAL HISTORY:  Angela Lin to review her survivorship care plan detailing her treatment course for breast cancer, as well as monitoring  long-term side effects of that treatment, education regarding health maintenance, screening, and overall wellness and health promotion.     Overall, Angela Lin reports feeling quite well.  She is taking Letrozole daily with good tolerance.  She denies any hot flashes, vaginal dryness or arthralgias.  She is a current every day smoker.  She is a current every day smoker with 20 pack year history.  She has been recommended to undergo lung cancer screening, and has declined in the past.    REVIEW OF SYSTEMS:  Review of Systems  Constitutional:  Negative for appetite change, chills, fatigue, fever and unexpected weight change.  HENT:   Negative for hearing loss, lump/mass and trouble swallowing.   Eyes:  Negative for eye problems and icterus.  Respiratory:  Negative for chest tightness, cough and shortness of breath.   Cardiovascular:  Negative for chest pain, leg swelling and palpitations.  Gastrointestinal:  Negative for abdominal distention, abdominal pain, constipation, diarrhea, nausea and vomiting.  Endocrine: Negative for hot flashes.  Genitourinary:  Negative for difficulty urinating.   Musculoskeletal:  Negative for arthralgias.  Skin:  Negative for itching and rash.  Neurological:  Negative for dizziness, extremity weakness, headaches and numbness.  Hematological:  Negative for adenopathy. Does not bruise/bleed easily.  Psychiatric/Behavioral:  Negative for depression. The patient is not nervous/anxious.   Breast: Denies any new nodularity, masses, tenderness, nipple changes, or nipple discharge.       PAST MEDICAL/SURGICAL HISTORY:  Past Medical History:  Diagnosis Date   Arthritis    osteoarthritis- hip   Endometriosis    Heart murmur    as  a child, reports as benign    Hypertension    Past Surgical History:  Procedure Laterality Date   BREAST BIOPSY Left 03/22/2022   lt 12:00 venus clip path pending   BREAST LUMPECTOMY Left 05/26/2022   Procedure: BREAST LUMPECTOMY WITH  EXCISION OF SENTINEL NODE;  Surgeon: Angela Kussmaul, MD;  Location: [place];  Service: General;  Laterality: Left;  GEN & PEC BLOCK   DIAGNOSTIC LAPAROSCOPY     endometriosis- 20 yrs ago   TOTAL HIP ARTHROPLASTY Right 06/04/2016   Procedure: RIGHT TOTAL HIP ARTHROPLASTY ANTERIOR APPROACH;  Surgeon: Mcarthur Rossetti, MD;  [facility];  Service: Orthopedics;  Laterality: Right;   TOTAL HIP ARTHROPLASTY Left 12/08/2018   Procedure: LEFT TOTAL HIP ARTHROPLASTY ANTERIOR APPROACH;  Surgeon: Mcarthur Rossetti, MD;  Location: [facility];  Service: Orthopedics;  Laterality: Left;     ALLERGIES:  Allergies  Allergen Reactions   Hydrochlorothiazide Other (See Comments)     CURRENT MEDICATIONS:  Outpatient Encounter Medications as of 01/11/2023  Medication Sig   amLODipine (NORVASC) 5 MG tablet Take 1 tablet (5 mg total) by mouth daily.   aspirin 81 MG chewable tablet Chew 81 mg by mouth daily.   Biotin w/ Vitamins C & E (HAIR/SKIN/NAILS PO) Take 1 tablet by mouth daily.   Cholecalciferol (VITAMIN D3 PO) Take by mouth.   diphenhydrAMINE (BENADRYL) 25 mg capsule Take 25 mg by mouth at bedtime.   fluticasone (FLONASE) 50 MCG/ACT nasal spray Place 2 sprays into both nostrils daily.   Glucosamine HCl-MSM (GLUCOSAMINE-MSM PO) Take 2 tablets by mouth daily.   letrozole (FEMARA) 2.5 MG tablet Take 1 tablet (2.5 mg total) by mouth daily.   losartan (COZAAR) 100 MG tablet Take 100 mg by mouth daily.   losartan (COZAAR) 100 MG tablet Take 1 tablet (100 mg total) by mouth daily.   metoprolol succinate (TOPROL-XL) 100 MG 24 hr tablet Take 1.5 tablets (150 mg total) by mouth daily.   Metoprolol Tartrate (FIRST - METOPROLOL PO) Take by mouth daily.   milk thistle 175 MG tablet Take 175 mg by mouth 2 (two) times daily.    naproxen sodium (ALEVE) 220 MG tablet Take 220 mg by mouth daily as needed.   tetrahydrozoline 0.05 % ophthalmic solution Place 2 drops into both eyes daily.    [DISCONTINUED] amLODipine (NORVASC) 5 MG tablet Take 5 mg by mouth daily.   No facility-administered encounter medications on file as of 01/11/2023.     ONCOLOGIC FAMILY HISTORY:  Family History  Problem Relation Age of Onset   Cancer Father        Lung cancer- passed 2005    Hypertension Brother      SOCIAL HISTORY:  Social History   Socioeconomic History   Marital status: Divorced    Spouse name: Not on file   Number of children: Not on file   Years of education: Not on file   Highest education level: Not on file  Occupational History   Not on file  Tobacco Use   Smoking status: Every Day    Packs/day: 1.00    Years: 20.00    Total pack years: 20.00    Types: Cigarettes   Smokeless tobacco: Never   Tobacco comments:    past was able to quit- now smoking 0.5 ppd  Vaping Use   Vaping Use: Some days  Substance and Sexual Activity   Alcohol use: Yes    Comment: 2-3 times weekly  Drug use: No   Sexual activity: Not Currently    Birth control/protection: Post-menopausal  Other Topics Concern   Not on file  Social History Narrative   Not on file   Social Determinants of Health   Financial Resource Strain: Not on file  Food Insecurity: Not on file  Transportation Needs: Not on file  Physical Activity: Not on file  Stress: Not on file  Social Connections: Not on file  Intimate Partner Violence: Not on file     OBSERVATIONS/OBJECTIVE:   LABORATORY DATA:  None for this visit.  DIAGNOSTIC IMAGING:  None for this visit.      ASSESSMENT AND PLAN:  Ms.. Wehrli is a pleasant 62 y.o. female with Stage IIA left breast invasive ductal carcinoma, ER+/PR+/HER2-, diagnosed in 03/2022, treated with lumpectomy, adjuvant radiation therapy, and anti-estrogen therapy with Letrozole beginning in 07/2022.  She presents to the Survivorship Clinic for our initial meeting and routine follow-up post-completion of treatment for breast cancer.    1. Stage IIA left breast cancer:   Ms. Remick is continuing to recover from definitive treatment for breast cancer. She will follow-up with her medical oncologist, Dr. Lindi Adie in 6 months with history and physical exam per surveillance protocol.  She will continue her anti-estrogen therapy with Letrozole. Thus far, she is tolerating the Letrozole well, with minimal side effects. She was instructed to make Dr. Lindi Adie or myself aware if she begins to experience any worsening side effects of the medication and I could see her back in clinic to help manage those side effects, as needed. Her mammogram is due 02/2023; orders placed today. Today, a comprehensive survivorship care plan and treatment summary was reviewed with the patient today detailing her breast cancer diagnosis, treatment course, potential late/long-term effects of treatment, appropriate follow-up care with recommendations for the future, and patient education resources.  A copy of this summary, along with a letter will be sent to the patient's primary care provider via mail/fax/In Basket message after today's visit.    2. Bone health:  Given Ms. Gambill's age/history of breast cancer and her current treatment regimen including anti-estrogen therapy with Letrozole, she is at risk for bone demineralization.  Her last DEXA scan was 08/17/2022 which was normal.  Repeat is recommended in 07/2024.  She was given education on specific activities to promote bone health.  3. Cancer screening:  Due to Ms. Caillier's history and her age, she should receive screening for skin cancers, lung cancer, colon cancer, and gynecologic cancers.  The information and recommendations are listed on the patient's comprehensive care plan/treatment summary and were reviewed in detail with the patient.  She declined referral for many cancer screening test that however agreed with a dermatology referral.  4. Health maintenance and wellness promotion: Ms. Christine was encouraged to consume 5-7 servings of fruits and  vegetables per day. We reviewed the "Nutrition Rainbow" handout.  She was also encouraged to engage in moderate to vigorous exercise for 30 minutes per day most days of the week. We discussed the [employer] fitness program, which is designed for cancer survivors to help them become more physically fit after cancer treatments.  She was instructed to limit her alcohol consumption and was encouraged stop smoking.     5. Support services/counseling: It is not uncommon for this period of the patient's cancer care trajectory to be one of many emotions and stressors. She was given information regarding our available services and encouraged to contact me with any questions or for  help enrolling in any of our support group/programs.    Follow up instructions:    -Return to cancer center in 6 months for follow-up with Dr. Lindi Adie -Mammogram due in 02/2023 -Bone density testing due in August 2025 -She is welcome to return back to the Survivorship Clinic at any time; no additional follow-up needed at this time.  -Consider referral back to survivorship as a long-term survivor for continued surveillance  The patient was provided an opportunity to ask questions and all were answered. The patient agreed with the plan and demonstrated an understanding of the instructions.   The patient was advised to call back or seek an in-person evaluation if the symptoms worsen or if the condition fails to improve as anticipated.   I provided 25 minutes of non face-to-face telephone visit time during this encounter, and > 50% was spent counseling as documented under my assessment & plan.  Wilber Bihari, NP 01/17/23 9:27 AM Medical Oncology and Hematology [facility] [place], [place] [address] Tel. [phone]    Fax. [phone]

## 2023-01-12 ENCOUNTER — Telehealth: Payer: Self-pay | Admitting: Hematology and Oncology

## 2023-01-12 NOTE — Telephone Encounter (Signed)
Scheduled appointment per 1/16 los. Patient is aware.

## 2023-01-17 ENCOUNTER — Ambulatory Visit: Payer: 59 | Attending: General Surgery

## 2023-01-17 VITALS — Wt 171.2 lb

## 2023-01-17 DIAGNOSIS — Z483 Aftercare following surgery for neoplasm: Secondary | ICD-10-CM | POA: Insufficient documentation

## 2023-01-17 NOTE — Therapy (Signed)
OUTPATIENT PHYSICAL THERAPY SOZO SCREENING NOTE   Patient Name: Angela Lin MRN: [id number] DOB:12/20/1961, 63 y.o., female Today's Date: 01/17/2023  PCP: Cari Caraway, MD REFERRING PROVIDER: Jovita Kussmaul, MD   PT End of Session - 01/17/23 1653     Visit Number 1   # unchanged due to screen only   PT Start Time 1651    PT Stop Time 1656    PT Time Calculation (min) 5 min    Activity Tolerance Patient tolerated treatment well    Behavior During Therapy [facility] for tasks assessed/performed             Past Medical History:  Diagnosis Date   Arthritis    osteoarthritis- hip   Endometriosis    Heart murmur    as a child, reports as benign    Hypertension    Past Surgical History:  Procedure Laterality Date   BREAST BIOPSY Left 03/22/2022   lt 12:00 venus clip path pending   BREAST LUMPECTOMY Left 05/26/2022   Procedure: BREAST LUMPECTOMY WITH EXCISION OF SENTINEL NODE;  Surgeon: Jovita Kussmaul, MD;  Location: [place];  Service: General;  Laterality: Left;  GEN & PEC BLOCK   DIAGNOSTIC LAPAROSCOPY     endometriosis- 20 yrs ago   TOTAL HIP ARTHROPLASTY Right 06/04/2016   Procedure: RIGHT TOTAL HIP ARTHROPLASTY ANTERIOR APPROACH;  Surgeon: Mcarthur Rossetti, MD;  [facility];  Service: Orthopedics;  Laterality: Right;   TOTAL HIP ARTHROPLASTY Left 12/08/2018   Procedure: LEFT TOTAL HIP ARTHROPLASTY ANTERIOR APPROACH;  Surgeon: Mcarthur Rossetti, MD;  Location[facility];  Service: Orthopedics;  Laterality: Left;   Patient Active Problem List   Diagnosis Date Noted   Malignant neoplasm of upper outer quadrant of female breast ([place]) 06/23/2022   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 It measures 2.6 cm and is located in the upper outer quadrant. It is ER+,PR+, HER 2- with a Ki67 of 8%. She is pending a left lumpectomy with SLNB on 05/26/2022  PRECAUTIONS: left UE Lymphedema risk, None  SUBJECTIVE: Pt returns for her first 3 month L-Dex screen.   PAIN:  Are you having pain? No  SOZO SCREENING: Patient was assessed today using the SOZO machine to determine the lymphedema index score. This was compared to her baseline score. It was determined that she is within the recommended range when compared to her baseline and no further action is needed at this time. She will continue SOZO screenings. These are done every 3 months for 2 years post operatively followed by every 6 months for 2 years, and then annually.   L-DEX FLOWSHEETS - 01/17/23 1600       L-DEX LYMPHEDEMA SCREENING   Measurement Type Unilateral    L-DEX MEASUREMENT EXTREMITY Upper Extremity    POSITION  Standing    DOMINANT SIDE Right    At Risk Side Left    BASELINE SCORE (UNILATERAL) 0.2    L-DEX SCORE (UNILATERAL) -0.1    VALUE CHANGE (  UNILAT) -0.3              Otelia Limes, PTA 01/17/2023, 4:55 PM

## 2023-01-31 ENCOUNTER — Other Ambulatory Visit (HOSPITAL_COMMUNITY): Payer: Self-pay

## 2023-01-31 MED ORDER — AMLODIPINE BESYLATE 5 MG PO TABS
ORAL_TABLET | ORAL | 0 refills | Status: DC
Start: 2023-01-31 — End: 2023-05-16
  Filled 2023-01-31: qty 90, 90d supply, fill #0

## 2023-02-07 ENCOUNTER — Other Ambulatory Visit: Payer: Self-pay

## 2023-02-07 ENCOUNTER — Other Ambulatory Visit (HOSPITAL_COMMUNITY): Payer: Self-pay

## 2023-02-07 MED ORDER — METOPROLOL SUCCINATE ER 100 MG PO TB24
150.0000 mg | ORAL_TABLET | Freq: Every day | ORAL | 0 refills | Status: DC
  Filled 2023-02-07: qty 135, 90d supply, fill #0

## 2023-03-11 DIAGNOSIS — I10 Essential (primary) hypertension: Secondary | ICD-10-CM | POA: Diagnosis not present

## 2023-03-11 DIAGNOSIS — E673 Hypervitaminosis D: Secondary | ICD-10-CM | POA: Diagnosis not present

## 2023-03-11 DIAGNOSIS — E781 Pure hyperglyceridemia: Secondary | ICD-10-CM | POA: Diagnosis not present

## 2023-03-22 ENCOUNTER — Other Ambulatory Visit (HOSPITAL_COMMUNITY): Payer: Self-pay

## 2023-03-23 ENCOUNTER — Other Ambulatory Visit (HOSPITAL_COMMUNITY): Payer: Self-pay

## 2023-03-23 DIAGNOSIS — E7800 Pure hypercholesterolemia, unspecified: Secondary | ICD-10-CM | POA: Diagnosis not present

## 2023-03-23 DIAGNOSIS — Z1211 Encounter for screening for malignant neoplasm of colon: Secondary | ICD-10-CM | POA: Diagnosis not present

## 2023-03-23 DIAGNOSIS — E559 Vitamin D deficiency, unspecified: Secondary | ICD-10-CM | POA: Diagnosis not present

## 2023-03-23 DIAGNOSIS — F17210 Nicotine dependence, cigarettes, uncomplicated: Secondary | ICD-10-CM | POA: Diagnosis not present

## 2023-03-23 DIAGNOSIS — C50912 Malignant neoplasm of unspecified site of left female breast: Secondary | ICD-10-CM | POA: Diagnosis not present

## 2023-03-23 DIAGNOSIS — I10 Essential (primary) hypertension: Secondary | ICD-10-CM | POA: Diagnosis not present

## 2023-03-23 DIAGNOSIS — Z6829 Body mass index (BMI) 29.0-29.9, adult: Secondary | ICD-10-CM | POA: Diagnosis not present

## 2023-03-23 DIAGNOSIS — J301 Allergic rhinitis due to pollen: Secondary | ICD-10-CM | POA: Diagnosis not present

## 2023-03-23 MED ORDER — VARENICLINE TARTRATE (STARTER) 0.5 MG X 11 & 1 MG X 42 PO TBPK
ORAL_TABLET | ORAL | 0 refills | Status: DC
  Filled 2023-03-23 – 2023-04-02 (×2): qty 53, 28d supply, fill #0

## 2023-03-23 MED ORDER — FLUTICASONE PROPIONATE 50 MCG/ACT NA SUSP
2.0000 | Freq: Every day | NASAL | 3 refills | Status: DC
  Filled 2023-04-02: qty 48, 90d supply, fill #0
  Filled 2023-10-24: qty 48, 90d supply, fill #1

## 2023-03-23 MED ORDER — AMLODIPINE BESYLATE 5 MG PO TABS
5.0000 mg | ORAL_TABLET | Freq: Every day | ORAL | 3 refills | Status: DC
Start: 2023-03-23 — End: 2024-04-18
  Filled 2023-03-23 – 2023-04-24 (×2): qty 90, 90d supply, fill #0
  Filled 2023-05-01 – 2023-07-27 (×2): qty 90, 90d supply, fill #1
  Filled 2023-10-24: qty 90, 90d supply, fill #2
  Filled 2023-12-30 – 2024-01-20 (×2): qty 90, 90d supply, fill #3

## 2023-03-23 MED ORDER — LOSARTAN POTASSIUM 100 MG PO TABS
100.0000 mg | ORAL_TABLET | Freq: Every day | ORAL | 3 refills | Status: DC
  Filled 2023-05-01: qty 90, 90d supply, fill #0
  Filled 2023-07-27: qty 90, 90d supply, fill #1
  Filled 2023-10-24: qty 90, 90d supply, fill #2
  Filled 2023-12-30: qty 90, 90d supply, fill #3

## 2023-03-23 MED ORDER — METOPROLOL SUCCINATE ER 100 MG PO TB24
150.0000 mg | ORAL_TABLET | Freq: Every day | ORAL | 3 refills | Status: DC
  Filled 2023-05-01: qty 135, 90d supply, fill #0

## 2023-03-23 MED ORDER — VARENICLINE TARTRATE(CONTINUE) 1 MG PO TABS
1.0000 | ORAL_TABLET | Freq: Two times a day (BID) | ORAL | 1 refills | Status: DC
Start: 2023-03-23 — End: 2024-05-23
  Filled 2023-05-01: qty 180, 90d supply, fill #0

## 2023-03-24 ENCOUNTER — Other Ambulatory Visit (HOSPITAL_COMMUNITY): Payer: Self-pay

## 2023-03-25 ENCOUNTER — Other Ambulatory Visit (HOSPITAL_COMMUNITY): Payer: Self-pay

## 2023-04-01 ENCOUNTER — Other Ambulatory Visit (HOSPITAL_COMMUNITY): Payer: Self-pay

## 2023-04-02 ENCOUNTER — Other Ambulatory Visit (HOSPITAL_COMMUNITY): Payer: Self-pay

## 2023-04-18 ENCOUNTER — Ambulatory Visit: Payer: 59

## 2023-04-25 ENCOUNTER — Other Ambulatory Visit (HOSPITAL_COMMUNITY): Payer: Self-pay

## 2023-05-01 ENCOUNTER — Other Ambulatory Visit (HOSPITAL_COMMUNITY): Payer: Self-pay

## 2023-05-02 ENCOUNTER — Other Ambulatory Visit (HOSPITAL_COMMUNITY): Payer: Self-pay

## 2023-05-02 ENCOUNTER — Other Ambulatory Visit: Payer: Self-pay

## 2023-05-02 MED ORDER — METOPROLOL SUCCINATE ER 100 MG PO TB24
150.0000 mg | ORAL_TABLET | Freq: Every day | ORAL | 3 refills | Status: DC
  Filled 2023-05-02: qty 135, 90d supply, fill #0

## 2023-05-04 ENCOUNTER — Encounter: Payer: Self-pay | Admitting: Hematology and Oncology

## 2023-05-04 ENCOUNTER — Encounter: Payer: Self-pay | Admitting: *Deleted

## 2023-05-04 ENCOUNTER — Telehealth: Payer: Self-pay | Admitting: Hematology and Oncology

## 2023-05-04 NOTE — Progress Notes (Unsigned)
Received mychart message from pt with complaint of hair loss and joint pain while on Letrozole.  Pt requesting to stop the medication at this time.  Per MD pt to stop Letrozole for 2 weeks and f/u in office.  Scheduling message sent, pt notified.

## 2023-05-04 NOTE — Telephone Encounter (Signed)
Scheduled appointment per staff message. Left voicemail. 

## 2023-05-16 ENCOUNTER — Inpatient Hospital Stay: Payer: 59 | Attending: Adult Health | Admitting: Adult Health

## 2023-05-16 ENCOUNTER — Encounter: Payer: Self-pay | Admitting: Adult Health

## 2023-05-16 VITALS — BP 137/55 | HR 78 | Temp 97.5°F | Resp 16 | Wt 176.1 lb

## 2023-05-16 DIAGNOSIS — E7800 Pure hypercholesterolemia, unspecified: Secondary | ICD-10-CM | POA: Insufficient documentation

## 2023-05-16 DIAGNOSIS — C50412 Malignant neoplasm of upper-outer quadrant of left female breast: Secondary | ICD-10-CM | POA: Insufficient documentation

## 2023-05-16 DIAGNOSIS — Z170 Estrogen receptor positive status [ER+]: Secondary | ICD-10-CM | POA: Diagnosis not present

## 2023-05-16 DIAGNOSIS — Z79811 Long term (current) use of aromatase inhibitors: Secondary | ICD-10-CM | POA: Diagnosis not present

## 2023-05-16 DIAGNOSIS — F17210 Nicotine dependence, cigarettes, uncomplicated: Secondary | ICD-10-CM | POA: Insufficient documentation

## 2023-05-16 DIAGNOSIS — I10 Essential (primary) hypertension: Secondary | ICD-10-CM | POA: Insufficient documentation

## 2023-05-16 NOTE — Progress Notes (Signed)
[facility] Cancer Follow up:    Gweneth Dimitri, MD [address] [place] [address]      DIAGNOSIS:  Cancer Staging  Malignant neoplasm of upper outer quadrant of female breast [facility]) Staging form: Breast, AJCC 8th Edition - Clinical stage from 05/26/2022: Stage IIA (cT2, cN1, cM0, G2, ER+, PR+, HER2-) - Signed by Loa Socks, NP on 01/11/2023 Stage prefix: Initial diagnosis Histologic grading system: 3 grade system   SUMMARY OF ONCOLOGIC HISTORY: Oncology History  Other malignant neuroendocrine tumors ([facility])  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)  03/31/2022 Initial Diagnosis   03/22/2022: Left breast biopsy: Neuroendocrine tumor of the breast grade 2, with DCIS, ER >90% PR > 90% HER2 negative (GATA 3 positive, synaptophysin strong staining, chromogranin negative, Ki-67 8%, CD31 indeterminate)   05/26/2022 Surgery   Left Lumpectomy: NET 2.8 cm, margins Neg, 0./2LN Skin not involved Er 90%, PR 90%, Ki 67: 8%. pT2, pN0   05/26/2022 Cancer Staging   Staging form: Breast, AJCC 8th Edition - Clinical stage from 05/26/2022: Stage IIA (cT2, cN1, cM0, G2, ER+, PR+, HER2-) - Signed by Loa Socks, NP on 01/11/2023 Stage prefix: Initial diagnosis Histologic grading system: 3 grade system   07/15/2022 - 08/11/2022 Radiation Therapy    07/15/2022 through 08/11/2022 Site Technique Total Dose (Gy) Dose per Fx (Gy) Completed Fx Beam Energies  Breast, Left: Breast_L 3D 42.56/42.56 2.66 16/16 10XFFF  Breast, Left: Breast_L_Bst 3D 8/8 2 4/4 6X, 10X    07/2022 -  Anti-estrogen oral therapy   Letrozole daily     CURRENT THERAPY: Micheline Chapman  INTERVAL HISTORY: Donzetta Kohut Vater 62 y.o. female returns for f/u of her arthalgias on Letrozole.  She wants to know her risk of recurrence and if stopping the letrozole would help.  She has not stopped taking letrozole at this point and wants to talk it through with Dr. Pamelia Hoit and me  first.     Patient Active Problem List   Diagnosis Date Noted   Essential hypertension 05/16/2023   Elevated LDL cholesterol level 05/16/2023   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) 06/23/2022   Other malignant neuroendocrine tumors ([facility]) 03/31/2022   Unilateral primary osteoarthritis, left hip 12/08/2018   Status post total replacement of left hip 12/08/2018   Osteoarthritis of right hip 06/04/2016   Avascular necrosis of bones of both hips ([facility]) 06/04/2016   Status post total replacement of right hip 06/04/2016    is allergic to hydrochlorothiazide.  MEDICAL HISTORY: Past Medical History:  Diagnosis Date   Arthritis    osteoarthritis- hip   Endometriosis    Heart murmur    as a child, reports as benign    Hypertension     SURGICAL HISTORY: Past Surgical History:  Procedure Laterality Date   BREAST BIOPSY Left 03/22/2022   lt 12:00 venus clip path pending   BREAST LUMPECTOMY Left 05/26/2022   Procedure: BREAST LUMPECTOMY WITH EXCISION OF SENTINEL NODE;  Surgeon: Griselda Miner, MD;  Location: [facility];  Service: General;  Laterality: Left;  GEN & PEC BLOCK   DIAGNOSTIC LAPAROSCOPY     endometriosis- 20 yrs ago   TOTAL HIP ARTHROPLASTY Right 06/04/2016   Procedure: RIGHT TOTAL HIP ARTHROPLASTY ANTERIOR APPROACH;  Surgeon: Kathryne Hitch, MD;  [facility];  Service: Orthopedics;  Laterality: Right;   TOTAL HIP ARTHROPLASTY Left 12/08/2018   Procedure: LEFT TOTAL HIP ARTHROPLASTY ANTERIOR APPROACH;  Surgeon:  Kathryne Hitch, MD;  [facility];  Service: Orthopedics;  Laterality: Left;    SOCIAL HISTORY: Social History   Socioeconomic History   Marital status: Divorced    Spouse name: Not on file   Number of children: Not on file   Years of education: Not on file   Highest education level: Not on file  Occupational History   Not on file  Tobacco Use   Smoking status: Every Day    Packs/day: 1.00     Years: 20.00    Additional pack years: 0.00    Total pack years: 20.00    Types: Cigarettes   Smokeless tobacco: Never   Tobacco comments:    past was able to quit- now smoking 0.5 ppd  Vaping Use   Vaping Use: Some days  Substance and Sexual Activity   Alcohol use: Yes    Comment: 2-3 times weekly   Drug use: No   Sexual activity: Not Currently    Birth control/protection: Post-menopausal  Other Topics Concern   Not on file  Social History Narrative   Not on file   Social Determinants of Health   Financial Resource Strain: Not on file  Food Insecurity: Not on file  Transportation Needs: Not on file  Physical Activity: Not on file  Stress: Not on file  Social Connections: Not on file  Intimate Partner Violence: Not on file    FAMILY HISTORY: Family History  Problem Relation Age of Onset   Cancer Father        Lung cancer- passed 2005    Hypertension Brother     Review of Systems  Constitutional:  Negative for appetite change, chills, fatigue, fever and unexpected weight change.  HENT:   Negative for hearing loss, lump/mass and trouble swallowing.   Eyes:  Negative for eye problems and icterus.  Respiratory:  Negative for chest tightness, cough and shortness of breath.   Cardiovascular:  Negative for chest pain, leg swelling and palpitations.  Gastrointestinal:  Negative for abdominal distention, abdominal pain, constipation, diarrhea, nausea and vomiting.  Endocrine: Negative for hot flashes.  Genitourinary:  Negative for difficulty urinating.   Musculoskeletal:  Positive for arthralgias.  Skin:  Negative for itching and rash.  Neurological:  Negative for dizziness, extremity weakness, headaches and numbness.  Hematological:  Negative for adenopathy. Does not bruise/bleed easily.  Psychiatric/Behavioral:  Negative for depression. The patient is not nervous/anxious.   All other systems reviewed and are negative.     PHYSICAL EXAMINATION    Vitals:    05/16/23 1443  BP: (!) 137/55  Pulse: 78  Resp: 16  Temp: (!) 97.5 F (36.4 C)  SpO2: 99%   Exam deferred in lieu of counseling   LABORATORY DATA: None for this visit   ASSESSMENT and THERAPY PLAN:   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) Charnel is a 62 year old woman with stage IIa ER/PR positive left-sided neuroendocrine cancer.  She is status postlumpectomy, adjuvant radiation therapy, and antiestrogen therapy with anastrozole which began in August 2023.  Rhiannan is here for follow-up of her arthralgias.  We discussed risks and benefits of the antiestrogen therapy and risk of recurrence of stopping antiestrogen therapy.  We also discussed letrozole, exemestane, and tamoxifen.  After hearing all the details Kaeda has decided to stay on letrozole.  We discussed exercise and how it may help with her joint aches and pains since they are not happening every day.  She will work on this  and she returns in July for her routine follow-up with Dr. Pamelia Hoit.  She will touch base with him at that time about her joint aches and pains and whether she has noticed improvement with her activity changes.   All questions were answered. The patient knows to call the clinic with any problems, questions or concerns. We can certainly see the patient much sooner if necessary.  Total encounter time:20 minutes*in face-to-face visit time, chart review, lab review, care coordination, order entry, and documentation of the encounter time.    Lillard Anes, NP 05/16/23 3:36 PM Medical Oncology and Hematology [facility] [address] [place], [place] [address] Tel. [phone]    Fax. [phone]  *Total Encounter Time as defined by the Centers for Medicare and Medicaid Services includes, in addition to the face-to-face time of a patient visit (documented in the note above) non-face-to-face time: obtaining and reviewing outside history, ordering and reviewing  medications, tests or procedures, care coordination (communications with other health care professionals or caregivers) and documentation in the medical record.

## 2023-05-16 NOTE — Assessment & Plan Note (Signed)
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  She is status postlumpectomy, adjuvant radiation therapy, and antiestrogen therapy with anastrozole which began in August 2023.  Athziry is here for follow-up of her arthralgias.  We discussed risks and benefits of the antiestrogen therapy and risk of recurrence of stopping antiestrogen therapy.  We also discussed letrozole, exemestane, and tamoxifen.  After hearing all the details Tynnetta has decided to stay on letrozole.  We discussed exercise and how it may help with her joint aches and pains since they are not happening every day.  She will work on this and she returns in July for her routine follow-up with Dr. Pamelia Hoit.  She will touch base with him at that time about her joint aches and pains and whether she has noticed improvement with her activity changes.

## 2023-05-17 ENCOUNTER — Ambulatory Visit: Payer: 59 | Admitting: Adult Health

## 2023-06-26 ENCOUNTER — Other Ambulatory Visit (HOSPITAL_COMMUNITY): Payer: Self-pay

## 2023-06-27 ENCOUNTER — Other Ambulatory Visit (HOSPITAL_COMMUNITY): Payer: Self-pay

## 2023-06-28 ENCOUNTER — Other Ambulatory Visit (HOSPITAL_COMMUNITY): Payer: Self-pay

## 2023-06-28 MED ORDER — METOPROLOL SUCCINATE ER 100 MG PO TB24
150.0000 mg | ORAL_TABLET | Freq: Every day | ORAL | 2 refills | Status: DC
  Filled 2023-06-28 – 2023-07-27 (×2): qty 135, 90d supply, fill #0
  Filled 2023-10-24: qty 135, 90d supply, fill #1
  Filled 2023-12-30 – 2024-01-20 (×2): qty 135, 90d supply, fill #2

## 2023-07-06 ENCOUNTER — Other Ambulatory Visit (HOSPITAL_COMMUNITY): Payer: Self-pay

## 2023-07-06 MED ORDER — PEG 3350-KCL-NA BICARB-NACL 420 G PO SOLR
ORAL | 0 refills | Status: DC
  Filled 2023-07-06: qty 4000, 1d supply, fill #0

## 2023-07-08 ENCOUNTER — Other Ambulatory Visit (HOSPITAL_COMMUNITY): Payer: Self-pay

## 2023-07-19 ENCOUNTER — Telehealth: Payer: Self-pay | Admitting: Hematology and Oncology

## 2023-07-25 DIAGNOSIS — K635 Polyp of colon: Secondary | ICD-10-CM | POA: Diagnosis not present

## 2023-07-25 DIAGNOSIS — K648 Other hemorrhoids: Secondary | ICD-10-CM | POA: Diagnosis not present

## 2023-07-25 DIAGNOSIS — Z1211 Encounter for screening for malignant neoplasm of colon: Secondary | ICD-10-CM | POA: Diagnosis not present

## 2023-07-25 DIAGNOSIS — K5730 Diverticulosis of large intestine without perforation or abscess without bleeding: Secondary | ICD-10-CM | POA: Diagnosis not present

## 2023-07-26 ENCOUNTER — Ambulatory Visit: Payer: 59 | Admitting: Hematology and Oncology

## 2023-07-27 ENCOUNTER — Other Ambulatory Visit (HOSPITAL_COMMUNITY): Payer: Self-pay

## 2023-07-27 ENCOUNTER — Other Ambulatory Visit: Payer: Self-pay

## 2023-07-28 ENCOUNTER — Other Ambulatory Visit (HOSPITAL_COMMUNITY): Payer: Self-pay

## 2023-09-22 ENCOUNTER — Other Ambulatory Visit (HOSPITAL_COMMUNITY): Payer: Self-pay

## 2023-09-22 MED ORDER — COVID-19 MRNA VAC-TRIS(PFIZER) 30 MCG/0.3ML IM SUSY
0.3000 mL | PREFILLED_SYRINGE | Freq: Once | INTRAMUSCULAR | 0 refills | Status: AC
  Filled 2023-09-22: qty 0.3, 1d supply, fill #0

## 2023-09-22 MED ORDER — INFLUENZA VIRUS VACC SPLIT PF 0.5 ML IM SUSY
0.5000 mL | PREFILLED_SYRINGE | Freq: Once | INTRAMUSCULAR | 0 refills | Status: AC
Start: 2023-09-22 — End: 2023-09-23
  Filled 2023-09-22: qty 0.5, 1d supply, fill #0

## 2023-09-27 DIAGNOSIS — J301 Allergic rhinitis due to pollen: Secondary | ICD-10-CM | POA: Diagnosis not present

## 2023-09-27 DIAGNOSIS — Z124 Encounter for screening for malignant neoplasm of cervix: Secondary | ICD-10-CM | POA: Diagnosis not present

## 2023-09-27 DIAGNOSIS — F17210 Nicotine dependence, cigarettes, uncomplicated: Secondary | ICD-10-CM | POA: Diagnosis not present

## 2023-09-27 DIAGNOSIS — E559 Vitamin D deficiency, unspecified: Secondary | ICD-10-CM | POA: Diagnosis not present

## 2023-09-27 DIAGNOSIS — L989 Disorder of the skin and subcutaneous tissue, unspecified: Secondary | ICD-10-CM | POA: Diagnosis not present

## 2023-09-27 DIAGNOSIS — Z6828 Body mass index (BMI) 28.0-28.9, adult: Secondary | ICD-10-CM | POA: Diagnosis not present

## 2023-09-27 DIAGNOSIS — E7800 Pure hypercholesterolemia, unspecified: Secondary | ICD-10-CM | POA: Diagnosis not present

## 2023-09-27 DIAGNOSIS — I10 Essential (primary) hypertension: Secondary | ICD-10-CM | POA: Diagnosis not present

## 2023-09-30 DIAGNOSIS — E7800 Pure hypercholesterolemia, unspecified: Secondary | ICD-10-CM | POA: Diagnosis not present

## 2023-09-30 DIAGNOSIS — I10 Essential (primary) hypertension: Secondary | ICD-10-CM | POA: Diagnosis not present

## 2023-10-03 ENCOUNTER — Other Ambulatory Visit (HOSPITAL_COMMUNITY): Payer: Self-pay

## 2023-10-03 MED ORDER — ATORVASTATIN CALCIUM 10 MG PO TABS
10.0000 mg | ORAL_TABLET | Freq: Every day | ORAL | 3 refills | Status: DC
  Filled 2023-10-03: qty 30, 30d supply, fill #0
  Filled 2023-10-24 – 2023-10-31 (×2): qty 30, 30d supply, fill #1
  Filled 2023-11-30: qty 30, 30d supply, fill #2
  Filled 2023-12-30: qty 30, 30d supply, fill #3

## 2023-10-06 ENCOUNTER — Other Ambulatory Visit (HOSPITAL_COMMUNITY): Payer: Self-pay

## 2023-10-24 ENCOUNTER — Other Ambulatory Visit: Payer: Self-pay

## 2023-10-24 ENCOUNTER — Other Ambulatory Visit (HOSPITAL_COMMUNITY): Payer: Self-pay

## 2023-10-27 ENCOUNTER — Other Ambulatory Visit (HOSPITAL_COMMUNITY): Payer: Self-pay

## 2023-12-02 ENCOUNTER — Other Ambulatory Visit (HOSPITAL_COMMUNITY): Payer: Self-pay

## 2023-12-19 ENCOUNTER — Other Ambulatory Visit (HOSPITAL_COMMUNITY): Payer: Self-pay

## 2023-12-19 DIAGNOSIS — Z6828 Body mass index (BMI) 28.0-28.9, adult: Secondary | ICD-10-CM | POA: Diagnosis not present

## 2023-12-19 DIAGNOSIS — M898X1 Other specified disorders of bone, shoulder: Secondary | ICD-10-CM | POA: Diagnosis not present

## 2023-12-19 MED ORDER — TIZANIDINE HCL 4 MG PO TABS
4.0000 mg | ORAL_TABLET | Freq: Every evening | ORAL | 0 refills | Status: DC | PRN
  Filled 2023-12-19: qty 30, 30d supply, fill #0

## 2024-01-03 ENCOUNTER — Other Ambulatory Visit (HOSPITAL_COMMUNITY): Payer: Self-pay

## 2024-01-03 DIAGNOSIS — E7800 Pure hypercholesterolemia, unspecified: Secondary | ICD-10-CM | POA: Diagnosis not present

## 2024-01-03 MED ORDER — ATORVASTATIN CALCIUM 10 MG PO TABS
10.0000 mg | ORAL_TABLET | Freq: Every day | ORAL | 3 refills | Status: DC
  Filled 2024-01-20: qty 90, 90d supply, fill #0

## 2024-01-04 ENCOUNTER — Other Ambulatory Visit (HOSPITAL_COMMUNITY): Payer: Self-pay

## 2024-01-05 ENCOUNTER — Other Ambulatory Visit (HOSPITAL_COMMUNITY): Payer: Self-pay

## 2024-01-06 ENCOUNTER — Other Ambulatory Visit (HOSPITAL_COMMUNITY): Payer: Self-pay

## 2024-01-20 ENCOUNTER — Other Ambulatory Visit (HOSPITAL_COMMUNITY): Payer: Self-pay

## 2024-01-20 ENCOUNTER — Other Ambulatory Visit: Payer: Self-pay

## 2024-01-21 ENCOUNTER — Other Ambulatory Visit (HOSPITAL_COMMUNITY): Payer: Self-pay

## 2024-01-29 ENCOUNTER — Other Ambulatory Visit (HOSPITAL_COMMUNITY): Payer: Self-pay

## 2024-02-07 ENCOUNTER — Other Ambulatory Visit (HOSPITAL_COMMUNITY): Payer: Self-pay

## 2024-02-07 MED ORDER — ATORVASTATIN CALCIUM 10 MG PO TABS
10.0000 mg | ORAL_TABLET | Freq: Every day | ORAL | 0 refills | Status: DC
  Filled 2024-02-07: qty 90, 90d supply, fill #0

## 2024-02-16 DIAGNOSIS — C50912 Malignant neoplasm of unspecified site of left female breast: Secondary | ICD-10-CM | POA: Diagnosis not present

## 2024-02-16 DIAGNOSIS — R0789 Other chest pain: Secondary | ICD-10-CM | POA: Diagnosis not present

## 2024-02-17 DIAGNOSIS — R0789 Other chest pain: Secondary | ICD-10-CM | POA: Diagnosis not present

## 2024-02-20 ENCOUNTER — Other Ambulatory Visit: Payer: Self-pay | Admitting: Family Medicine

## 2024-02-20 ENCOUNTER — Other Ambulatory Visit: Payer: Self-pay | Admitting: Hematology and Oncology

## 2024-02-20 DIAGNOSIS — C7A8 Other malignant neuroendocrine tumors: Secondary | ICD-10-CM

## 2024-02-20 DIAGNOSIS — Z170 Estrogen receptor positive status [ER+]: Secondary | ICD-10-CM

## 2024-02-20 DIAGNOSIS — R918 Other nonspecific abnormal finding of lung field: Secondary | ICD-10-CM

## 2024-02-20 NOTE — Progress Notes (Signed)
 Patient is complaining of left breast pain.  This happens especially when she coughs.  To evaluate this further a CT chest is being done by her primary care physician. She has not had a mammogram in the last 2 years and therefore I would like to obtain a mammogram at the breast center for further evaluation.  We would like to see her in the next couple of months to review these results. Patient has consented to be written in a case report for publication.

## 2024-02-23 ENCOUNTER — Other Ambulatory Visit (HOSPITAL_COMMUNITY): Payer: Self-pay

## 2024-02-23 ENCOUNTER — Ambulatory Visit (HOSPITAL_COMMUNITY)
Admission: RE | Admit: 2024-02-23 | Discharge: 2024-02-23 | Disposition: A | Discharge status: Home / Self Care | Payer: Self-pay | Source: Ambulatory Visit | Attending: Physician Assistant | Admitting: Physician Assistant

## 2024-02-23 ENCOUNTER — Encounter (HOSPITAL_COMMUNITY): Payer: Self-pay

## 2024-02-23 VITALS — BP 132/73 | HR 82 | Temp 98.4°F | Resp 18

## 2024-02-23 DIAGNOSIS — R0981 Nasal congestion: Secondary | ICD-10-CM | POA: Diagnosis not present

## 2024-02-23 DIAGNOSIS — J0140 Acute pansinusitis, unspecified: Secondary | ICD-10-CM | POA: Diagnosis not present

## 2024-02-23 LAB — POC COVID19/FLU A&B COMBO
Covid Antigen, POC: NEGATIVE
Influenza A Antigen, POC: NEGATIVE
Influenza B Antigen, POC: NEGATIVE

## 2024-02-23 MED ORDER — AMOXICILLIN-POT CLAVULANATE 875-125 MG PO TABS
1.0000 | ORAL_TABLET | Freq: Two times a day (BID) | ORAL | 0 refills | Status: DC
  Filled 2024-02-23: qty 14, 7d supply, fill #0

## 2024-02-23 NOTE — ED Triage Notes (Signed)
 Here for sinus congestion and headache x 4 days. Patient reports some nausea due to the drainage.

## 2024-02-23 NOTE — Discharge Instructions (Signed)
 As we discussed, it is possible this is just a virus.  I would recommend for the next couple days using medication such as Mucinex, Flonase, Tylenol, ibuprofen.  I also recommend nasal saline and sinus rinses.  Start Augmentin in a couple days if your symptoms are not improving or if anything worsens.  If anything changes and you have high fever, worsening cough, shortness of breath, chest pain, nausea/vomiting interfering with oral intake you need to be seen immediately.

## 2024-02-23 NOTE — ED Provider Notes (Signed)
 [facility]    CSN: [id number] Arrival date & time: 02/23/24  1021      History   Chief Complaint Chief Complaint  Patient presents with   Nasal Congestion    Chest congestion - Entered by patient    HPI Vaniah Chambers Lax is a 63 y.o. female.   Patient presents today with a 4-day history of URI symptoms including congestion, sinus pressure, cough.  Denies any fever, chest pain, shortness of breath, nausea, vomiting, diarrhea.  She has not taken any over-the-counter medication specific for the symptoms.  She does take fluticasone nasal spray regularly and has been using this medicine.  She also takes Aleve back and body regularly but has not taken additional doses.  She has not had COVID in several years.  She is up-to-date on COVID-19 vaccinations.  Denies any recent antibiotics or steroids.  She denies any history of COPD or asthma.  She does smoke.      Past Medical History:  Diagnosis Date   Arthritis    osteoarthritis- hip   Endometriosis    Heart murmur    as a child, reports as benign    Hypertension     Patient Active Problem List   Diagnosis Date Noted   Essential hypertension 05/16/2023   Elevated LDL cholesterol level 05/16/2023   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) 06/23/2022   Other malignant neuroendocrine tumors (HCC) 03/31/2022   Unilateral primary osteoarthritis, left hip 12/08/2018   Status post total replacement of left hip 12/08/2018   Osteoarthritis of right hip 06/04/2016   Avascular necrosis of bones of both hips (HCC) 06/04/2016   Status post total replacement of right hip 06/04/2016    Past Surgical History:  Procedure Laterality Date   BREAST BIOPSY Left 03/22/2022   lt 12:00 venus clip path pending   BREAST LUMPECTOMY Left 05/26/2022   Procedure: BREAST LUMPECTOMY WITH EXCISION OF SENTINEL NODE;  Surgeon: Griselda Miner, MD;  Location: [facility];  Service: General;  Laterality:  Left;  GEN & PEC BLOCK   DIAGNOSTIC LAPAROSCOPY     endometriosis- 20 yrs ago   TOTAL HIP ARTHROPLASTY Right 06/04/2016   Procedure: RIGHT TOTAL HIP ARTHROPLASTY ANTERIOR APPROACH;  Surgeon: Kathryne Hitch, MD;  Location: [facility];  Service: Orthopedics;  Laterality: Right;   TOTAL HIP ARTHROPLASTY Left 12/08/2018   Procedure: LEFT TOTAL HIP ARTHROPLASTY ANTERIOR APPROACH;  Surgeon: Kathryne Hitch, MD;  Location: [facility];  Service: Orthopedics;  Laterality: Left;    OB History     Gravida  0   Para  0   Term  0   Preterm  0   AB  0   Living  0      SAB  0   IAB  0   Ectopic  0   Multiple  0   Live Births  0            Home Medications    Prior to Admission medications   Medication Sig Start Date End Date Taking? Authorizing Provider  amLODipine (NORVASC) 5 MG tablet Take 1 tablet (5 mg total) by mouth daily. 03/23/23  Yes   amoxicillin-clavulanate (AUGMENTIN) 875-125 MG tablet Take 1 tablet by mouth every 12 (twelve) hours. 02/23/24  Yes Arnulfo Batson, Noberto Retort, PA-C  aspirin 81 MG chewable tablet Chew 81 mg by mouth daily.   Yes [provider]  atorvastatin (LIPITOR) 10 MG tablet Take 1 tablet (10  mg total) by mouth daily. 10/03/23  Yes   Biotin w/ Vitamins C & E (HAIR/SKIN/NAILS PO) Take 1 tablet by mouth daily.   Yes [provider]  fluticasone (FLONASE) 50 MCG/ACT nasal spray Place 2 sprays into both nostrils daily. 03/23/23  Yes   Glucosamine HCl-MSM (GLUCOSAMINE-MSM PO) Take 2 tablets by mouth daily.   Yes [provider]  losartan (COZAAR) 100 MG tablet Take 1 tablet (100 mg total) by mouth daily. 03/23/23  Yes   metoprolol succinate (TOPROL-XL) 100 MG 24 hr tablet Take 1.5 tablets (150 mg total) by mouth daily. 04/16/22  Yes Gweneth Dimitri, MD  milk thistle 175 MG tablet Take 175 mg by mouth 2 (two) times daily.    Yes [provider]  Varenicline Tartrate,Continue, 1 MG TABS Take 1 tablet by mouth 2 (two) times  daily. 03/23/23  Yes   atorvastatin (LIPITOR) 10 MG tablet Take 1 tablet (10 mg total) by mouth daily. 01/03/24     Cholecalciferol (VITAMIN D3 PO) Take by mouth.    [provider]  diphenhydrAMINE (BENADRYL) 25 mg capsule Take 25 mg by mouth at bedtime.    [provider]  letrozole (FEMARA) 2.5 MG tablet Take 1 tablet (2.5 mg total) by mouth daily. 08/12/22   Serena Croissant, MD  naproxen sodium (ALEVE) 220 MG tablet Take 220 mg by mouth daily as needed.    [provider]  tetrahydrozoline 0.05 % ophthalmic solution Place 2 drops into both eyes daily.    [provider]  tiZANidine (ZANAFLEX) 4 MG tablet Take 1 tablet (4 mg total) by mouth at bedtime as needed. 12/19/23       Family History Family History  Problem Relation Age of Onset   Cancer Father        Lung cancer- passed 2005    Hypertension Brother     Social History Social History   Tobacco Use   Smoking status: Every Day    Current packs/day: 1.00    Average packs/day: 1 pack/day for 20.0 years (20.0 ttl pk-yrs)    Types: Cigarettes   Smokeless tobacco: Never   Tobacco comments:    past was able to quit- now smoking 0.5 ppd  Vaping Use   Vaping status: Some Days  Substance Use Topics   Alcohol use: Yes    Comment: 2-3 times weekly   Drug use: No     Allergies   Hydrochlorothiazide   Review of Systems Review of Systems  Constitutional:  Positive for activity change. Negative for appetite change, fatigue and fever.  HENT:  Positive for congestion and sinus pressure. Negative for postnasal drip, sneezing and sore throat.   Respiratory:  Positive for cough. Negative for shortness of breath.   Cardiovascular:  Negative for chest pain.  Gastrointestinal:  Positive for nausea. Negative for abdominal pain, diarrhea and vomiting.  Musculoskeletal:  Negative for arthralgias and myalgias.  Neurological:  Positive for headaches. Negative for dizziness and light-headedness.      Physical Exam Triage Vital Signs ED Triage Vitals  Encounter Vitals Group     BP 02/23/24 1038 132/73     Systolic BP Percentile --      Diastolic BP Percentile --      Pulse Rate 02/23/24 1038 82     Resp 02/23/24 1038 18     Temp 02/23/24 1038 98.4 F (36.9 C)     Temp Source 02/23/24 1038 Oral     SpO2 02/23/24 1038 97 %  Weight --      Height --      Head Circumference --      Peak Flow --      Pain Score 02/23/24 1042 0     Pain Loc --      Pain Education --      Exclude from Growth Chart --    No data found.  Updated Vital Signs BP 132/73 (BP Location: Left Arm)   Pulse 82   Temp 98.4 F (36.9 C) (Oral)   Resp 18   LMP 08/27/2008 (Approximate)   SpO2 97%   Visual Acuity Right Eye Distance:   Left Eye Distance:   Bilateral Distance:    Right Eye Near:   Left Eye Near:    Bilateral Near:     Physical Exam Vitals reviewed.  Constitutional:      General: She is awake. She is not in acute distress.    Appearance: Normal appearance. She is well-developed. She is not ill-appearing.     Comments: Very pleasant female appears stated age in no acute distress sitting comfortably in exam room  HENT:     Head: Normocephalic and atraumatic.     Right Ear: Tympanic membrane, ear canal and external ear normal. Tympanic membrane is not erythematous or bulging.     Left Ear: Tympanic membrane, ear canal and external ear normal. Tympanic membrane is not erythematous or bulging.     Nose: Congestion present.     Right Sinus: Maxillary sinus tenderness and frontal sinus tenderness present.     Left Sinus: Maxillary sinus tenderness and frontal sinus tenderness present.     Mouth/Throat:     Pharynx: Uvula midline. Postnasal drip present. No oropharyngeal exudate or posterior oropharyngeal erythema.  Cardiovascular:     Rate and Rhythm: Normal rate and regular rhythm.     Heart sounds: Normal heart sounds, S1 normal and S2 normal. No murmur heard. Pulmonary:      Effort: Pulmonary effort is normal.     Breath sounds: Normal breath sounds. No wheezing, rhonchi or rales.     Comments: Clear to auscultation bilaterally Psychiatric:        Behavior: Behavior is cooperative.      UC Treatments / Results  Labs (all labs ordered are listed, but only abnormal results are displayed) Labs Reviewed  POC COVID19/FLU A&B COMBO    EKG   Radiology No results found.  Procedures Procedures (including critical care time)  Medications Ordered in UC Medications - No data to display  Initial Impression / Assessment and Plan / UC Course  I have reviewed the triage vital signs and the nursing notes.  Pertinent labs & imaging results that were available during my care of the patient were reviewed by me and considered in my medical decision making (see chart for details).     Patient is well-appearing, afebrile, nontoxic, nontachycardic.  COVID and flu testing was negative.  We discussed symptoms are likely related to virus but patient reports her symptoms have significantly worsened.  I did recommend that she hold off for a few more days to see if her symptoms improve but will send in an antibiotic that she can have on hand.  We discussed that this can cause side effects and so I do not recommend taking it unless her symptoms are not improving or if they worsen.  She can use over-the-counter medication including Mucinex, Flonase, Tylenol, ibuprofen, nasal saline/sinus rinses.  Recommended that she rest and drink plenty  of fluid.  If anything worsens she needs to be seen immediately including chest pain, shortness of breath, worsening cough, fever, nausea/vomiting interfering with oral intake, lethargy.  Strict return precautions given.  Excuse note provided.   Final Clinical Impressions(s) / UC Diagnoses   Final diagnoses:  Acute non-recurrent pansinusitis  Nasal congestion     Discharge Instructions      As we discussed, it is possible this is just  a virus.  I would recommend for the next couple days using medication such as Mucinex, Flonase, Tylenol, ibuprofen.  I also recommend nasal saline and sinus rinses.  Start Augmentin in a couple days if your symptoms are not improving or if anything worsens.  If anything changes and you have high fever, worsening cough, shortness of breath, chest pain, nausea/vomiting interfering with oral intake you need to be seen immediately.     ED Prescriptions     Medication Sig Dispense Auth. Provider   amoxicillin-clavulanate (AUGMENTIN) 875-125 MG tablet Take 1 tablet by mouth every 12 (twelve) hours. 14 tablet Delainy Mcelhiney, Noberto Retort, PA-C      PDMP not reviewed this encounter.   Jeani Hawking, PA-C 02/23/24 1142

## 2024-03-06 ENCOUNTER — Other Ambulatory Visit (HOSPITAL_COMMUNITY): Payer: Self-pay

## 2024-03-19 ENCOUNTER — Ambulatory Visit
Admission: RE | Admit: 2024-03-19 | Discharge: 2024-03-19 | Disposition: A | Discharge status: Home / Self Care | Payer: 59 | Source: Ambulatory Visit | Attending: Hematology and Oncology | Admitting: Hematology and Oncology

## 2024-03-19 DIAGNOSIS — Z853 Personal history of malignant neoplasm of breast: Secondary | ICD-10-CM | POA: Diagnosis not present

## 2024-03-19 DIAGNOSIS — C50412 Malignant neoplasm of upper-outer quadrant of left female breast: Secondary | ICD-10-CM

## 2024-03-19 DIAGNOSIS — Z08 Encounter for follow-up examination after completed treatment for malignant neoplasm: Secondary | ICD-10-CM | POA: Diagnosis not present

## 2024-03-19 DIAGNOSIS — C7A8 Other malignant neuroendocrine tumors: Secondary | ICD-10-CM

## 2024-03-29 DIAGNOSIS — Z124 Encounter for screening for malignant neoplasm of cervix: Secondary | ICD-10-CM | POA: Diagnosis not present

## 2024-03-29 DIAGNOSIS — C50912 Malignant neoplasm of unspecified site of left female breast: Secondary | ICD-10-CM | POA: Diagnosis not present

## 2024-03-29 DIAGNOSIS — Z1331 Encounter for screening for depression: Secondary | ICD-10-CM | POA: Diagnosis not present

## 2024-03-29 DIAGNOSIS — M898X1 Other specified disorders of bone, shoulder: Secondary | ICD-10-CM | POA: Diagnosis not present

## 2024-03-29 DIAGNOSIS — F17210 Nicotine dependence, cigarettes, uncomplicated: Secondary | ICD-10-CM | POA: Diagnosis not present

## 2024-03-29 DIAGNOSIS — E559 Vitamin D deficiency, unspecified: Secondary | ICD-10-CM | POA: Diagnosis not present

## 2024-03-29 DIAGNOSIS — I10 Essential (primary) hypertension: Secondary | ICD-10-CM | POA: Diagnosis not present

## 2024-03-29 DIAGNOSIS — E7800 Pure hypercholesterolemia, unspecified: Secondary | ICD-10-CM | POA: Diagnosis not present

## 2024-03-29 DIAGNOSIS — R9389 Abnormal findings on diagnostic imaging of other specified body structures: Secondary | ICD-10-CM | POA: Diagnosis not present

## 2024-03-29 DIAGNOSIS — Z0000 Encounter for general adult medical examination without abnormal findings: Secondary | ICD-10-CM | POA: Diagnosis not present

## 2024-04-17 ENCOUNTER — Other Ambulatory Visit (HOSPITAL_COMMUNITY): Payer: Self-pay

## 2024-04-17 DIAGNOSIS — E871 Hypo-osmolality and hyponatremia: Secondary | ICD-10-CM | POA: Diagnosis not present

## 2024-04-17 MED ORDER — METOPROLOL SUCCINATE ER 100 MG PO TB24
150.0000 mg | ORAL_TABLET | Freq: Every day | ORAL | 0 refills | Status: DC
  Filled 2024-04-17: qty 135, 90d supply, fill #0

## 2024-04-18 ENCOUNTER — Other Ambulatory Visit (HOSPITAL_COMMUNITY): Payer: Self-pay

## 2024-04-18 MED ORDER — AMLODIPINE BESYLATE 5 MG PO TABS
5.0000 mg | ORAL_TABLET | Freq: Every day | ORAL | 1 refills | Status: DC
  Filled 2024-04-18: qty 90, 90d supply, fill #0

## 2024-04-18 MED ORDER — LOSARTAN POTASSIUM 100 MG PO TABS
100.0000 mg | ORAL_TABLET | Freq: Every day | ORAL | 1 refills | Status: DC
  Filled 2024-04-18: qty 90, 90d supply, fill #0

## 2024-04-26 ENCOUNTER — Ambulatory Visit: Admitting: Physician Assistant

## 2024-04-27 ENCOUNTER — Other Ambulatory Visit (INDEPENDENT_AMBULATORY_CARE_PROVIDER_SITE_OTHER): Payer: Self-pay

## 2024-04-27 ENCOUNTER — Other Ambulatory Visit (HOSPITAL_COMMUNITY): Payer: Self-pay

## 2024-04-27 ENCOUNTER — Encounter: Payer: Self-pay | Admitting: Orthopaedic Surgery

## 2024-04-27 ENCOUNTER — Ambulatory Visit: Admitting: Orthopaedic Surgery

## 2024-04-27 DIAGNOSIS — M25552 Pain in left hip: Secondary | ICD-10-CM

## 2024-04-27 DIAGNOSIS — R109 Unspecified abdominal pain: Secondary | ICD-10-CM | POA: Diagnosis not present

## 2024-04-27 DIAGNOSIS — M25551 Pain in right hip: Secondary | ICD-10-CM

## 2024-04-27 MED ORDER — PREDNISONE 10 MG (21) PO TBPK
ORAL_TABLET | ORAL | 3 refills | Status: DC
  Filled 2024-04-27: qty 21, 6d supply, fill #0

## 2024-04-27 MED ORDER — CYCLOBENZAPRINE HCL 5 MG PO TABS
5.0000 mg | ORAL_TABLET | Freq: Every evening | ORAL | 3 refills | Status: DC | PRN
  Filled 2024-04-27: qty 20, 20d supply, fill #0

## 2024-04-27 NOTE — Progress Notes (Signed)
 Office Visit Note   Patient: Angela Lin           Date of Birth: [birth year]           MRN: [id number] Visit Date: 04/27/2024              Requested by: Helyn Lobstein, MD [address] [place],  [place] [address] PCP: Helyn Lobstein, MD   Assessment & Plan: Visit Diagnoses:  1. Bilateral hip pain     History of Present Illness Angela Lin is a 63 year old female who presents with right groin and lumbar pain.  She experiences burning and crippling pain in the upper right buttock over the lumbar spine and sacroiliac area, without radiation, numbness, or tingling. 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 She has been walking on concrete floors and using a rolly cart for three weeks, which she believes has contributed to her pain. She has been taking muscle relaxers and recently started using Advil for relief. 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  Physical Exam MUSCULOSKELETAL: No pain with movement of the hips. Tenderness in the right paraspinal muscles.  No focal findings in the lower extremities.  No sciatic tension signs.  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 Pain localized to right paraspinal muscles. Hip replacements intact. - Prescribe prednisone  for anti-inflammatory effect. - Prescribe muscle relaxer for muscle tension. - Consider second dose pack or physical therapy if no improvement. - Advise light duty work. - Send prescription to [facility].  Follow-Up Instructions: No follow-ups on file.   Orders:  Orders Placed This Encounter  Procedures   XR HIPS BILAT W OR W/O PELVIS 3-4 VIEWS   XR Lumbar Spine 2-3 Views    Meds ordered this encounter  Medications   predniSONE  (STERAPRED UNI-PAK 21 TAB) 10 MG (21) TBPK tablet    Sig: Take as directed per package instructions    Dispense:  21 tablet    Refill:  3   cyclobenzaprine  (FLEXERIL ) 5 MG tablet    Sig: Take 1 tablet (5 mg total) by mouth at bedtime as needed for muscle spasms.    Dispense:  20 tablet    Refill:  3   Subjective: Chief Complaint  Patient presents with   Right Hip - Pain   Left Hip - Pain     Review of Systems  Constitutional: Negative.   HENT: Negative.    Eyes: Negative.   Respiratory: Negative.    Cardiovascular: Negative.   Endocrine: Negative.   Musculoskeletal: Negative.   Neurological: Negative.   Hematological: Negative.   Psychiatric/Behavioral: Negative.    All other systems reviewed and are negative.    Objective: Vital Signs: LMP 08/27/2008 (Approximate)   Physical Exam Vitals and nursing note reviewed.  Constitutional:      Appearance: She is well-developed.  HENT:     Head: Atraumatic.     Nose: Nose normal.  Eyes:     Extraocular Movements: Extraocular movements intact.  Cardiovascular:     Pulses: Normal pulses.  Pulmonary:     Effort: Pulmonary effort is normal.  Abdominal:     Palpations: Abdomen is soft.  Musculoskeletal:     Cervical back: Neck supple.  Skin:    General: Skin is warm.     Capillary Refill: Capillary refill takes less than 2 seconds.  Neurological:     Mental Status: She is alert. Mental status is at baseline.  Psychiatric:        Behavior: Behavior normal.        Thought Content: Thought content normal.        Judgment: Judgment normal.     Imaging: XR Lumbar Spine 2-3 Views Result Date: 04/27/2024 X-rays of the lumbar spine show diffuse degenerative changes and facet disease.  Grade 1 spondylolisthesis of L4-5 and degenerative disc disease at L5-S1.  No acute abnormalities.  XR HIPS BILAT W OR W/O PELVIS 3-4 VIEWS Result Date: 04/27/2024 X-rays of the  pelvis and bilateral hips show status post bilateral total hip arthroplasties without any complications.    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) 06/23/2022   Other malignant neuroendocrine tumors (HCC) 03/31/2022   Unilateral primary osteoarthritis, left hip 12/08/2018   Status post total replacement of left hip 12/08/2018   Osteoarthritis of right hip 06/04/2016   Avascular necrosis of bones of both hips (HCC) 06/04/2016   Status post total replacement of right hip 06/04/2016   Past Medical History:  Diagnosis Date   Arthritis    osteoarthritis- hip   Endometriosis    Heart murmur    as a child, reports as benign    Hypertension     Family History  Problem Relation Age of Onset   Cancer Father        Lung cancer- passed 2005    Hypertension Brother     Past Surgical History:  Procedure Laterality Date   BREAST BIOPSY Left 03/22/2022   lt 12:00 venus clip path pending   BREAST LUMPECTOMY Left 05/26/2022   Procedure: BREAST LUMPECTOMY WITH EXCISION OF SENTINEL NODE;  Surgeon: Caralyn Chandler, MD;  Location: [facility];  Service: General;  Laterality: Left;  GEN & PEC BLOCK   DIAGNOSTIC LAPAROSCOPY     endometriosis- 20 yrs ago   TOTAL HIP ARTHROPLASTY Right 06/04/2016   Procedure: RIGHT TOTAL HIP ARTHROPLASTY ANTERIOR APPROACH;  Surgeon: Arnie Lao, MD;  Location: [facility];  Service: Orthopedics;  Laterality: Right;   TOTAL HIP ARTHROPLASTY Left 12/08/2018   Procedure: LEFT TOTAL HIP ARTHROPLASTY ANTERIOR APPROACH;  Surgeon: Arnie Lao, MD;  Location: [facility];  Service: Orthopedics;  Laterality: Left;   Social History   Occupational History   Not on file  Tobacco Use   Smoking status: Every Day    Current packs/day: 1.00    Average packs/day: 1 pack/day for  20.0 years (20.0 ttl pk-yrs)    Types: Cigarettes   Smokeless tobacco: Never   Tobacco comments:    past was able to quit- now smoking 0.5 ppd  Vaping Use   Vaping status: Some Days  Substance and Sexual Activity   Alcohol use: Yes    Comment: 2-3 times weekly   Drug use: No   Sexual activity: Not Currently    Birth control/protection: Post-menopausal

## 2024-04-30 ENCOUNTER — Telehealth: Payer: Self-pay | Admitting: Orthopaedic Surgery

## 2024-04-30 ENCOUNTER — Telehealth: Payer: Self-pay

## 2024-04-30 NOTE — Telephone Encounter (Signed)
 Patient left message on triage phone stating her leg is "locked" and has been ever since appointment Friday. Wants a call back with a possible appointment to be seen.

## 2024-04-30 NOTE — Telephone Encounter (Signed)
 Patient called and left VM on triage. States she saw Dr. Christiane Cowing Friday AM 04/27/2024. States he was checking her mobility and since then her left leg is locked. Would like to know if she needs to do certain excersies to "unlock" leg. She states she is taking Muscle Relaxers and Prednisone .  CB [phone]

## 2024-04-30 NOTE — Telephone Encounter (Signed)
 Called and spoke with patient. Scheduled for tomorrow at 1:30 with Loris Ros.

## 2024-04-30 NOTE — Telephone Encounter (Signed)
 I'm not sure what she means by that but she's welcome to make an appointment to come in.

## 2024-05-01 ENCOUNTER — Ambulatory Visit: Admitting: Physician Assistant

## 2024-05-01 ENCOUNTER — Other Ambulatory Visit (HOSPITAL_COMMUNITY): Payer: Self-pay

## 2024-05-01 DIAGNOSIS — M25552 Pain in left hip: Secondary | ICD-10-CM | POA: Diagnosis not present

## 2024-05-01 MED ORDER — PREDNISONE 10 MG (21) PO TBPK
ORAL_TABLET | ORAL | 0 refills | Status: DC
  Filled 2024-05-01: qty 21, 6d supply, fill #0

## 2024-05-01 MED ORDER — TRAMADOL HCL 50 MG PO TABS
50.0000 mg | ORAL_TABLET | Freq: Two times a day (BID) | ORAL | 0 refills | Status: DC | PRN
  Filled 2024-05-01: qty 30, 15d supply, fill #0

## 2024-05-01 MED ORDER — CYCLOBENZAPRINE HCL 5 MG PO TABS
5.0000 mg | ORAL_TABLET | Freq: Every evening | ORAL | 3 refills | Status: DC | PRN
  Filled 2024-05-01: qty 20, 20d supply, fill #0

## 2024-05-01 NOTE — Progress Notes (Signed)
 Office Visit Note   Patient: Angela Lin           Date of Birth: 1961-09-18           MRN: [id number] Visit Date: 05/01/2024              Requested by: Helyn Lobstein, MD [address] [place],  [place] [address] PCP: Helyn Lobstein, MD   Assessment & Plan: Visit Diagnoses:  1. Pain in left hip     Plan: Impression is left hip flexor strain.  I do not think this new pain to the left hip is coming from her back.  No new x-rays were warranted today and x-rays from last Friday showed a well-seated prosthesis without complication.  At this point, have discussed sending in another steroid pack as well as continue with the muscle relaxers.  Have also discussed heat with the addition of physical therapy.  Referral has been made.  Follow-up as needed.  Follow-Up Instructions: Return if symptoms worsen or fail to improve.   Orders:  Orders Placed This Encounter  Procedures   Ambulatory referral to Physical Therapy   Meds ordered this encounter  Medications   cyclobenzaprine  (FLEXERIL ) 5 MG tablet    Sig: Take 1 tablet (5 mg total) by mouth at bedtime as needed for muscle spasms.    Dispense:  20 tablet    Refill:  3   predniSONE  (STERAPRED UNI-PAK 21 TAB) 10 MG (21) TBPK tablet    Sig: Take as directed per package instructions    Dispense:  21 tablet    Refill:  0      Procedures: No procedures performed   Clinical Data: No additional findings.   Subjective: Chief Complaint  Patient presents with   Left Hip - Pain    HPI patient is a pleasant 63 year old female who comes in today with left hip pain.  She was seen in our office this past Friday for right low back and right lower extremity radiculopathy.  She was started on a steroid pack and Flexeril  which has significantly helped her right side other than continued burning.  Since her visit here, she has complained of left groin pain which has progressively worsened.  She feels as though her hip needs to  pop as there is a fair amount of tightness in the area.  She is thinking this is from the physical exam.  Her pain occurs when she is walking.  She has been taking the prednisone , Flexeril  and using heat without improvement to the left hip.  Review of Systems as detailed in HPI.  All others reviewed and are negative.   Objective: Vital Signs: LMP 08/27/2008 (Approximate)   Physical Exam well-developed well-nourished female no acute distress.  Alert and oriented x 3.  Ortho Exam left hip exam: No pain with logroll, FADIR or Stinchfield testing.  She does have pain with resisted abduction and extension.  No pain with abduction, hip flexion.  Negative straight leg raise.  Unremarkable lumbar spine exam.  No focal weakness.  She is neurovascularly intact distally.  Specialty Comments:  No specialty comments available.  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) 06/23/2022   Other malignant neuroendocrine tumors (HCC) 03/31/2022   Unilateral primary osteoarthritis, left hip 12/08/2018   Status post  total replacement of left hip 12/08/2018   Osteoarthritis of right hip 06/04/2016   Avascular necrosis of bones of both hips (HCC) 06/04/2016   Status post total replacement of right hip 06/04/2016   Past Medical History:  Diagnosis Date   Arthritis    osteoarthritis- hip   Endometriosis    Heart murmur    as a child, reports as benign    Hypertension     Family History  Problem Relation Age of Onset   Cancer Father        Lung cancer- passed 2005    Hypertension Brother     Past Surgical History:  Procedure Laterality Date   BREAST BIOPSY Left 03/22/2022   lt 12:00 venus clip path pending   BREAST LUMPECTOMY Left 05/26/2022   Procedure: BREAST LUMPECTOMY WITH EXCISION OF SENTINEL  NODE;  Surgeon: Caralyn Chandler, MD;  Location: [facility];  Service: General;  Laterality: Left;  GEN & PEC BLOCK   DIAGNOSTIC LAPAROSCOPY     endometriosis- 20 yrs ago   TOTAL HIP ARTHROPLASTY Right 06/04/2016   Procedure: RIGHT TOTAL HIP ARTHROPLASTY ANTERIOR APPROACH;  Surgeon: Arnie Lao, MD;  Location: [facility];  Service: Orthopedics;  Laterality: Right;   TOTAL HIP ARTHROPLASTY Left 12/08/2018   Procedure: LEFT TOTAL HIP ARTHROPLASTY ANTERIOR APPROACH;  Surgeon: Arnie Lao, MD;  Location: [facility];  Service: Orthopedics;  Laterality: Left;   Social History   Occupational History   Not on file  Tobacco Use   Smoking status: Every Day    Current packs/day: 1.00    Average packs/day: 1 pack/day for 20.0 years (20.0 ttl pk-yrs)    Types: Cigarettes   Smokeless tobacco: Never   Tobacco comments:    past was able to quit- now smoking 0.5 ppd  Vaping Use   Vaping status: Some Days  Substance and Sexual Activity   Alcohol use: Yes    Comment: 2-3 times weekly   Drug use: No   Sexual activity: Not Currently    Birth control/protection: Post-menopausal

## 2024-05-10 ENCOUNTER — Telehealth: Payer: Self-pay | Admitting: Orthopaedic Surgery

## 2024-05-10 NOTE — Telephone Encounter (Signed)
 Patient called. Says she needs something for pain.

## 2024-05-11 ENCOUNTER — Other Ambulatory Visit: Payer: Self-pay | Admitting: Physician Assistant

## 2024-05-11 NOTE — Telephone Encounter (Signed)
Advised patient. She is aware.

## 2024-05-11 NOTE — Telephone Encounter (Signed)
 She will call us  back if she does want a referral to pain management.

## 2024-05-11 NOTE — Telephone Encounter (Signed)
 Sent in tramadol  for twice daily #30 on 5/6.  Too early to be refilled.  Cannot write anything stronger.  Can reach out to pcp if they can write anything stronger or we can refer to pain mgmt for stronger pain meds

## 2024-05-16 ENCOUNTER — Encounter: Payer: Self-pay | Admitting: Rehabilitative and Restorative Service Providers"

## 2024-05-16 ENCOUNTER — Ambulatory Visit: Admitting: Rehabilitative and Restorative Service Providers"

## 2024-05-16 DIAGNOSIS — R293 Abnormal posture: Secondary | ICD-10-CM | POA: Diagnosis not present

## 2024-05-16 DIAGNOSIS — M25551 Pain in right hip: Secondary | ICD-10-CM

## 2024-05-16 DIAGNOSIS — M25552 Pain in left hip: Secondary | ICD-10-CM

## 2024-05-16 DIAGNOSIS — M5459 Other low back pain: Secondary | ICD-10-CM | POA: Diagnosis not present

## 2024-05-16 DIAGNOSIS — R262 Difficulty in walking, not elsewhere classified: Secondary | ICD-10-CM

## 2024-05-16 DIAGNOSIS — M6281 Muscle weakness (generalized): Secondary | ICD-10-CM

## 2024-05-16 NOTE — Therapy (Signed)
 OUTPATIENT PHYSICAL THERAPY EVALUATION   Patient Name: Angela Lin MRN: [id number] DOB:13-Sep-1961, 63 y.o., female Today's Date: 05/16/2024  END OF SESSION:  PT End of Session - 05/16/24 1344     Visit Number 1    Number of Visits 20    Date for PT Re-Evaluation 07/25/24    Authorization Type [employer] Save    Progress Note Due on Visit 10    PT Start Time 1415    PT Stop Time 1456    PT Time Calculation (min) 41 min    Activity Tolerance Patient limited by pain;Patient limited by fatigue    Behavior During Therapy Restless;Anxious             Past Medical History:  Diagnosis Date   Arthritis    osteoarthritis- hip   Endometriosis    Heart murmur    as a child, reports as benign    Hypertension    Past Surgical History:  Procedure Laterality Date   BREAST BIOPSY Left 03/22/2022   lt 12:00 venus clip path pending   BREAST LUMPECTOMY Left 05/26/2022   Procedure: BREAST LUMPECTOMY WITH EXCISION OF SENTINEL NODE;  Surgeon: Caralyn Chandler, MD;  Location: [facility];  Service: General;  Laterality: Left;  GEN & PEC BLOCK   DIAGNOSTIC LAPAROSCOPY     endometriosis- 20 yrs ago   TOTAL HIP ARTHROPLASTY Right 06/04/2016   Procedure: RIGHT TOTAL HIP ARTHROPLASTY ANTERIOR APPROACH;  Surgeon: Arnie Lao, MD;  Location: [facility];  Service: Orthopedics;  Laterality: Right;   TOTAL HIP ARTHROPLASTY Left 12/08/2018   Procedure: LEFT TOTAL HIP ARTHROPLASTY ANTERIOR APPROACH;  Surgeon: Arnie Lao, MD;  Location: [facility];  Service: Orthopedics;  Laterality: Left;   Patient Active Problem List   Diagnosis Date Noted   Essential hypertension 05/16/2023   Elevated LDL cholesterol level 05/16/2023   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) 06/23/2022   Other malignant neuroendocrine tumors (HCC) 03/31/2022   Unilateral primary osteoarthritis, left hip 12/08/2018   Status post total replacement of  left hip 12/08/2018   Osteoarthritis of right hip 06/04/2016   Avascular necrosis of bones of both hips (HCC) 06/04/2016   Status post total replacement of right hip 06/04/2016    PCP: Helyn Lobstein MD  REFERRING PROVIDER: Sandie Cross, PA-C  REFERRING DIAG: [phone] (ICD-10-CM) - Pain in left hip  Rationale for Evaluation and Treatment: Rehabilitation  THERAPY DIAG:  Pain in left hip  Pain in right hip  Abnormal posture  Other low back pain  Muscle weakness (generalized)  Difficulty in walking, not elsewhere classified  ONSET DATE: Late April 2025  SUBJECTIVE:  SUBJECTIVE STATEMENT: 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  Pt indicated having increased trouble with walking and movements.  Pt indicated complaints of bilateral superior aspects of hips and also in groins with tightness reported in in Lt hip more.  Pt indicated pain and fatigue.  Pt indicated onset of back pain. Pt indicated having some trouble waking with sleep but better now.    Pt indicated reduced appetite with weight loss noted.  Mother present in intervention and evaluation.   Pt also mentioned Lt rib and chest tenderness at times as well.   PERTINENT HISTORY:  Arthritis, HTN, history of breast cancer.  Bilateral hip replacements Rt 2017, 2019  PAIN:  NPRS scale: at current  sitting: 7 /10  , at worst 10/10.  At best 3/10 Pain location: Lt hip, Rt hip, back  Pain description: superior lateral hips, anterior hip.  Aggravating factors: walking, bed tranfers, car transfers Relieving factors: tylenol   (doesn't want to take medicine)  PRECAUTIONS: None  WEIGHT BEARING RESTRICTIONS: No  FALLS:  Has patient fallen in last 6 months? No  LIVING  ENVIRONMENT: Lives with: Alone Lives in: House/apartment Stairs: 2 stairs to enter with bilateral hand rails    OCCUPATION: Works in [employer].   Mostly working from home computer based.  (hasn't worked in about a week).   PLOF: Independent, walking , working in yard, dog sitting  PATIENT GOALS: Reduce pain, get back to normal activity   OBJECTIVE:   PATIENT SURVEYS:  Patient-Specific Activity Scoring Scheme  "0" represents "unable to perform." "10" represents "able to perform at prior level. 0 1 2 3 4 5 6 7 8 9  10 (Date and Score)   Activity Eval  05/16/2024    1. Work   0    2. Walking   2    3. Tranfers (bed, car, chair) 3   4.    5.    Score 1.667 avg    Total score = sum of the activity scores/number of activities Minimum detectable change (90%CI) for average score = 2 points Minimum detectable change (90%CI) for single activity score = 3 points  SCREENING FOR RED FLAGS: 05/16/2024 Bowel or bladder incontinence: No Cauda equina syndrome: No  COGNITION: 05/16/2024 Overall cognitive status: WFL normal      SENSATION: 05/16/2024 [facility]  MUSCLE LENGTH: 05/16/2024 Pain limited positioning for ely's or thomas testing for Lt hip.   POSTURE:  05/16/2024 Reduced lumbar lordosis in standing with mild forward trunk lean.  Seated positioning in interview was flexion with leaning on elbows on knees with frequent body shifts.   PALPATION: 05/16/2024 Tenderness anterior hip, lateral hip Lt > Rt.  Tenderness on lateral aspects of iliac crest bilateral   LUMBAR ROM:  05/16/2024 Directional Preference Assessment: Centralization: none observed Peripheralization: none observed  AROM Eval 05/16/2024  Flexion To toes without complaints  Extension 50% WFL with back pain noted, pulling in Lt anterior hip  Right lateral flexion   Left lateral flexion   Right rotation   Left rotation    (Blank rows = not tested)  LOWER EXTREMITY ROM:      Right Eval 05/16/2024  Left Eval 05/16/2024  Hip flexion No pain noted, WFL Pain with active hip flexion  Hip extension    Hip abduction    Hip adduction    Hip internal rotation    Hip external rotation    Knee flexion    Knee extension  Ankle dorsiflexion    Ankle plantarflexion    Ankle inversion    Ankle eversion     (Blank rows = not tested)  LOWER EXTREMITY MMT:    MMT Right Eval 05/16/2024 Left Eval 05/16/2024  Hip flexion 5/5 3+/5 c pain anterior Lt hip  Hip extension Unable to test due to bed mobility troubles. Unable to test due to bed mobility troubles.  Hip abduction Unable to test due to bed mobility troubles.  2-/5 c pain  Hip adduction    Hip internal rotation    Hip external rotation    Knee flexion 5/5 5/5  Knee extension 5/5 4+/5  Ankle dorsiflexion 5/5 5/5  Ankle plantarflexion    Ankle inversion    Ankle eversion     (Blank rows = not tested)  SPECIAL TESTS:  05/16/2024 (-) slump bilateral   FUNCTIONAL TESTS:  05/16/2024 18 inch chair transfers: unable s UE assist.  20 inch table height: able without UE assist.  Lt SLS = unable s assist.  Rt SLS 5 seconds  GAIT: 05/16/2024 SPC use in Rt UE with flexion trunk, reduced hip extension bilateral with reduced step lengths.                                                                                                                                                                                                                   TODAY'S TREATMENT:                                                                                                         DATE: 05/16/2024  Therex:    HEP instruction/performance c cues for techniques, handout provided.  Trial set performed of each for comprehension and symptom assessment.  See below for exercise list.  TherActivity Supine to sit to supine transfer education verbally with trial in bed mobility.  Advice for long rolling and positioning to improve mechanics.    Neuro Re-ed Pain  neuroscience education given regarding nervous system elevation/sensitivity increases and how that can impact sensation and response to activity. Detailed importance  of graded exercise (HEP, aerobic exercise options) and mobility during the day to promote reduced sensitivity.  Question and answer time given for improved understanding.    PATIENT EDUCATION:  05/16/2024 Education details: HEP, POC Person educated: Patient Education method: Programmer, multimedia, Demonstration, Verbal cues, and Handouts Education comprehension: verbalized understanding, returned demonstration, and verbal cues required  HOME EXERCISE PROGRAM: Access Code: [email or web address] URL: [email or web address] Date: 05/16/2024 Prepared by: Bonna Bustard  Exercises - Supine Bridge  - 1-2 x daily - 7 x weekly - 1-2 sets - 10 reps - 2 hold - Clamshell  - 1-2 x daily - 7 x weekly - 2-3 sets - 10 reps - Seated March  - 1-2 x daily - 7 x weekly - 3 sets - 10 reps - Seated Quad Set  - 2-3 x daily - 7 x weekly - 1 sets - 10 reps - 5 hold - Standing Lumbar Extension with Counter  - 2-3 x daily - 7 x weekly - 1 sets - 5-10 reps  ASSESSMENT:  CLINICAL IMPRESSION: Patient is a 63 y.o. who comes to clinic with complaints of bilateral hip, back pain with mobility, strength and movement coordination deficits that impair their ability to perform usual daily and recreational functional activities without increase difficulty/symptoms at this time.  Patient to benefit from skilled PT services to address impairments and limitations to improve to previous level of function without restriction secondary to condition.   Also recommended follow up with primary MD about weight loss, eating habits and discussion about chest/rib tenderness.   OBJECTIVE IMPAIRMENTS: Abnormal gait, decreased activity tolerance, decreased balance, decreased coordination, decreased endurance, decreased mobility, difficulty walking, decreased ROM, decreased strength,  hypomobility, increased fascial restrictions, impaired perceived functional ability, increased muscle spasms, impaired flexibility, improper body mechanics, postural dysfunction, and pain.   ACTIVITY LIMITATIONS: carrying, lifting, bending, sitting, standing, squatting, sleeping, stairs, transfers, bed mobility, bathing, toileting, dressing, reach over head, and locomotion level  PARTICIPATION LIMITATIONS: meal prep, cleaning, laundry, interpersonal relationship, driving, shopping, community activity, occupation, and yard work  PERSONAL FACTORS: Arthritis, HTN, history of breast cancer  are also affecting patient's functional outcome.   REHAB POTENTIAL: Good  CLINICAL DECISION MAKING: Stable/uncomplicated  EVALUATION COMPLEXITY: Low   GOALS: Goals reviewed with patient? Yes  SHORT TERM GOALS: (target date for Short term goals are 3 weeks 06/06/2024)  1. Patient will demonstrate independent use of home exercise program to maintain progress from in clinic treatments.  Goal status: New  LONG TERM GOALS: (target dates for all long term goals are 10 weeks  07/25/2024 )   1. Patient will demonstrate/report pain at worst less than or equal to 2/10 to facilitate minimal limitation in daily activity secondary to pain symptoms.  Goal status: New   2. Patient will demonstrate independent use of home exercise program to facilitate ability to maintain/progress functional gains from skilled physical therapy services.  Goal status: New   3. Patient will demonstrate Patient specific functional scale avg > or = 8/10 to indicate reduced disability due to condition.   Goal status: New   4. Patient will demonstrate lumbar extension 100 % WFL s symptoms to facilitate upright standing, walking posture at PLOF s limitation.  Goal status: New   5.  Patient will demonstrate bilateral hip abduction > 4/5, hip flexion 5/5, knee extension 5/5 to facilitate usual mobility in daily life at PLOF.   Goal  status: New   6.  Patient will demonstrate bilateral SLS > 10 seconds for improved stability in  ambulation.  Goal status: New   7.  Patient will demonstrate ascending/descending stairs reciprocally s UE assist for house/community integration.   Goal Status: New  8. Patient will return to work at [employer].   PLAN:  PT FREQUENCY: 1-2x/week  PT DURATION: 10 weeks  PLANNED INTERVENTIONS: Can include 16109- PT Re-evaluation, 97110-Therapeutic exercises, 97530- Therapeutic activity, 97112- Neuromuscular re-education, 97535- Self Care, 97140- Manual therapy, [phone]- Gait training, [phone]- Orthotic Fit/training, [phone]- Canalith repositioning, [id number]- Aquatic Therapy, [phone]- Electrical stimulation (unattended), 97750 Physical performance testing, [id number]- Electrical stimulation (manual), 97016- Vasopneumatic device, [id number]- Ultrasound, [id number]- Traction (mechanical), [id number]- Ionotophoresis 4mg /ml Dexamethasone , Patient/Family education, Balance training, Stair training, Taping, Dry Needling, Joint mobilization, Joint manipulation, Spinal manipulation, Spinal mobilization, Scar mobilization, Vestibular training, Visual/preceptual remediation/compensation, DME instructions, Cryotherapy, and Moist heat.  All performed as medically necessary.  All included unless contraindicated  PLAN FOR NEXT SESSION: Review HEP knowledge/results.  Progressive mobility and strength improvements.      Bonna Bustard, PT, DPT, OCS, ATC 05/16/24  3:12 PM

## 2024-05-22 ENCOUNTER — Ambulatory Visit (HOSPITAL_BASED_OUTPATIENT_CLINIC_OR_DEPARTMENT_OTHER)
Admission: RE | Admit: 2024-05-22 | Discharge: 2024-05-22 | Disposition: A | Discharge status: Home / Self Care | Source: Ambulatory Visit | Attending: Family Medicine | Admitting: Family Medicine

## 2024-05-22 ENCOUNTER — Other Ambulatory Visit: Payer: Self-pay | Admitting: Family Medicine

## 2024-05-22 ENCOUNTER — Other Ambulatory Visit: Payer: Self-pay

## 2024-05-22 ENCOUNTER — Encounter (HOSPITAL_COMMUNITY): Payer: Self-pay

## 2024-05-22 ENCOUNTER — Other Ambulatory Visit (HOSPITAL_COMMUNITY): Payer: Self-pay

## 2024-05-22 ENCOUNTER — Emergency Department (HOSPITAL_COMMUNITY)

## 2024-05-22 ENCOUNTER — Inpatient Hospital Stay (HOSPITAL_COMMUNITY)
Admission: EM | Admit: 2024-05-22 | Discharge: 2024-06-09 | DRG: 829 | Disposition: A | Discharge status: Hospice | Attending: Internal Medicine | Admitting: Internal Medicine

## 2024-05-22 DIAGNOSIS — N179 Acute kidney failure, unspecified: Secondary | ICD-10-CM | POA: Diagnosis present

## 2024-05-22 DIAGNOSIS — Z515 Encounter for palliative care: Secondary | ICD-10-CM | POA: Diagnosis not present

## 2024-05-22 DIAGNOSIS — M5431 Sciatica, right side: Secondary | ICD-10-CM | POA: Diagnosis present

## 2024-05-22 DIAGNOSIS — Z96643 Presence of artificial hip joint, bilateral: Secondary | ICD-10-CM | POA: Diagnosis not present

## 2024-05-22 DIAGNOSIS — Z79811 Long term (current) use of aromatase inhibitors: Secondary | ICD-10-CM

## 2024-05-22 DIAGNOSIS — D024 Carcinoma in situ of respiratory system, unspecified: Secondary | ICD-10-CM | POA: Diagnosis not present

## 2024-05-22 DIAGNOSIS — M549 Dorsalgia, unspecified: Secondary | ICD-10-CM | POA: Diagnosis not present

## 2024-05-22 DIAGNOSIS — F17210 Nicotine dependence, cigarettes, uncomplicated: Secondary | ICD-10-CM | POA: Diagnosis not present

## 2024-05-22 DIAGNOSIS — M8440XA Pathological fracture, unspecified site, initial encounter for fracture: Secondary | ICD-10-CM

## 2024-05-22 DIAGNOSIS — Z79899 Other long term (current) drug therapy: Secondary | ICD-10-CM | POA: Diagnosis not present

## 2024-05-22 DIAGNOSIS — R296 Repeated falls: Secondary | ICD-10-CM | POA: Diagnosis not present

## 2024-05-22 DIAGNOSIS — M25552 Pain in left hip: Secondary | ICD-10-CM | POA: Diagnosis not present

## 2024-05-22 DIAGNOSIS — K5900 Constipation, unspecified: Secondary | ICD-10-CM | POA: Diagnosis present

## 2024-05-22 DIAGNOSIS — C7B8 Other secondary neuroendocrine tumors: Secondary | ICD-10-CM | POA: Diagnosis not present

## 2024-05-22 DIAGNOSIS — Z7982 Long term (current) use of aspirin: Secondary | ICD-10-CM

## 2024-05-22 DIAGNOSIS — R918 Other nonspecific abnormal finding of lung field: Secondary | ICD-10-CM

## 2024-05-22 DIAGNOSIS — M4316 Spondylolisthesis, lumbar region: Secondary | ICD-10-CM | POA: Diagnosis not present

## 2024-05-22 DIAGNOSIS — T380X5A Adverse effect of glucocorticoids and synthetic analogues, initial encounter: Secondary | ICD-10-CM | POA: Diagnosis present

## 2024-05-22 DIAGNOSIS — Z751 Person awaiting admission to adequate facility elsewhere: Secondary | ICD-10-CM

## 2024-05-22 DIAGNOSIS — M159 Polyosteoarthritis, unspecified: Secondary | ICD-10-CM | POA: Diagnosis not present

## 2024-05-22 DIAGNOSIS — M545 Low back pain, unspecified: Secondary | ICD-10-CM | POA: Diagnosis not present

## 2024-05-22 DIAGNOSIS — Z801 Family history of malignant neoplasm of trachea, bronchus and lung: Secondary | ICD-10-CM

## 2024-05-22 DIAGNOSIS — Z8249 Family history of ischemic heart disease and other diseases of the circulatory system: Secondary | ICD-10-CM

## 2024-05-22 DIAGNOSIS — R590 Localized enlarged lymph nodes: Secondary | ICD-10-CM | POA: Diagnosis not present

## 2024-05-22 DIAGNOSIS — F17290 Nicotine dependence, other tobacco product, uncomplicated: Secondary | ICD-10-CM | POA: Diagnosis present

## 2024-05-22 DIAGNOSIS — S32512A Fracture of superior rim of left pubis, initial encounter for closed fracture: Secondary | ICD-10-CM | POA: Diagnosis not present

## 2024-05-22 DIAGNOSIS — M898X9 Other specified disorders of bone, unspecified site: Secondary | ICD-10-CM | POA: Diagnosis not present

## 2024-05-22 DIAGNOSIS — R5381 Other malaise: Secondary | ICD-10-CM | POA: Diagnosis present

## 2024-05-22 DIAGNOSIS — M47816 Spondylosis without myelopathy or radiculopathy, lumbar region: Secondary | ICD-10-CM | POA: Diagnosis not present

## 2024-05-22 DIAGNOSIS — D72829 Elevated white blood cell count, unspecified: Secondary | ICD-10-CM | POA: Diagnosis not present

## 2024-05-22 DIAGNOSIS — C7A8 Other malignant neuroendocrine tumors: Secondary | ICD-10-CM | POA: Diagnosis present

## 2024-05-22 DIAGNOSIS — G893 Neoplasm related pain (acute) (chronic): Secondary | ICD-10-CM | POA: Diagnosis not present

## 2024-05-22 DIAGNOSIS — R0602 Shortness of breath: Secondary | ICD-10-CM | POA: Diagnosis not present

## 2024-05-22 DIAGNOSIS — Z6824 Body mass index (BMI) 24.0-24.9, adult: Secondary | ICD-10-CM | POA: Diagnosis not present

## 2024-05-22 DIAGNOSIS — M8448XA Pathological fracture, other site, initial encounter for fracture: Secondary | ICD-10-CM | POA: Diagnosis present

## 2024-05-22 DIAGNOSIS — C50912 Malignant neoplasm of unspecified site of left female breast: Secondary | ICD-10-CM | POA: Diagnosis not present

## 2024-05-22 DIAGNOSIS — I10 Essential (primary) hypertension: Secondary | ICD-10-CM | POA: Diagnosis not present

## 2024-05-22 DIAGNOSIS — R634 Abnormal weight loss: Secondary | ICD-10-CM | POA: Diagnosis not present

## 2024-05-22 DIAGNOSIS — Z48813 Encounter for surgical aftercare following surgery on the respiratory system: Secondary | ICD-10-CM | POA: Diagnosis not present

## 2024-05-22 DIAGNOSIS — Z170 Estrogen receptor positive status [ER+]: Secondary | ICD-10-CM | POA: Diagnosis not present

## 2024-05-22 DIAGNOSIS — C50412 Malignant neoplasm of upper-outer quadrant of left female breast: Secondary | ICD-10-CM | POA: Diagnosis not present

## 2024-05-22 DIAGNOSIS — M84454A Pathological fracture, pelvis, initial encounter for fracture: Secondary | ICD-10-CM | POA: Diagnosis not present

## 2024-05-22 DIAGNOSIS — Z66 Do not resuscitate: Secondary | ICD-10-CM | POA: Diagnosis not present

## 2024-05-22 DIAGNOSIS — C799 Secondary malignant neoplasm of unspecified site: Secondary | ICD-10-CM | POA: Diagnosis not present

## 2024-05-22 DIAGNOSIS — C7951 Secondary malignant neoplasm of bone: Secondary | ICD-10-CM | POA: Diagnosis not present

## 2024-05-22 DIAGNOSIS — Z7189 Other specified counseling: Secondary | ICD-10-CM

## 2024-05-22 DIAGNOSIS — Z923 Personal history of irradiation: Secondary | ICD-10-CM | POA: Diagnosis not present

## 2024-05-22 DIAGNOSIS — C50419 Malignant neoplasm of upper-outer quadrant of unspecified female breast: Secondary | ICD-10-CM | POA: Diagnosis present

## 2024-05-22 DIAGNOSIS — R4589 Other symptoms and signs involving emotional state: Secondary | ICD-10-CM | POA: Diagnosis not present

## 2024-05-22 DIAGNOSIS — E785 Hyperlipidemia, unspecified: Secondary | ICD-10-CM | POA: Diagnosis present

## 2024-05-22 DIAGNOSIS — R251 Tremor, unspecified: Secondary | ICD-10-CM | POA: Diagnosis not present

## 2024-05-22 DIAGNOSIS — J984 Other disorders of lung: Secondary | ICD-10-CM | POA: Diagnosis not present

## 2024-05-22 DIAGNOSIS — C7949 Secondary malignant neoplasm of other parts of nervous system: Secondary | ICD-10-CM | POA: Diagnosis not present

## 2024-05-22 DIAGNOSIS — M169 Osteoarthritis of hip, unspecified: Secondary | ICD-10-CM | POA: Diagnosis present

## 2024-05-22 DIAGNOSIS — B370 Candidal stomatitis: Secondary | ICD-10-CM | POA: Diagnosis not present

## 2024-05-22 DIAGNOSIS — M48061 Spinal stenosis, lumbar region without neurogenic claudication: Secondary | ICD-10-CM | POA: Diagnosis not present

## 2024-05-22 DIAGNOSIS — R739 Hyperglycemia, unspecified: Secondary | ICD-10-CM | POA: Diagnosis present

## 2024-05-22 DIAGNOSIS — M25551 Pain in right hip: Secondary | ICD-10-CM | POA: Diagnosis not present

## 2024-05-22 DIAGNOSIS — Z1721 Progesterone receptor positive status: Secondary | ICD-10-CM

## 2024-05-22 DIAGNOSIS — E876 Hypokalemia: Secondary | ICD-10-CM | POA: Diagnosis present

## 2024-05-22 DIAGNOSIS — E869 Volume depletion, unspecified: Secondary | ICD-10-CM | POA: Diagnosis present

## 2024-05-22 DIAGNOSIS — D0512 Intraductal carcinoma in situ of left breast: Secondary | ICD-10-CM | POA: Diagnosis not present

## 2024-05-22 DIAGNOSIS — Z888 Allergy status to other drugs, medicaments and biological substances status: Secondary | ICD-10-CM

## 2024-05-22 LAB — CBC WITH DIFFERENTIAL/PLATELET
Abs Immature Granulocytes: 0.1 10*3/uL — ABNORMAL HIGH (ref 0.00–0.07)
Basophils Absolute: 0 10*3/uL (ref 0.0–0.1)
Basophils Relative: 0 %
Eosinophils Absolute: 0.3 10*3/uL (ref 0.0–0.5)
Eosinophils Relative: 3 %
HCT: 37.5 % (ref 36.0–46.0)
Hemoglobin: 12.8 g/dL (ref 12.0–15.0)
Immature Granulocytes: 1 %
Lymphocytes Relative: 10 %
Lymphs Abs: 0.9 10*3/uL (ref 0.7–4.0)
MCH: 29.1 pg (ref 26.0–34.0)
MCHC: 34.1 g/dL (ref 30.0–36.0)
MCV: 85.2 fL (ref 80.0–100.0)
Monocytes Absolute: 1.1 10*3/uL — ABNORMAL HIGH (ref 0.1–1.0)
Monocytes Relative: 12 %
Neutro Abs: 6.6 10*3/uL (ref 1.7–7.7)
Neutrophils Relative %: 74 %
Platelets: 351 10*3/uL (ref 150–400)
RBC: 4.4 MIL/uL (ref 3.87–5.11)
RDW: 11.9 % (ref 11.5–15.5)
WBC: 9 10*3/uL (ref 4.0–10.5)
nRBC: 0 % (ref 0.0–0.2)

## 2024-05-22 LAB — COMPREHENSIVE METABOLIC PANEL WITH GFR
ALT: 27 U/L (ref 0–44)
AST: 21 U/L (ref 15–41)
Albumin: 3.3 g/dL — ABNORMAL LOW (ref 3.5–5.0)
Alkaline Phosphatase: 151 U/L — ABNORMAL HIGH (ref 38–126)
Anion gap: 11 (ref 5–15)
BUN: 40 mg/dL — ABNORMAL HIGH (ref 8–23)
CO2: 29 mmol/L (ref 22–32)
Calcium: >15 mg/dL (ref 8.9–10.3)
Chloride: 90 mmol/L — ABNORMAL LOW (ref 98–111)
Creatinine, Ser: 2.74 mg/dL — ABNORMAL HIGH (ref 0.44–1.00)
GFR, Estimated: 19 mL/min — ABNORMAL LOW (ref >60)
Glucose, Bld: 118 mg/dL — ABNORMAL HIGH (ref 70–99)
Potassium: 3.1 mmol/L — ABNORMAL LOW (ref 3.5–5.1)
Sodium: 130 mmol/L — ABNORMAL LOW (ref 135–145)
Total Bilirubin: 0.8 mg/dL (ref 0.0–1.2)
Total Protein: 7.5 g/dL (ref 6.5–8.1)

## 2024-05-22 MED ORDER — ONDANSETRON HCL 4 MG/2ML IJ SOLN
4.0000 mg | Freq: Once | INTRAMUSCULAR | Status: AC
  Administered 2024-05-22: 4 mg via INTRAVENOUS
  Filled 2024-05-22: qty 2

## 2024-05-22 MED ORDER — SODIUM CHLORIDE 0.9 % IV BOLUS
1000.0000 mL | Freq: Once | INTRAVENOUS | Status: AC
  Administered 2024-05-22: 1000 mL via INTRAVENOUS

## 2024-05-22 MED ORDER — TRAMADOL HCL 50 MG PO TABS
50.0000 mg | ORAL_TABLET | Freq: Four times a day (QID) | ORAL | 0 refills | Status: DC | PRN
Start: 2024-05-22 — End: 2024-06-09
  Filled 2024-05-22: qty 40, 10d supply, fill #0

## 2024-05-22 MED ORDER — HEPARIN SODIUM (PORCINE) 5000 UNIT/ML IJ SOLN
5000.0000 [IU] | Freq: Three times a day (TID) | INTRAMUSCULAR | Status: DC
  Administered 2024-05-23: 5000 [IU] via SUBCUTANEOUS
  Filled 2024-05-22: qty 1

## 2024-05-22 MED ORDER — AMLODIPINE BESYLATE 5 MG PO TABS
5.0000 mg | ORAL_TABLET | Freq: Every day | ORAL | Status: DC
  Administered 2024-05-23 – 2024-06-09 (×18): 5 mg via ORAL
  Filled 2024-05-22 (×18): qty 1

## 2024-05-22 MED ORDER — CALCITONIN (SALMON) 200 UNIT/ML IJ SOLN
4.0000 [IU]/kg | Freq: Two times a day (BID) | INTRAMUSCULAR | Status: AC
  Administered 2024-05-22 – 2024-05-23 (×3): 280 [IU] via INTRAMUSCULAR
  Filled 2024-05-22 (×4): qty 1.4

## 2024-05-22 MED ORDER — ZOLEDRONIC ACID 4 MG/100ML IV SOLN
4.0000 mg | Freq: Once | INTRAVENOUS | Status: AC
  Administered 2024-05-22: 4 mg via INTRAVENOUS
  Filled 2024-05-22: qty 100

## 2024-05-22 MED ORDER — METOPROLOL SUCCINATE ER 50 MG PO TB24
150.0000 mg | ORAL_TABLET | Freq: Every day | ORAL | Status: DC
  Administered 2024-05-23 – 2024-06-09 (×18): 150 mg via ORAL
  Filled 2024-05-22: qty 1
  Filled 2024-05-22: qty 3
  Filled 2024-05-22 (×2): qty 1
  Filled 2024-05-22: qty 3
  Filled 2024-05-22 (×2): qty 1
  Filled 2024-05-22 (×3): qty 3
  Filled 2024-05-22: qty 1
  Filled 2024-05-22 (×4): qty 3
  Filled 2024-05-22 (×2): qty 1
  Filled 2024-05-22: qty 3

## 2024-05-22 MED ORDER — HYDROMORPHONE HCL 1 MG/ML IJ SOLN
1.0000 mg | Freq: Once | INTRAMUSCULAR | Status: AC
  Administered 2024-05-22: 1 mg via INTRAVENOUS
  Filled 2024-05-22: qty 1

## 2024-05-22 MED ORDER — CLOTRIMAZOLE 10 MG MT TROC
10.0000 mg | OROMUCOSAL | 0 refills | Status: DC
  Filled 2024-05-22: qty 70, 14d supply, fill #0

## 2024-05-22 MED ORDER — IOHEXOL 300 MG/ML  SOLN
100.0000 mL | Freq: Once | INTRAMUSCULAR | Status: AC | PRN
  Administered 2024-05-22: 80 mL via INTRAVENOUS

## 2024-05-22 MED ORDER — POTASSIUM CHLORIDE CRYS ER 20 MEQ PO TBCR
40.0000 meq | EXTENDED_RELEASE_TABLET | ORAL | Status: AC
  Administered 2024-05-22: 40 meq via ORAL
  Filled 2024-05-22: qty 2

## 2024-05-22 MED ORDER — SODIUM CHLORIDE 0.9 % IV SOLN: INTRAVENOUS | Status: DC

## 2024-05-22 MED ORDER — HYDROMORPHONE HCL 1 MG/ML IJ SOLN: 0.5000 mg | Freq: Once | INTRAMUSCULAR | Status: DC

## 2024-05-22 NOTE — ED Triage Notes (Addendum)
 Pt came in after her oncologist stated she needed fluids after realizing her calcium  was high.

## 2024-05-22 NOTE — H&P (Incomplete)
 History and Physical    Angela Lin XLK:[id number] DOB: 1961/10/21 DOA: 05/22/2024  Patient coming from: Home.  Chief Complaint: Abnormal labs.  HPI: Angela Lin is a 63 y.o. female with history of breast cancer status postlumpectomy radiation and is on letrozole  since April 2023 has been experiencing low back pain for the last 3 to 4 weeks.  Denies any incontinence of urine or bowel.  Had followed up with orthopedics recently.  Patient had labs drawn which was abnormal and was advised to come to the ER.  Patient states her low back pain was progressively worsening and radiating to right lower extremity.  Denies nausea vomiting fever or chills.  ED Course: In the ER patient labs showed calcium  more than 15.  Creatinine has worsened and it is around 2.7 last one was in 2019 which was normal.  No recent labs available.  CT chest abdomen pelvis was done which shows worsening metastatic lesion including T7 compression fracture and also significant spinal canal and neural aminal narrowing in the lumbar area.  There also also subacute fracture of the left superior rami and destructive lesion of the left inferior rami with metastasis.  Patient's oncologist Dr. Lee Public was consulted.  Dr. Larrie Po of neurosurgery also was consulted.  As per neurosurgery no acute intervention but to consult oncology.  For the hypercalcemia patient was started on fluids, zoledronate and calcitonin.  Review of Systems: As per HPI, rest all negative.   Past Medical History:  Diagnosis Date   Arthritis    osteoarthritis- hip   Endometriosis    Heart murmur    as a child, reports as benign    Hypertension     Past Surgical History:  Procedure Laterality Date   BREAST BIOPSY Left 03/22/2022   lt 12:00 venus clip path pending   BREAST LUMPECTOMY Left 05/26/2022   Procedure: BREAST LUMPECTOMY WITH EXCISION OF SENTINEL NODE;  Surgeon: Caralyn Chandler, MD;  Location: [facility];  Service:  General;  Laterality: Left;  GEN & PEC BLOCK   DIAGNOSTIC LAPAROSCOPY     endometriosis- 20 yrs ago   TOTAL HIP ARTHROPLASTY Right 06/04/2016   Procedure: RIGHT TOTAL HIP ARTHROPLASTY ANTERIOR APPROACH;  Surgeon: Arnie Lao, MD;  Location: [facility];  Service: Orthopedics;  Laterality: Right;   TOTAL HIP ARTHROPLASTY Left 12/08/2018   Procedure: LEFT TOTAL HIP ARTHROPLASTY ANTERIOR APPROACH;  Surgeon: Arnie Lao, MD;  Location: [facility];  Service: Orthopedics;  Laterality: Left;     reports that she has been smoking cigarettes. She has a 20 pack-year smoking history. She has never used smokeless tobacco. She reports current alcohol use. She reports that she does not use drugs.  Allergies  Allergen Reactions   Hydrochlorothiazide Other (See Comments)    Family History  Problem Relation Age of Onset   Cancer Father        Lung cancer- passed 2005    Hypertension Brother     Prior to Admission medications   Medication Sig Start Date End Date Taking? Authorizing Provider  amLODipine  (NORVASC ) 5 MG tablet Take 1 tablet (5 mg total) by mouth daily. 04/18/24     amoxicillin -clavulanate (AUGMENTIN ) 875-125 MG tablet Take 1 tablet by mouth every 12 (twelve) hours. 02/23/24   Raspet, Betsey Brow, PA-C  aspirin  81 MG chewable tablet Chew 81 mg by mouth daily.    [provider]  atorvastatin  (LIPITOR) 10 MG tablet Take 1 tablet (10 mg total) by mouth daily. 10/03/23  atorvastatin  (LIPITOR) 10 MG tablet Take 1 tablet (10 mg total) by mouth daily. 01/03/24     Biotin w/ Vitamins C & E (HAIR/SKIN/NAILS PO) Take 1 tablet by mouth daily.    [provider]  Cholecalciferol  (VITAMIN D3 PO) Take by mouth.    [provider]  clotrimazole (MYCELEX) 10 MG troche Take 1 tablet (10 mg total) by mouth five times a day while on immunosuppressive medicine 05/22/24     cyclobenzaprine  (FLEXERIL ) 5 MG tablet Take 1 tablet (5 mg total) by mouth at bedtime as needed for  muscle spasms. 05/01/24   Sandie Cross, PA-C  diphenhydrAMINE  (BENADRYL ) 25 mg capsule Take 25 mg by mouth at bedtime.    [provider]  fluticasone  (FLONASE ) 50 MCG/ACT nasal spray Place 2 sprays into both nostrils daily. 03/23/23     Glucosamine HCl-MSM (GLUCOSAMINE-MSM PO) Take 2 tablets by mouth daily.    [provider]  letrozole  (FEMARA ) 2.5 MG tablet Take 1 tablet (2.5 mg total) by mouth daily. 08/12/22   Lin, Vinay, MD  losartan  (COZAAR ) 100 MG tablet Take 1 tablet (100 mg total) by mouth daily. 04/18/24     metoprolol  succinate (TOPROL -XL) 100 MG 24 hr tablet Take 1.5 tablets (150 mg total) by mouth daily. 04/17/24     milk thistle 175 MG tablet Take 175 mg by mouth 2 (two) times daily.     [provider]  naproxen sodium (ALEVE) 220 MG tablet Take 220 mg by mouth daily as needed.    [provider]  predniSONE  (STERAPRED UNI-PAK 21 TAB) 10 MG (21) TBPK tablet Take as directed per package instructions 05/01/24   Sandie Cross, PA-C  tetrahydrozoline 0.05 % ophthalmic solution Place 2 drops into both eyes daily.    [provider]  tiZANidine  (ZANAFLEX ) 4 MG tablet Take 1 tablet (4 mg total) by mouth at bedtime as needed. 12/19/23     traMADol  (ULTRAM ) 50 MG tablet Take 1 tablet (50 mg total) by mouth every 6 (six) hours as needed for 10 days 05/22/24     Varenicline  Tartrate,Continue, 1 MG TABS Take 1 tablet by mouth 2 (two) times daily. 03/23/23       Physical Exam: Constitutional: Moderately built and nourished. Vitals:   05/22/24 1634 05/22/24 1946  BP: (!) 113/97 126/62  Pulse: 69 69  Resp: 18 18  Temp: 97.8 F (36.6 C) 97.9 F (36.6 C)  TempSrc: Oral Oral  SpO2: 100% 96%   Eyes: Anicteric no pallor. ENMT: No discharge from the ears eyes nose and mouth. Neck: No mass felt.  No neck rigidity.Aaron Aas   Respiratory: No rhonchi or crepitations. Cardiovascular: S1-S2 heard. Abdomen: Soft nontender bowel sound  present. Musculoskeletal: No edema. Skin: No rash. Neurologic: Alert awake oriented time place and person.  Able to move all extremities in the lying down position. Psychiatric: Appears normal.  Normal affect.   Labs on Admission: I have personally reviewed following labs and imaging studies  CBC: Recent Labs  Lab 05/22/24 1736  WBC 9.0  NEUTROABS 6.6  HGB 12.8  HCT 37.5  MCV 85.2  PLT 351   Basic Metabolic Panel: Recent Labs  Lab 05/22/24 1736  NA 130*  K 3.1*  CL 90*  CO2 29  GLUCOSE 118*  BUN 40*  CREATININE 2.74*  CALCIUM  >15.0*   GFR: CrCl cannot be calculated (Unknown ideal weight.). Liver Function Tests: Recent Labs  Lab 05/22/24 1736  AST 21  ALT 27  ALKPHOS  151*  BILITOT 0.8  PROT 7.5  ALBUMIN 3.3*   No results for input(s): "LIPASE", "AMYLASE" in the last 168 hours. No results for input(s): "AMMONIA" in the last 168 hours. Coagulation Profile: No results for input(s): "INR", "PROTIME" in the last 168 hours. Cardiac Enzymes: No results for input(s): "CKTOTAL", "CKMB", "CKMBINDEX", "TROPONINI" in the last 168 hours. BNP (last 3 results) No results for input(s): "PROBNP" in the last 8760 hours. HbA1C: No results for input(s): "HGBA1C" in the last 72 hours. CBG: No results for input(s): "GLUCAP" in the last 168 hours. Lipid Profile: No results for input(s): "CHOL", "HDL", "LDLCALC", "TRIG", "CHOLHDL", "LDLDIRECT" in the last 72 hours. Thyroid Function Tests: No results for input(s): "TSH", "T4TOTAL", "FREET4", "T3FREE", "THYROIDAB" in the last 72 hours. Anemia Panel: No results for input(s): "VITAMINB12", "FOLATE", "FERRITIN", "TIBC", "IRON", "RETICCTPCT" in the last 72 hours. Urine analysis:    Component Value Date/Time   BILIRUBINUR n 11/04/2016 0906   PROTEINUR n 11/04/2016 0906   UROBILINOGEN negative 11/04/2016 0906   NITRITE n 11/04/2016 0906   LEUKOCYTESUR Negative 11/04/2016 0906   Sepsis  Labs: @LABRCNTIP (procalcitonin:4,lacticidven:4) )No results found for this or any previous visit (from the past 240 hours).   Radiological Exams on Admission: CT Renal Stone Study Result Date: 05/22/2024 CLINICAL DATA:  R flank pain, recent cancer diagnosis, aki EXAM: CT ABDOMEN AND PELVIS WITHOUT CONTRAST TECHNIQUE: Multidetector CT imaging of the abdomen and pelvis was performed following the standard protocol without IV contrast. RADIATION DOSE REDUCTION: This exam was performed according to the departmental dose-optimization program which includes automated exposure control, adjustment of the mA and/or kV according to patient size and/or use of iterative reconstruction technique. COMPARISON:  None Available. FINDINGS: Of note, the lack of intravenous contrast limits evaluation of the solid organ parenchyma and vascularity. Lower chest: For findings above the diaphragm, please see the separately dictated CT of the chest report, which was performed concurrently. Hepatobiliary: Subcentimeter hypodensity in the right hepatic lobe measuring 5 mm (axial 9). Left hepatic lobe hypodensity measuring 9 mm (axial 9). Segment 4 hypodensity measuring 1.1 cm (axial 17). 1.3 cm hypodensity abutting the gallbladder fossa in the right hepatic lobe inferiorly (axial 27).Focal fatty infiltration along the falciform ligament.Layering biliary sludge or punctate gallstones. No gallbladder wall thickening. No intrahepatic or extrahepatic biliary ductal dilation. Pancreas: No mass or main ductal dilation. No peripancreatic inflammation or fluid collection. Spleen: Normal size. No mass. Adrenals/Urinary Tract: No adrenal mass. Large cyst in the left upper pole. No hydronephrosis or nephrolithiasis. The urinary bladder is obscured by metallic streak artifact by both hip arthroplasties. Stomach/Bowel:Likely fluid-filled diverticulum arising from the posterior stomach (axial 11). The stomach contains ingested material without focal  abnormality. No small bowel wall thickening or inflammation. No small bowel obstruction.Normal appendix. Vascular/Lymphatic: No aortic aneurysm. Diffuse aortoiliac atherosclerosis. No intraabdominal or pelvic lymphadenopathy. Reproductive: The pelvic organs are obscured by metallic streak artifact from both hip arthroplasties. No free pelvic fluid. Other: No pneumoperitoneum, ascites, or mesenteric inflammation. Musculoskeletal: Destructive soft tissue mass in the right pedicle of L1, measuring 3.5 cm, encroaching into the neural foramen and causing significant spinal canal narrowing (axial 23). A smaller destructive mass in the right pedicle of the L4 vertebral body measures 1.7 cm, encroaching into the right neural foramen and the lateral right spinal canal (axial 39). 2.7 cm lytic lesion in the right L2 vertebral body. Both hip arthroplasties are anatomically aligned without dislocation. Age-indeterminate, fracture of the left superior pubic ramus at the puboacetabular junction. Large  destructive mass filling the inferior left pubic ramus measuring 3.5 cm. IMPRESSION: 1. Multiple small hypodensities in the liver measuring up to 1.3 cm in the inferior right hepatic lobe (axial 27), worrisome for metastatic disease. 2. Diffuse bony metastatic disease throughout the abdomen and pelvis. For example, there is a destructive soft tissue mass in the right L1 pedicle, measuring 3.5 cm, causing significant spinal canal and neuroforaminal narrowing (axial 23). 3. Age-indeterminate, but favored to be subacute, fracture of the left superior pubic ramus at the puboacetabular junction. Large destructive mass filling the inferior left pubic ramus, measuring 3.5 cm. Electronically Signed   By: Rance Burrows M.D.   On: 05/22/2024 19:26   CT CHEST WO CONTRAST Result Date: 05/22/2024 CLINICAL DATA:  Mass of upper lobe of right lung. Abnormal x-ray. History of left-sided breast cancer. Current smoker. EXAM: CT CHEST WITHOUT  CONTRAST TECHNIQUE: Multidetector CT imaging of the chest was performed following the standard protocol without IV contrast. RADIATION DOSE REDUCTION: This exam was performed according to the departmental dose-optimization program which includes automated exposure control, adjustment of the mA and/or kV according to patient size and/or use of iterative reconstruction technique. COMPARISON:  Chest radiograph 02/17/2024 FINDINGS: Cardiovascular: The heart is normal in size. There are coronary artery calcifications. No pericardial effusion. Moderate aortic atherosclerosis. No aortic aneurysm. Mediastinum/Nodes: Pathologic mediastinal adenopathy including a 19 mm short axis right paratracheal node series 2, image 54. There additional prominent mediastinal lymph nodes. Hilar assessment is limited in the absence of IV contrast. Unremarkable appearance of the esophagus. Lungs/Pleura: Right upper lobe mass measures 5.8 x 5.7 cm, series 3, image 76. This causes bowing of the minor fissure. Centrally this lesion abuts the mediastinal fat with loss of fat plane. This is increased in size from prior chest radiograph. 3 mm left upper lobe nodule series 3, image 39. Mild emphysema. Mild subpleural reticulation within the dependent lower lobes. Mild retained secretions in the central airways. No pleural effusion or thickening. Upper Abdomen: Posterior gastric diverticulum. No adrenal nodule. 6.3 cm fluid density lesion arising from the lateral left kidney is likely cyst, although incompletely included in the field of view. The liver appears mildly heterogeneous but no discrete lesion is seen. Musculoskeletal: There are multiple lytic lesions throughout the ribs and spine consistent with osseous metastatic disease. Lytic lesion within T7 has associated mild pathologic compression fracture as well as loss of posterior cortical margin and possible extraosseous soft tissue component. There are lesions throughout multiple thoracic  vertebra, posterior elements and many ribs. Fractures of lateral lower left ribs may be in part pathologic, and are likely subacute with surrounding callus formation. The largest rib lesion involves posterior left ninth rib, expansile with soft tissue component. Bilateral scapular lucent lesions with a fracture involving the inferior right scapular body. Surgical clips in the left breast and axilla. IMPRESSION: 1. A 5.8 cm right upper lobe mass, increased in size from prior chest radiograph, highly suspicious for primary lung malignancy. 2. Pathologic mediastinal adenopathy, typical of metastatic disease. 3. 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 There is a mild pathologic compression fracture of T7. Soft tissue component with loss of posterior cortex and possible extraosseous soft tissue component. Recommend thoracic spine MRI to assess spinal canal patency. Fractures of lateral lower left ribs may be in part pathologic, and are likely subacute with surrounding callus formation. There is a fracture involving the inferior right scapular body. 4. Coronary artery calcifications. Aortic Atherosclerosis (ICD10-I70.0) and Emphysema (ICD10-J43.9). These  results will be called to the ordering clinician or representative by the Radiologist Assistant, and communication documented in the PACS or [employer]. Electronically Signed   By: Chadwick Colonel M.D.   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  EKG is pending.  Will closely monitor in telemetry.  Follow metabolic panel. Acute renal failure could be from  hypercalcemia and dehydration.  Hold ARB.  CT abdomen does not show any definite obstruction.  Will closely follow intake output metabolic panel.  Replace potassium. Multiple metastatic lesions with prior history of breast cancer including a compression fracture in T7 and significant narrowing of the lumbar spine and neural aminal narrowing with multiple metastatic lesions in the chest abdomen and also subacute fracture of the left superior rami and destructive lesions of the left inferior rami.  Dr. Gudena patient's oncologist has been consulted.  Will await for further recommendation.  I have ordered MRI of the CT and L-spine.  On letrozole .  Addendum - I  have consulted orthopedics Luke Magnant from [facility] with regarding his multiple fractures seen in the pelvis. Hypertension will continue amlodipine  and metoprolol .  Hold ARB due to acute renal failure. History of hyperlipidemia on statins we will hold for now given the generalized weakness.  Since patient has severe hypercalcemia in the setting of malignancy with metastatic lesions will need close monitoring further workup and more than 2 midnight stay.  Discussed with oncologist Dr. Gudena.  Also with [facility].   DVT prophylaxis: Lovenox. Code Status: Full code as confirmed with patient. Family Communication: Unable to reach patient's mother as listed in the family contacts. Disposition Plan: Medical floor. Consults called: Neurosurgery and oncology.  Orthopedics Orth[facility].  Van Gelinas Magnant Admission status: Inpatient.

## 2024-05-22 NOTE — Consult Note (Signed)
 I was contacted by Dr. Efraim Grange regarding this patient.  She has is 63 year old white female with a history of breast cancer who presented to the ER with hypercalcemia.  She also complained of flank pain.  She was worked up with a chest abdomen and pelvis CT which demonstrated a lung lesion with numerous bony metastasis including in the thoracic and lumbar spine.  She is being admitted for further workup.   By report she is neurologically intact without weakness.   I recommended that oncology and radiation oncology be consulted and she be set up for palliative radiation therapy to her spine.  I do not have much to offer her surgically given her extensive metastasis.   Please call if I can be of further assistance.

## 2024-05-22 NOTE — ED Provider Notes (Signed)
 [place] EMERGENCY DEPARTMENT AT [facility] Provider Note   CSN: [id number] Arrival date & time: 05/22/24  1627     History  Chief Complaint  Patient presents with   Hydration   Abnormal Labs    Angela Lin is a 63 y.o. female.  63 year old female with a history of breast cancer and prior tobacco use who presents emergency department with flank pain, weight loss, and abnormal labs.  Patient reports that over the past month she has been having right-sided flank pain.  Initially attributed it to lifting things at work.  Says that has persisted.  Also has been having pains elsewhere and nausea.  Was seen by her primary doctor and had a CT scan of the chest today which did show right upper lung mass with multiple bony metastases and pathologic fractures of the ribs and scapula as well as T7.  Also found to have a calcium  that was elevated and was referred to the emergency department for evaluation.  Denies any bowel or bladder incontinence or weakness or numbness of her legs.       Home Medications Prior to Admission medications   Medication Sig Start Date End Date Taking? Authorizing Provider  amLODipine  (NORVASC ) 5 MG tablet Take 1 tablet (5 mg total) by mouth daily. 04/18/24     amoxicillin -clavulanate (AUGMENTIN ) 875-125 MG tablet Take 1 tablet by mouth every 12 (twelve) hours. 02/23/24   Raspet, Betsey Brow, PA-C  aspirin  81 MG chewable tablet Chew 81 mg by mouth daily.    [provider]  atorvastatin  (LIPITOR) 10 MG tablet Take 1 tablet (10 mg total) by mouth daily. 10/03/23     atorvastatin  (LIPITOR) 10 MG tablet Take 1 tablet (10 mg total) by mouth daily. 01/03/24     Biotin w/ Vitamins C & E (HAIR/SKIN/NAILS PO) Take 1 tablet by mouth daily.    [provider]  Cholecalciferol  (VITAMIN D3 PO) Take by mouth.    [provider]  clotrimazole  (MYCELEX ) 10 MG troche Take 1 tablet (10 mg total) by mouth five times a day while on  immunosuppressive medicine 05/22/24     cyclobenzaprine  (FLEXERIL ) 5 MG tablet Take 1 tablet (5 mg total) by mouth at bedtime as needed for muscle spasms. 05/01/24   Sandie Cross, PA-C  diphenhydrAMINE  (BENADRYL ) 25 mg capsule Take 25 mg by mouth at bedtime.    [provider]  fluticasone  (FLONASE ) 50 MCG/ACT nasal spray Place 2 sprays into both nostrils daily. 03/23/23     Glucosamine HCl-MSM (GLUCOSAMINE-MSM PO) Take 2 tablets by mouth daily.    [provider]  letrozole  (FEMARA ) 2.5 MG tablet Take 1 tablet (2.5 mg total) by mouth daily. 08/12/22   Gudena, Vinay, MD  losartan  (COZAAR ) 100 MG tablet Take 1 tablet (100 mg total) by mouth daily. 04/18/24     metoprolol  succinate (TOPROL -XL) 100 MG 24 hr tablet Take 1.5 tablets (150 mg total) by mouth daily. 04/17/24     milk thistle 175 MG tablet Take 175 mg by mouth 2 (two) times daily.     [provider]  naproxen sodium (ALEVE) 220 MG tablet Take 220 mg by mouth daily as needed.    [provider]  predniSONE  (STERAPRED UNI-PAK 21 TAB) 10 MG (21) TBPK tablet Take as directed per package instructions 05/01/24   Sandie Cross, PA-C  tetrahydrozoline 0.05 % ophthalmic solution Place 2 drops into both eyes daily.    [provider]  tiZANidine  (  ZANAFLEX ) 4 MG tablet Take 1 tablet (4 mg total) by mouth at bedtime as needed. 12/19/23     traMADol  (ULTRAM ) 50 MG tablet Take 1 tablet (50 mg total) by mouth every 6 (six) hours as needed for 10 days 05/22/24     Varenicline  Tartrate,Continue, 1 MG TABS Take 1 tablet by mouth 2 (two) times daily. 03/23/23         Allergies    Hydrochlorothiazide    Review of Systems   Review of Systems  Physical Exam Updated Vital Signs BP (!) 134/118 (BP Location: Left Arm)   Pulse 73   Temp 98 F (36.7 C)   Resp 18   LMP 08/27/2008 (Approximate)   SpO2 100%  Physical Exam Vitals and nursing note reviewed.  Constitutional:      General: She is not in acute  distress.    Appearance: She is well-developed.  HENT:     Head: Normocephalic and atraumatic.     Right Ear: External ear normal.     Left Ear: External ear normal.     Nose: Nose normal.  Eyes:     Extraocular Movements: Extraocular movements intact.     Conjunctiva/sclera: Conjunctivae normal.     Pupils: Pupils are equal, round, and reactive to light.  Cardiovascular:     Rate and Rhythm: Normal rate and regular rhythm.     Heart sounds: No murmur heard. Pulmonary:     Effort: Pulmonary effort is normal. No respiratory distress.     Breath sounds: Normal breath sounds.  Abdominal:     General: Abdomen is flat. There is no distension.     Palpations: Abdomen is soft. There is no mass.     Tenderness: There is no abdominal tenderness. There is no guarding.  Musculoskeletal:     Cervical back: Normal range of motion and neck supple.     Right lower leg: No edema.     Left lower leg: No edema.     Comments: No midline C-spine or lumbar tenderness to palpation. Midline thoracic spine ttp.   Motor: Muscle bulk and tone are normal. Strength is 5/5 in shoulder abduction, elbow flexion and extension, grip strength, hip flexion, knee flexion and extension, ankle dorsiflexion and plantar flexion bilaterally. Full strength of great toe dorsiflexion bilaterally.  Sensory: Intact sensation to light touch in C5-T1 dermatomes and L2 though S1 dermatomes bilaterally.   Patellar reflexes 2+ BL and no ankle clonus  Skin:    General: Skin is warm and dry.  Neurological:     Mental Status: She is alert and oriented to person, place, and time. Mental status is at baseline.  Psychiatric:        Mood and Affect: Mood normal.     ED Results / Procedures / Treatments   Labs (all labs ordered are listed, but only abnormal results are displayed) Labs Reviewed  CBC WITH DIFFERENTIAL/PLATELET - Abnormal; Notable for the following components:      Result Value   Monocytes Absolute 1.1 (*)    Abs  Immature Granulocytes 0.10 (*)    All other components within normal limits  COMPREHENSIVE METABOLIC PANEL WITH GFR - Abnormal; Notable for the following components:   Sodium 130 (*)    Potassium 3.1 (*)    Chloride 90 (*)    Glucose, Bld 118 (*)    BUN 40 (*)    Creatinine, Ser 2.74 (*)    Calcium  >15.0 (*)    Albumin 3.3 (*)  Alkaline Phosphatase 151 (*)    GFR, Estimated 19 (*)    All other components within normal limits  CALCIUM , IONIZED  PARATHYROID HORMONE, INTACT (NO CA)  PTH-RELATED PEPTIDE  HIV ANTIBODY (ROUTINE TESTING W REFLEX)  CBC  CREATININE, SERUM  COMPREHENSIVE METABOLIC PANEL WITH GFR  CBC    EKG None  Radiology CT Renal Stone Study Result Date: 05/22/2024 CLINICAL DATA:  R flank pain, recent cancer diagnosis, aki EXAM: CT ABDOMEN AND PELVIS WITHOUT CONTRAST TECHNIQUE: Multidetector CT imaging of the abdomen and pelvis was performed following the standard protocol without IV contrast. RADIATION DOSE REDUCTION: This exam was performed according to the departmental dose-optimization program which includes automated exposure control, adjustment of the mA and/or kV according to patient size and/or use of iterative reconstruction technique. COMPARISON:  None Available. FINDINGS: Of note, the lack of intravenous contrast limits evaluation of the solid organ parenchyma and vascularity. Lower chest: For findings above the diaphragm, please see the separately dictated CT of the chest report, which was performed concurrently. Hepatobiliary: Subcentimeter hypodensity in the right hepatic lobe measuring 5 mm (axial 9). Left hepatic lobe hypodensity measuring 9 mm (axial 9). Segment 4 hypodensity measuring 1.1 cm (axial 17). 1.3 cm hypodensity abutting the gallbladder fossa in the right hepatic lobe inferiorly (axial 27).Focal fatty infiltration along the falciform ligament.Layering biliary sludge or punctate gallstones. No gallbladder wall thickening. No intrahepatic or  extrahepatic biliary ductal dilation. Pancreas: No mass or main ductal dilation. No peripancreatic inflammation or fluid collection. Spleen: Normal size. No mass. Adrenals/Urinary Tract: No adrenal mass. Large cyst in the left upper pole. No hydronephrosis or nephrolithiasis. The urinary bladder is obscured by metallic streak artifact by both hip arthroplasties. Stomach/Bowel:Likely fluid-filled diverticulum arising from the posterior stomach (axial 11). The stomach contains ingested material without focal abnormality. No small bowel wall thickening or inflammation. No small bowel obstruction.Normal appendix. Vascular/Lymphatic: No aortic aneurysm. Diffuse aortoiliac atherosclerosis. No intraabdominal or pelvic lymphadenopathy. Reproductive: The pelvic organs are obscured by metallic streak artifact from both hip arthroplasties. No free pelvic fluid. Other: No pneumoperitoneum, ascites, or mesenteric inflammation. Musculoskeletal: Destructive soft tissue mass in the right pedicle of L1, measuring 3.5 cm, encroaching into the neural foramen and causing significant spinal canal narrowing (axial 23). A smaller destructive mass in the right pedicle of the L4 vertebral body measures 1.7 cm, encroaching into the right neural foramen and the lateral right spinal canal (axial 39). 2.7 cm lytic lesion in the right L2 vertebral body. Both hip arthroplasties are anatomically aligned without dislocation. Age-indeterminate, fracture of the left superior pubic ramus at the puboacetabular junction. Large destructive mass filling the inferior left pubic ramus measuring 3.5 cm. IMPRESSION: 1. Multiple small hypodensities in the liver measuring up to 1.3 cm in the inferior right hepatic lobe (axial 27), worrisome for metastatic disease. 2. Diffuse bony metastatic disease throughout the abdomen and pelvis. For example, there is a destructive soft tissue mass in the right L1 pedicle, measuring 3.5 cm, causing significant spinal canal  and neuroforaminal narrowing (axial 23). 3. Age-indeterminate, but favored to be subacute, fracture of the left superior pubic ramus at the puboacetabular junction. Large destructive mass filling the inferior left pubic ramus, measuring 3.5 cm. Electronically Signed   By: Rance Burrows M.D.   On: 05/22/2024 19:26   CT CHEST WO CONTRAST Result Date: 05/22/2024 CLINICAL DATA:  Mass of upper lobe of right lung. Abnormal x-ray. History of left-sided breast cancer. Current smoker. EXAM: CT CHEST WITHOUT CONTRAST TECHNIQUE: Multidetector CT  imaging of the chest was performed following the standard protocol without IV contrast. RADIATION DOSE REDUCTION: This exam was performed according to the departmental dose-optimization program which includes automated exposure control, adjustment of the mA and/or kV according to patient size and/or use of iterative reconstruction technique. COMPARISON:  Chest radiograph 02/17/2024 FINDINGS: Cardiovascular: The heart is normal in size. There are coronary artery calcifications. No pericardial effusion. Moderate aortic atherosclerosis. No aortic aneurysm. Mediastinum/Nodes: Pathologic mediastinal adenopathy including a 19 mm short axis right paratracheal node series 2, image 54. There additional prominent mediastinal lymph nodes. Hilar assessment is limited in the absence of IV contrast. Unremarkable appearance of the esophagus. Lungs/Pleura: Right upper lobe mass measures 5.8 x 5.7 cm, series 3, image 76. This causes bowing of the minor fissure. Centrally this lesion abuts the mediastinal fat with loss of fat plane. This is increased in size from prior chest radiograph. 3 mm left upper lobe nodule series 3, image 39. Mild emphysema. Mild subpleural reticulation within the dependent lower lobes. Mild retained secretions in the central airways. No pleural effusion or thickening. Upper Abdomen: Posterior gastric diverticulum. No adrenal nodule. 6.3 cm fluid density lesion arising from  the lateral left kidney is likely cyst, although incompletely included in the field of view. The liver appears mildly heterogeneous but no discrete lesion is seen. Musculoskeletal: There are multiple lytic lesions throughout the ribs and spine consistent with osseous metastatic disease. Lytic lesion within T7 has associated mild pathologic compression fracture as well as loss of posterior cortical margin and possible extraosseous soft tissue component. There are lesions throughout multiple thoracic vertebra, posterior elements and many ribs. Fractures of lateral lower left ribs may be in part pathologic, and are likely subacute with surrounding callus formation. The largest rib lesion involves posterior left ninth rib, expansile with soft tissue component. Bilateral scapular lucent lesions with a fracture involving the inferior right scapular body. Surgical clips in the left breast and axilla. IMPRESSION: 1. A 5.8 cm right upper lobe mass, increased in size from prior chest radiograph, highly suspicious for primary lung malignancy. 2. Pathologic mediastinal adenopathy, typical of metastatic disease. 3. 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 There is a mild pathologic compression fracture of T7. Soft tissue component with loss of posterior cortex and possible extraosseous soft tissue component. Recommend thoracic spine MRI to assess spinal canal patency. Fractures of lateral lower left ribs may be in part pathologic, and are likely subacute with surrounding callus formation. There is a fracture involving the inferior right scapular body. 4. Coronary artery calcifications. Aortic Atherosclerosis (ICD10-I70.0) and Emphysema (ICD10-J43.9). These results will be called to the ordering clinician or representative by the Radiologist Assistant, and communication documented in the PACS or [employer]. Electronically Signed   By: Chadwick Colonel M.D.   On: 05/22/2024 15:10     Procedures Procedures    Medications Ordered in ED Medications  calcitonin (MIACALCIN) injection 280 Units (280 Units Intramuscular Given 05/22/24 2330)  0.9 %  sodium chloride  infusion ( Intravenous New Bag/Given 05/22/24 2145)  amLODipine  (NORVASC ) tablet 5 mg (has no administration in time range)  metoprolol  succinate (TOPROL -XL) 24 hr tablet 150 mg (has no administration in time range)  heparin injection 5,000 Units (has no administration in time range)  sodium chloride  0.9 % bolus 1,000 mL (1,000 mLs Intravenous New Bag/Given 05/22/24 1734)  ondansetron  (ZOFRAN ) injection 4 mg (4 mg Intravenous Given 05/22/24 1730)  HYDROmorphone  (DILAUDID ) injection 1 mg (1 mg Intravenous Given 05/22/24 1731)  sodium chloride  0.9 % bolus 1,000 mL (1,000 mLs Intravenous Bolus 05/22/24 1945)  Zoledronic Acid (ZOMETA) IVPB 4 mg (4 mg Intravenous New Bag/Given 05/22/24 2146)  potassium chloride SA (KLOR-CON M) CR tablet 40 mEq (40 mEq Oral Given 05/22/24 1943)    ED Course/ Medical Decision Making/ A&P Clinical Course as of 05/22/24 2348  Tue May 22, 2024  1648 Labs were sent over from her primary care office which shows that she has an AKI with a BUN of 38 and creatinine of 2.3 with a calcium  of 18. [RP]  2046 Dr Larrie Po from [facility] consulted. Feels that she needs palliative radiation and therapy. Recommends MRI as well. Does not feel that she would benefit from surgery. Feels that she needs palliative therapy.  [RP]  2052 Dr. Lee Public from oncology aware. [RP]  2112 Dr [facility] Lavender from hospitalist to admit the patient.  [RP]    Clinical Course User Index [RP] Ninetta Basket, MD                                 Medical Decision Making Amount and/or Complexity of Data Reviewed Labs: ordered. Radiology: ordered.  Risk Prescription drug management. Decision regarding hospitalization.   Angela Lin is a 63 y.o. female with comorbidities that complicate the patient evaluation including  breast cancer and prior tobacco use who presents to the emergency department with pain and abnormal labs   Initial Ddx:  Metastatic cancer, kidney stone, hypercalcemia, spinal cord compression, electrolyte abnormality, AKI  MDM/Course:  Patient presents emergency department with right-sided flank pain.  Did have a CT scan today that shows a large lung mass with multiple bony metastases and pathologic rib fractures.  Also has a T7 fracture on her imaging today.  Had lab work that showed hypercalcemia and AKI.  Given IV fluids, calcitonin, and bisphosphonates.  On exam does not have any focal neurologic deficits.  Does have some right-sided flank pain.  Given her hypercalcemia could be a met to her kidney stone.  Noncontrast CT of the abdomen pelvis was obtained due to her renal function which showed multiple other metastases with possible spinal canal compromise.  This was discussed with neurosurgery who felt that there were no acute interventions warranted on there and that she should undergo palliative therapy.  Dr. Gudina from oncology updated and will see the patient tomorrow.  Admitted to hospitalist for further evaluation.    This patient presents to the ED for concern of complaints listed in HPI, this involves an extensive number of treatment options, and is a complaint that carries with it a high risk of complications and morbidity. Disposition including potential need for admission considered.   Dispo: Admit to Floor  Records reviewed Outpatient Clinic Notes The following labs were independently interpreted: Chemistry and show AKI and hypercalcemia I independently reviewed the following imaging with scope of interpretation limited to determining acute life threatening conditions related to emergency care: CT Abdomen/Pelvis and agree with the radiologist interpretation with the following exceptions: none I personally reviewed and interpreted cardiac monitoring: normal sinus rhythm  I  personally reviewed and interpreted the pt's EKG: see above for interpretation  I have reviewed the patients home medications and made adjustments as needed Consults: Hospitalist, Neurosurgery, and oncology  Portions of this note were generated with Scientist, clinical (histocompatibility and immunogenetics). Dictation errors may occur despite best attempts at proofreading.    CRITICAL CARE Performed by: Ninetta Basket  Total critical care time: 30 minutes  Critical care time was exclusive of separately billable procedures and treating other patients.  Critical care was necessary to treat or prevent imminent or life-threatening deterioration.  Critical care was time spent personally by me on the following activities: development of treatment plan with patient and/or surrogate as well as nursing, discussions with consultants, evaluation of patient's response to treatment, examination of patient, obtaining history from patient or surrogate, ordering and performing treatments and interventions, ordering and review of laboratory studies, ordering and review of radiographic studies, pulse oximetry and re-evaluation of patient's condition.   Final Clinical Impression(s) / ED Diagnoses Final diagnoses:  Hypercalcemia  Acute kidney injury (HCC)  Lung mass  Multiple pathological fractures, initial encounter  Metastatic malignant neoplasm, unspecified site [facility])    Rx / DC Orders ED Discharge Orders     None         Ninetta Basket, MD 05/22/24 2348

## 2024-05-22 NOTE — ED Notes (Signed)
 ED TO INPATIENT HANDOFF REPORT  ED Nurse Name and Phone #: Henriette Lofty RN  S Name/Age/Gender Angela Lin Sheeler 63 y.o. female Room/Bed: WA04/WA04  Code Status   Code Status: Prior  Home/SNF/Other Home Patient oriented to: self, place, time, and situation Is this baseline? Yes   Triage Complete: Triage complete  Chief Complaint Hypercalcemia [E83.52]  Triage Note Pt came in after her oncologist stated she needed fluids after realizing her calcium  was high.    Allergies Allergies  Allergen Reactions   Hydrochlorothiazide Other (See Comments)    Level of Care/Admitting Diagnosis ED Disposition     ED Disposition  Admit   Condition  --   Comment  Hospital Area: [facility] [[id number]]  Level of Care: Progressive [102]  Admit to Progressive based on following criteria: NEPHROLOGY stable condition requiring close monitoring for AKI, requiring Hemodialysis or Peritoneal Dialysis either from expected electrolyte imbalance, acidosis, or fluid overload that can be managed by NIPPV or high flow oxygen.  May admit patient to [facility] or [facility] if equivalent level of care is available:: No  Covid Evaluation: Asymptomatic - no recent exposure (last 10 days) testing not required  Diagnosis: Hypercalcemia [275.42.ICD-9-CM]  Admitting Physician: [name] [[id number]]  Attending Physician: [name] [phone]  Certification:: I certify this patient will need inpatient services for at least 2 midnights  Expected Medical Readiness: 05/24/2024          B Medical/Surgery History Past Medical History:  Diagnosis Date   Arthritis    osteoarthritis- hip   Endometriosis    Heart murmur    as a child, reports as benign    Hypertension    Past Surgical History:  Procedure Laterality Date   BREAST BIOPSY Left 03/22/2022   lt 12:00 venus clip path pending   BREAST LUMPECTOMY Left 05/26/2022   Procedure: BREAST LUMPECTOMY WITH  EXCISION OF SENTINEL NODE;  Surgeon: Caralyn Chandler, MD;  Location: [facility];  Service: General;  Laterality: Left;  GEN & PEC BLOCK   DIAGNOSTIC LAPAROSCOPY     endometriosis- 20 yrs ago   TOTAL HIP ARTHROPLASTY Right 06/04/2016   Procedure: RIGHT TOTAL HIP ARTHROPLASTY ANTERIOR APPROACH;  Surgeon: Arnie Lao, MD;  Location: [facility];  Service: Orthopedics;  Laterality: Right;   TOTAL HIP ARTHROPLASTY Left 12/08/2018   Procedure: LEFT TOTAL HIP ARTHROPLASTY ANTERIOR APPROACH;  Surgeon: Arnie Lao, MD;  Location: [facility];  Service: Orthopedics;  Laterality: Left;     A IV Location/Drains/Wounds Patient Lines/Drains/Airways Status     Active Line/Drains/Airways     Name Placement date Placement time Site Days   Peripheral IV 05/22/24 20 G Left Antecubital 05/22/24  1728  Antecubital  less than 1            Intake/Output Last 24 hours No intake or output data in the 24 hours ending 05/22/24 2202  Labs/Imaging Results for orders placed or performed during the hospital encounter of 05/22/24 (from the past 48 hours)  CBC with Differential     Status: Abnormal   Collection Time: 05/22/24  5:36 PM  Result Value Ref Range   WBC 9.0 4.0 - 10.5 K/uL   RBC 4.40 3.87 - 5.11 MIL/uL   Hemoglobin 12.8 12.0 - 15.0 g/dL   HCT 34.7 42.5 - 95.6 %   MCV 85.2 80.0 - 100.0 fL   MCH 29.1 26.0 - 34.0 pg   MCHC 34.1 30.0 - 36.0 g/dL  RDW 11.9 11.5 - 15.5 %   Platelets 351 150 - 400 K/uL   nRBC 0.0 0.0 - 0.2 %   Neutrophils Relative % 74 %   Neutro Abs 6.6 1.7 - 7.7 K/uL   Lymphocytes Relative 10 %   Lymphs Abs 0.9 0.7 - 4.0 K/uL   Monocytes Relative 12 %   Monocytes Absolute 1.1 (H) 0.1 - 1.0 K/uL   Eosinophils Relative 3 %   Eosinophils Absolute 0.3 0.0 - 0.5 K/uL   Basophils Relative 0 %   Basophils Absolute 0.0 0.0 - 0.1 K/uL   Immature Granulocytes 1 %   Abs Immature Granulocytes 0.10 (H) 0.00 - 0.07 K/uL    Comment: Performed at [facility], [address], [place], [place] [address]  Comprehensive metabolic panel     Status: Abnormal   Collection Time: 05/22/24  5:36 PM  Result Value Ref Range   Sodium 130 (L) 135 - 145 mmol/L   Potassium 3.1 (L) 3.5 - 5.1 mmol/L   Chloride 90 (L) 98 - 111 mmol/L   CO2 29 22 - 32 mmol/L   Glucose, Bld 118 (H) 70 - 99 mg/dL    Comment: Glucose reference range applies only to samples taken after fasting for at least 8 hours.   BUN 40 (H) 8 - 23 mg/dL   Creatinine, Ser 1.91 (H) 0.44 - 1.00 mg/dL   Calcium  >15.0 (HH) 8.9 - 10.3 mg/dL    Comment: REPEATED TO VERIFY CRITICAL RESULT CALLED TO, READ BACK BY AND VERIFIED WITH J.NELSON, RN AT 1842 ON 05/22/24 BY N.THOMPSON    Total Protein 7.5 6.5 - 8.1 g/dL   Albumin 3.3 (L) 3.5 - 5.0 g/dL   AST 21 15 - 41 U/L   ALT 27 0 - 44 U/L   Alkaline Phosphatase 151 (H) 38 - 126 U/L   Total Bilirubin 0.8 0.0 - 1.2 mg/dL   GFR, Estimated 19 (L) >60 mL/min    Comment: (NOTE) Calculated using the CKD-EPI Creatinine Equation (2021)    Anion gap 11 5 - 15    Comment: Performed at [facility], [address], [place], [place] [address]   CT Renal Stone Study Result Date: 05/22/2024 CLINICAL DATA:  R flank pain, recent cancer diagnosis, aki EXAM: CT ABDOMEN AND PELVIS WITHOUT CONTRAST TECHNIQUE: Multidetector CT imaging of the abdomen and pelvis was performed following the standard protocol without IV contrast. RADIATION DOSE REDUCTION: This exam was performed according to the departmental dose-optimization program which includes automated exposure control, adjustment of the mA and/or kV according to patient size and/or use of iterative reconstruction technique. COMPARISON:  None Available. FINDINGS: Of note, the lack of intravenous contrast limits evaluation of the solid organ parenchyma and vascularity. Lower chest: For findings above the diaphragm, please see the separately dictated CT of the chest report, which was  performed concurrently. Hepatobiliary: Subcentimeter hypodensity in the right hepatic lobe measuring 5 mm (axial 9). Left hepatic lobe hypodensity measuring 9 mm (axial 9). Segment 4 hypodensity measuring 1.1 cm (axial 17). 1.3 cm hypodensity abutting the gallbladder fossa in the right hepatic lobe inferiorly (axial 27).Focal fatty infiltration along the falciform ligament.Layering biliary sludge or punctate gallstones. No gallbladder wall thickening. No intrahepatic or extrahepatic biliary ductal dilation. Pancreas: No mass or main ductal dilation. No peripancreatic inflammation or fluid collection. Spleen: Normal size. No mass. Adrenals/Urinary Tract: No adrenal mass. Large cyst in the left upper pole. No hydronephrosis or nephrolithiasis. The urinary bladder is  obscured by metallic streak artifact by both hip arthroplasties. Stomach/Bowel:Likely fluid-filled diverticulum arising from the posterior stomach (axial 11). The stomach contains ingested material without focal abnormality. No small bowel wall thickening or inflammation. No small bowel obstruction.Normal appendix. Vascular/Lymphatic: No aortic aneurysm. Diffuse aortoiliac atherosclerosis. No intraabdominal or pelvic lymphadenopathy. Reproductive: The pelvic organs are obscured by metallic streak artifact from both hip arthroplasties. No free pelvic fluid. Other: No pneumoperitoneum, ascites, or mesenteric inflammation. Musculoskeletal: Destructive soft tissue mass in the right pedicle of L1, measuring 3.5 cm, encroaching into the neural foramen and causing significant spinal canal narrowing (axial 23). A smaller destructive mass in the right pedicle of the L4 vertebral body measures 1.7 cm, encroaching into the right neural foramen and the lateral right spinal canal (axial 39). 2.7 cm lytic lesion in the right L2 vertebral body. Both hip arthroplasties are anatomically aligned without dislocation. Age-indeterminate, fracture of the left superior pubic  ramus at the puboacetabular junction. Large destructive mass filling the inferior left pubic ramus measuring 3.5 cm. IMPRESSION: 1. Multiple small hypodensities in the liver measuring up to 1.3 cm in the inferior right hepatic lobe (axial 27), worrisome for metastatic disease. 2. Diffuse bony metastatic disease throughout the abdomen and pelvis. For example, there is a destructive soft tissue mass in the right L1 pedicle, measuring 3.5 cm, causing significant spinal canal and neuroforaminal narrowing (axial 23). 3. Age-indeterminate, but favored to be subacute, fracture of the left superior pubic ramus at the puboacetabular junction. Large destructive mass filling the inferior left pubic ramus, measuring 3.5 cm. Electronically Signed   By: Rance Burrows M.D.   On: 05/22/2024 19:26   CT CHEST WO CONTRAST Result Date: 05/22/2024 CLINICAL DATA:  Mass of upper lobe of right lung. Abnormal x-ray. History of left-sided breast cancer. Current smoker. EXAM: CT CHEST WITHOUT CONTRAST TECHNIQUE: Multidetector CT imaging of the chest was performed following the standard protocol without IV contrast. RADIATION DOSE REDUCTION: This exam was performed according to the departmental dose-optimization program which includes automated exposure control, adjustment of the mA and/or kV according to patient size and/or use of iterative reconstruction technique. COMPARISON:  Chest radiograph 02/17/2024 FINDINGS: Cardiovascular: The heart is normal in size. There are coronary artery calcifications. No pericardial effusion. Moderate aortic atherosclerosis. No aortic aneurysm. Mediastinum/Nodes: Pathologic mediastinal adenopathy including a 19 mm short axis right paratracheal node series 2, image 54. There additional prominent mediastinal lymph nodes. Hilar assessment is limited in the absence of IV contrast. Unremarkable appearance of the esophagus. Lungs/Pleura: Right upper lobe mass measures 5.8 x 5.7 cm, series 3, image 76. This  causes bowing of the minor fissure. Centrally this lesion abuts the mediastinal fat with loss of fat plane. This is increased in size from prior chest radiograph. 3 mm left upper lobe nodule series 3, image 39. Mild emphysema. Mild subpleural reticulation within the dependent lower lobes. Mild retained secretions in the central airways. No pleural effusion or thickening. Upper Abdomen: Posterior gastric diverticulum. No adrenal nodule. 6.3 cm fluid density lesion arising from the lateral left kidney is likely cyst, although incompletely included in the field of view. The liver appears mildly heterogeneous but no discrete lesion is seen. Musculoskeletal: There are multiple lytic lesions throughout the ribs and spine consistent with osseous metastatic disease. Lytic lesion within T7 has associated mild pathologic compression fracture as well as loss of posterior cortical margin and possible extraosseous soft tissue component. There are lesions throughout multiple thoracic vertebra, posterior elements and many ribs. Fractures of lateral  lower left ribs may be in part pathologic, and are likely subacute with surrounding callus formation. The largest rib lesion involves posterior left ninth rib, expansile with soft tissue component. Bilateral scapular lucent lesions with a fracture involving the inferior right scapular body. Surgical clips in the left breast and axilla. IMPRESSION: 1. A 5.8 cm right upper lobe mass, increased in size from prior chest radiograph, highly suspicious for primary lung malignancy. 2. Pathologic mediastinal adenopathy, typical of metastatic disease. 3. 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 There is a mild pathologic compression fracture of T7. Soft tissue component with loss of posterior cortex and possible extraosseous soft tissue component. Recommend thoracic spine MRI to assess spinal canal patency. Fractures of lateral lower left ribs may be in part  pathologic, and are likely subacute with surrounding callus formation. There is a fracture involving the inferior right scapular body. 4. Coronary artery calcifications. Aortic Atherosclerosis (ICD10-I70.0) and Emphysema (ICD10-J43.9). These results will be called to the ordering clinician or representative by the Radiologist Assistant, and communication documented in the PACS or [employer]. Electronically Signed   By: Chadwick Colonel M.D.   On: 05/22/2024 15:10    Pending Labs Unresulted Labs (From admission, onward)     Start     Ordered   05/22/24 1644  Calcium , ionized  Once,   STAT        05/22/24 1643   05/22/24 1644  Parathyroid hormone, intact (no Ca)  Once,   URGENT        05/22/24 1643   05/22/24 1644  PTH-related peptide  Once,   URGENT        05/22/24 1643            Vitals/Pain Today's Vitals   05/22/24 1634 05/22/24 1731 05/22/24 1842 05/22/24 1946  BP: (!) 113/97   126/62  Pulse: 69   69  Resp: 18   18  Temp: 97.8 F (36.6 C)   97.9 F (36.6 C)  TempSrc: Oral   Oral  SpO2: 100%   96%  PainSc:  10-Worst pain ever 0-No pain 0-No pain    Isolation Precautions No active isolations  Medications Medications  calcitonin (MIACALCIN) injection 280 Units (has no administration in time range)  0.9 %  sodium chloride  infusion ( Intravenous New Bag/Given 05/22/24 2145)  sodium chloride  0.9 % bolus 1,000 mL (1,000 mLs Intravenous New Bag/Given 05/22/24 1734)  ondansetron  (ZOFRAN ) injection 4 mg (4 mg Intravenous Given 05/22/24 1730)  HYDROmorphone  (DILAUDID ) injection 1 mg (1 mg Intravenous Given 05/22/24 1731)  sodium chloride  0.9 % bolus 1,000 mL (1,000 mLs Intravenous Bolus 05/22/24 1945)  Zoledronic Acid (ZOMETA) IVPB 4 mg (4 mg Intravenous New Bag/Given 05/22/24 2146)  potassium chloride SA (KLOR-CON M) CR tablet 40 mEq (40 mEq Oral Given 05/22/24 1943)    Mobility walks     Focused Assessments Cardiac Assessment Handoff:    No results found for:  "CKTOTAL", "CKMB", "CKMBINDEX", "TROPONINI" No results found for: "DDIMER" Does the Patient currently have chest pain?    R Recommendations: See Admitting Provider Note  Report given to:   Additional Notes: hello this pt is getting admitted because of abnormal labs, pt alert x4, ambulatory, 2lnc sating 96%, no complaints as of now

## 2024-05-23 ENCOUNTER — Inpatient Hospital Stay (HOSPITAL_COMMUNITY)

## 2024-05-23 ENCOUNTER — Other Ambulatory Visit

## 2024-05-23 DIAGNOSIS — E876 Hypokalemia: Secondary | ICD-10-CM

## 2024-05-23 DIAGNOSIS — R918 Other nonspecific abnormal finding of lung field: Secondary | ICD-10-CM

## 2024-05-23 DIAGNOSIS — M84454A Pathological fracture, pelvis, initial encounter for fracture: Secondary | ICD-10-CM

## 2024-05-23 DIAGNOSIS — I10 Essential (primary) hypertension: Secondary | ICD-10-CM | POA: Diagnosis not present

## 2024-05-23 DIAGNOSIS — R0602 Shortness of breath: Secondary | ICD-10-CM

## 2024-05-23 DIAGNOSIS — N179 Acute kidney failure, unspecified: Secondary | ICD-10-CM | POA: Diagnosis not present

## 2024-05-23 DIAGNOSIS — F17210 Nicotine dependence, cigarettes, uncomplicated: Secondary | ICD-10-CM

## 2024-05-23 LAB — ECHOCARDIOGRAM COMPLETE
Area-P 1/2: 3.61 cm2
Calc EF: 64.2 %
Height: 65 in
S' Lateral: 2.7 cm
Single Plane A2C EF: 64.8 %
Single Plane A4C EF: 63.4 %
Weight: 2398.6 [oz_av]

## 2024-05-23 LAB — CBC
HCT: 38.2 % (ref 36.0–46.0)
Hemoglobin: 12.6 g/dL (ref 12.0–15.0)
MCH: 29.4 pg (ref 26.0–34.0)
MCHC: 33 g/dL (ref 30.0–36.0)
MCV: 89 fL (ref 80.0–100.0)
Platelets: 311 10*3/uL (ref 150–400)
RBC: 4.29 MIL/uL (ref 3.87–5.11)
RDW: 12.1 % (ref 11.5–15.5)
WBC: 6.6 10*3/uL (ref 4.0–10.5)
nRBC: 0 % (ref 0.0–0.2)

## 2024-05-23 LAB — BASIC METABOLIC PANEL WITH GFR
Anion gap: 8 (ref 5–15)
Anion gap: 8 (ref 5–15)
BUN: 30 mg/dL — ABNORMAL HIGH (ref 8–23)
BUN: 26 mg/dL — ABNORMAL HIGH (ref 8–23)
CO2: 24 mmol/L (ref 22–32)
CO2: 24 mmol/L (ref 22–32)
Calcium: 14.1 mg/dL (ref 8.9–10.3)
Calcium: 13.1 mg/dL (ref 8.9–10.3)
Chloride: 103 mmol/L (ref 98–111)
Chloride: 102 mmol/L (ref 98–111)
Creatinine, Ser: 2.09 mg/dL — ABNORMAL HIGH (ref 0.44–1.00)
Creatinine, Ser: 2.14 mg/dL — ABNORMAL HIGH (ref 0.44–1.00)
GFR, Estimated: 26 mL/min — ABNORMAL LOW (ref >60)
GFR, Estimated: 26 mL/min — ABNORMAL LOW (ref >60)
Glucose, Bld: 96 mg/dL (ref 70–99)
Glucose, Bld: 96 mg/dL (ref 70–99)
Potassium: 3.7 mmol/L (ref 3.5–5.1)
Potassium: 3.2 mmol/L — ABNORMAL LOW (ref 3.5–5.1)
Sodium: 135 mmol/L (ref 135–145)
Sodium: 134 mmol/L — ABNORMAL LOW (ref 135–145)

## 2024-05-23 LAB — CALCIUM, IONIZED: Calcium, Ionized, Serum: 9.4 mg/dL — ABNORMAL HIGH (ref 4.5–5.6)

## 2024-05-23 LAB — COMPREHENSIVE METABOLIC PANEL WITH GFR
ALT: 24 U/L (ref 0–44)
AST: 18 U/L (ref 15–41)
Albumin: 3 g/dL — ABNORMAL LOW (ref 3.5–5.0)
Alkaline Phosphatase: 140 U/L — ABNORMAL HIGH (ref 38–126)
Anion gap: 8 (ref 5–15)
BUN: 36 mg/dL — ABNORMAL HIGH (ref 8–23)
CO2: 26 mmol/L (ref 22–32)
Calcium: >15 mg/dL (ref 8.9–10.3)
Chloride: 101 mmol/L (ref 98–111)
Creatinine, Ser: 2.26 mg/dL — ABNORMAL HIGH (ref 0.44–1.00)
GFR, Estimated: 24 mL/min — ABNORMAL LOW (ref >60)
Glucose, Bld: 103 mg/dL — ABNORMAL HIGH (ref 70–99)
Potassium: 3.8 mmol/L (ref 3.5–5.1)
Sodium: 135 mmol/L (ref 135–145)
Total Bilirubin: 0.4 mg/dL (ref 0.0–1.2)
Total Protein: 6.9 g/dL (ref 6.5–8.1)

## 2024-05-23 LAB — PARATHYROID HORMONE, INTACT (NO CA): PTH: 4 pg/mL — ABNORMAL LOW (ref 15–65)

## 2024-05-23 LAB — HIV ANTIBODY (ROUTINE TESTING W REFLEX): HIV Screen 4th Generation wRfx: NONREACTIVE

## 2024-05-23 MED ORDER — SODIUM CHLORIDE 0.9 % IV SOLN: INTRAVENOUS | Status: DC

## 2024-05-23 MED ORDER — HYDROMORPHONE HCL 1 MG/ML IJ SOLN: 1.0000 mg | INTRAMUSCULAR | Status: DC | PRN

## 2024-05-23 MED ORDER — ENSURE PLUS HIGH PROTEIN PO LIQD
237.0000 mL | Freq: Two times a day (BID) | ORAL | Status: DC
  Administered 2024-05-25 – 2024-05-29 (×5): 237 mL via ORAL

## 2024-05-23 MED ORDER — HYDROMORPHONE HCL 1 MG/ML IJ SOLN
0.5000 mg | INTRAMUSCULAR | Status: DC | PRN
  Filled 2024-05-23 (×2): qty 0.5

## 2024-05-23 MED ORDER — POTASSIUM CHLORIDE CRYS ER 20 MEQ PO TBCR: 40.0000 meq | EXTENDED_RELEASE_TABLET | Freq: Once | ORAL | Status: DC

## 2024-05-23 MED ORDER — OXYCODONE HCL 5 MG PO TABS
5.0000 mg | ORAL_TABLET | ORAL | Status: DC | PRN
  Administered 2024-05-23 – 2024-06-09 (×40): 5 mg via ORAL
  Filled 2024-05-23 (×43): qty 1

## 2024-05-23 MED ORDER — FENTANYL CITRATE PF 50 MCG/ML IJ SOSY
25.0000 ug | PREFILLED_SYRINGE | INTRAMUSCULAR | Status: DC | PRN
  Administered 2024-05-23 (×2): 25 ug via INTRAVENOUS
  Filled 2024-05-23 (×2): qty 1

## 2024-05-23 MED ORDER — OXYCODONE HCL 5 MG PO TABS: 5.0000 mg | ORAL_TABLET | ORAL | Status: DC | PRN

## 2024-05-23 MED ORDER — LETROZOLE 2.5 MG PO TABS
2.5000 mg | ORAL_TABLET | Freq: Every day | ORAL | Status: DC
  Administered 2024-05-23 – 2024-06-09 (×17): 2.5 mg via ORAL
  Filled 2024-05-23 (×18): qty 1

## 2024-05-23 MED ORDER — ATORVASTATIN CALCIUM 10 MG PO TABS
10.0000 mg | ORAL_TABLET | Freq: Every day | ORAL | Status: DC
  Administered 2024-05-24 – 2024-05-31 (×8): 10 mg via ORAL
  Filled 2024-05-23 (×9): qty 1

## 2024-05-23 MED ORDER — HYDRALAZINE HCL 20 MG/ML IJ SOLN: 10.0000 mg | Freq: Four times a day (QID) | INTRAMUSCULAR | Status: DC | PRN

## 2024-05-23 MED ORDER — POTASSIUM CHLORIDE CRYS ER 20 MEQ PO TBCR
40.0000 meq | EXTENDED_RELEASE_TABLET | Freq: Once | ORAL | Status: AC
  Administered 2024-05-23: 40 meq via ORAL
  Filled 2024-05-23: qty 2

## 2024-05-23 MED ORDER — FENTANYL CITRATE PF 50 MCG/ML IJ SOSY
25.0000 ug | PREFILLED_SYRINGE | INTRAMUSCULAR | Status: DC | PRN
  Administered 2024-05-23 (×4): 25 ug via INTRAVENOUS
  Filled 2024-05-23 (×4): qty 1

## 2024-05-23 NOTE — Hospital Course (Addendum)
 63 y.o. 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  Patient follows with Dr. Gudena. Patient had outpatient labs performed revealing severe hypercalcemia patient was instructed to come to the [facility] Emergency Department for evaluation. in ED:CT imaging of the chest abdomen and pelvis was performed revealing a right upper lobe mass measuring 5.8 x 5.7 cm in addition to diffuse bony metastatic disease throughout the abdomen and pelvis with a destructive soft tissue mass in the right L1 pedicle causing significant spinal canal and neuroforaminal narrowing.  Case was discussed with Dr. Larrie Po of neurosurgery who recommended oncology and radiation oncology consultations for potential management.  Patient was initiated on intravenous fluids for the severe hypercalcemia and the hospitalist group was then called to assess the patient for admission to the hospital. Patient treated with IVF, bisphosphonate, calcitonin.   She's now s/p bronchoscopy.  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   She's not interested in treatment per discussion with her oncology (Dr. Lee Public) 6/2. At this time patient is waiting for skilled nursing facility>. 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 S/p calcitonin and zometa  - ca better. Cytology from 5/29  bronch showing malignant cells most c/w metastatic breast carcinoma and malignant cells consistent with non small cell carcinoma Oncology discussed with the patient and she does not want to pursue any further cancer treatment > wants to consider rehabilitation. Radiation onc planning for radiation to T7, L1, and right rib metastases (first treatment 6/2) PTOT consult done pending SNF bed offers. P2P review attempted 6/6 and 6/7 and [facility] has been closed for the weekend> called [employer] and they have been given timeline tomorrow and they will contact us . Phone no  provided  Hx Breast Cancer with Lung and Bone Mets Follows with Dr. Lee Public. Workup for malignancy as noted above   AKI Hypokalemia Resolved   Hypomagnesemia : Continue Mag-Ox.     Pathologic Pubic Rami Fractures: WBAT, cont Non op management per orth (5/28 note from Dorthy Gavia)   Hypertension: BP stable, continue Amlodipine , metoprolol     Leukocytosis Related to steroids

## 2024-05-23 NOTE — Consult Note (Signed)
 NAME:  Angela Lin, MRN:  [id number], DOB:  March 27, 1961, LOS: 1 ADMISSION DATE:  05/22/2024, CONSULTATION DATE:  05/23/24 REFERRING MD:  TRiad MD Dr Alfred Imperial, CHIEF COMPLAINT:  Lung mass, bony meets. Mediastninal adenopathy   History of Present Illness:   63 year old female lives alone. Only blood family is 24 year old mom and brother in town. Has history of breast cancer followed by Dr Lee Public. Has ongoing > 20 pack smoking history.  She is on letrozole  since 2023 and status post lumpectomy and radiation for breast cancer.  She came into the ER with new onset of low back pain for 4 weeks without any urine diarrhea or stool incontinence.  She also had sciatica to the right lower extremity.  Denied any vomiting fever or chills.  Found to have hypercalcemia with a calcium  greater than 15.  Also acute kidney injury with a creatinine of 2.7 mg percent.  Also had worsening metastatic lesion to the spine T7 compression fracture and also spinal canal and neural narrowing in the lumbar area.  There was also a subacute fracture of the left superior rami and destructive lesion of the left inferior rami with metastasis.  She has been started on fluids, zoledronate and calcitonin.  She is pending an MRI.  CT scan of the chest showed Right upper lobe mass measures 5.8 x 5.7 cm, series 3, image 76. This causes bowing of the minor fissure. Centrally this lesion abuts the mediastinal fat with loss of fat plane.  In addition the mediastinal adenopathy [all present visualized].  Pulmonary has been consulted.  Upon walking to the room mother and patient extremely anxious about social situation upon discharge.  Fears of being able to manage illness alone.  They also declined MRI.  Educated about need for MRI and they were agreeable.   Past Medical History:    has a past medical history of Arthritis, Endometriosis, Heart murmur, and Hypertension.   reports that she has been smoking cigarettes. She has a 20  pack-year smoking history. She has never used smokeless tobacco.  Past Surgical History:  Procedure Laterality Date   BREAST BIOPSY Left 03/22/2022   lt 12:00 venus clip path pending   BREAST LUMPECTOMY Left 05/26/2022   Procedure: BREAST LUMPECTOMY WITH EXCISION OF SENTINEL NODE;  Surgeon: Caralyn Chandler, MD;  Location: [facility];  Service: General;  Laterality: Left;  GEN & PEC BLOCK   DIAGNOSTIC LAPAROSCOPY     endometriosis- 20 yrs ago   TOTAL HIP ARTHROPLASTY Right 06/04/2016   Procedure: RIGHT TOTAL HIP ARTHROPLASTY ANTERIOR APPROACH;  Surgeon: Arnie Lao, MD;  Location: [facility];  Service: Orthopedics;  Laterality: Right;   TOTAL HIP ARTHROPLASTY Left 12/08/2018   Procedure: LEFT TOTAL HIP ARTHROPLASTY ANTERIOR APPROACH;  Surgeon: Arnie Lao, MD;  Location: [facility];  Service: Orthopedics;  Laterality: Left;    Allergies  Allergen Reactions   Hydrochlorothiazide Other (See Comments)    Unknown     Immunization History  Administered Date(s) Administered   Influenza, Seasonal, Injecte, Preservative Fre 09/22/2023   Influenza,inj,Quad PF,6+ Mos 10/05/2018   Pfizer(Comirnaty)Fall Seasonal Vaccine 12 years and older 02/05/2023, 09/22/2023   Pneumococcal Polysaccharide-23 12/27/2013   Tdap 11/04/2016    Family History  Problem Relation Age of Onset   Cancer Father        Lung cancer- passed 2005    Hypertension Brother      Current Facility-Administered Medications:    0.9 %  sodium  chloride infusion, , Intravenous, Continuous, [name], MD, Last Rate: 125 mL/hr at 05/23/24 0531, New Bag at 05/23/24 0531   amLODipine  (NORVASC ) tablet 5 mg, 5 mg, Oral, Daily, Kakrakandy, Arshad N, MD   calcitonin (MIACALCIN) injection 280 Units, 4 Units/kg (Order-Specific), Intramuscular, BID, [name], MD, 280 Units at 05/22/24 2330   fentaNYL  (SUBLIMAZE ) injection 25 mcg, 25 mcg, Intravenous, Q4H PRN, [name], MD, 25  mcg at 05/23/24 0747   heparin injection 5,000 Units, 5,000 Units, Subcutaneous, Q8H, Kakrakandy, Arshad N, MD, 5,000 Units at 05/23/24 0532   letrozole  (FEMARA ) tablet 2.5 mg, 2.5 mg, Oral, Daily, [name], MD   metoprolol  succinate (TOPROL -XL) 24 hr tablet 150 mg, 150 mg, Oral, Daily, [name], MD     Significant Hospital Events:  05/22/2024 - admit  Interim History / Subjective:   05/23/2024 - seen in [facility] room 1443  Objective    Blood pressure 139/76, pulse 84, temperature 97.7 F (36.5 C), temperature source Oral, resp. rate 19, height 5\' 5"  (1.651 m), weight 68 kg, last menstrual period 08/27/2008, SpO2 96%.        Intake/Output Summary (Last 24 hours) at 05/23/2024 1013 Last data filed at 05/23/2024 0534 Gross per 24 hour  Intake 1268.03 ml  Output --  Net 1268.03 ml   Filed Weights   05/22/24 2300  Weight: 68 kg    Examination: General: Looks anxious.  No distress. HENT: Supple neck no lymphadenopathy no elevated JVP Lungs: Clear to auscultation bilaterally no wheeze no crackles no distress Cardiovascular: Normal heart sounds Abdomen: Soft nontender no organomegaly Extremities: Intact and moves it Neuro: Alert and oriented x 3.  Speech is normal. GU: Not examined  Resolved Hospital Problem list   x  Assessment & Plan:   PULMONARY  A:  Strong smoking history History of breast cancer status postlumpectomy and radiation on letrozole  since April 2023 Presents with - Right upper lobe mass measures 5.8 x 5.7 cm and mediastinal adenopathy, bony metastasis to the hip and spine along with hypercalcemia   05/23/2024 -> highly anxious about potential diagnosis and various options.  P:   Recommended EBUS and navigational bronchoscopy   - Lesion is less ideal for CT-guided transthoracic needle biopsy given significant amount of lung tissue for the radiologist to navigate  - Discussed images with 2 of her interventional pulmonologist  Dr. Baldwin Levee and DR Assaker -both concurred navigational bronchoscopy best option  Risks of pneumothorax, hemothorax, sedation/anesthesia complications such as cardiac or respiratory arrest or hypotension, stroke and bleeding all explained. Benefits of diagnosis but limitations of non-diagnosis also explained. Patient verbalized understanding and wished to proceed.    Case request made and discussed with endoscopy: They will try to get her in at [facility] on 05/24/2024  Subcutaneous heparin stopped  N.p.o. after 1 AM 05/24/2024  IV fluid hydration to continue  Strongly recommended that she should get her MRI   We will check coags and other labs 05/24/2024  Get echocardiogram to ensure no pericardial effusion in the setting of potential advanced lung cancer malignancy with mets    Hypercalcemia  Plan  - Control according to the hospitalist   Best practice (daily eval):  Diet: NPO after 1am 05/24/24 Pain/Anxiety/Delirium protocol (if indicated): per triad (She is very anxious) VAP protocol (if indicated): x DVT prophylaxis: Stop heparin 5/28 in anticipatin of procedure GI prophylaxis: per triad Glucose control:  per triad Mobility: per triad Code Status: full Family Communication:  mother and patient Disposition: med surg  per triad   SIGNATURE    Dr. Maire Scot, M.D., F.C.C.P,  Pulmonary and Critical Care Medicine Staff Physician, [facility] Center Director - Interstitial Lung Disease  Program  Pulmonary Fibrosis [facility] at [facility] [place], [place], [address]  NPI Number:  NPI #[id number]  Pager: [phone], If no answer  -> Check AMION or Try [phone] Telephone (clinical office): [phone] Telephone (research): [phone]  10:13 AM 05/23/2024   05/23/2024 10:13 AM    LABS    PULMONARY No results for input(s): "PHART", "PCO2ART", "PO2ART", "HCO3", "TCO2", "O2SAT" in the last 168  hours.  Invalid input(s): "PCO2", "PO2"  CBC Recent Labs  Lab 05/22/24 1736 05/23/24 0026  HGB 12.8 12.6  HCT 37.5 38.2  WBC 9.0 6.6  PLT 351 311    COAGULATION No results for input(s): "INR" in the last 168 hours.  CARDIAC  No results for input(s): "TROPONINI" in the last 168 hours. No results for input(s): "PROBNP" in the last 168 hours.  CHEMISTRY Recent Labs  Lab 05/22/24 1736 05/23/24 0026  NA 130* 135  K 3.1* 3.8  CL 90* 101  CO2 29 26  GLUCOSE 118* 103*  BUN 40* 36*  CREATININE 2.74* 2.26*  CALCIUM  >15.0* >15.0*   Estimated Creatinine Clearance: 23.2 mL/min (A) (by C-G formula based on SCr of 2.26 mg/dL (H)).   LIVER Recent Labs  Lab 05/22/24 1736 05/23/24 0026  AST 21 18  ALT 27 24  ALKPHOS 151* 140*  BILITOT 0.8 0.4  PROT 7.5 6.9  ALBUMIN 3.3* 3.0*     INFECTIOUS No results for input(s): "LATICACIDVEN", "PROCALCITON" in the last 168 hours.   ENDOCRINE CBG (last 3)  No results for input(s): "GLUCAP" in the last 72 hours.       IMAGING x48h  - image(s) personally visualized  -   highlighted in bold CT Renal Stone Study Result Date: 05/22/2024 CLINICAL DATA:  R flank pain, recent cancer diagnosis, aki EXAM: CT ABDOMEN AND PELVIS WITHOUT CONTRAST TECHNIQUE: Multidetector CT imaging of the abdomen and pelvis was performed following the standard protocol without IV contrast. RADIATION DOSE REDUCTION: This exam was performed according to the departmental dose-optimization program which includes automated exposure control, adjustment of the mA and/or kV according to patient size and/or use of iterative reconstruction technique. COMPARISON:  None Available. FINDINGS: Of note, the lack of intravenous contrast limits evaluation of the solid organ parenchyma and vascularity. Lower chest: For findings above the diaphragm, please see the separately dictated CT of the chest report, which was performed concurrently. Hepatobiliary: Subcentimeter hypodensity  in the right hepatic lobe measuring 5 mm (axial 9). Left hepatic lobe hypodensity measuring 9 mm (axial 9). Segment 4 hypodensity measuring 1.1 cm (axial 17). 1.3 cm hypodensity abutting the gallbladder fossa in the right hepatic lobe inferiorly (axial 27).Focal fatty infiltration along the falciform ligament.Layering biliary sludge or punctate gallstones. No gallbladder wall thickening. No intrahepatic or extrahepatic biliary ductal dilation. Pancreas: No mass or main ductal dilation. No peripancreatic inflammation or fluid collection. Spleen: Normal size. No mass. Adrenals/Urinary Tract: No adrenal mass. Large cyst in the left upper pole. No hydronephrosis or nephrolithiasis. The urinary bladder is obscured by metallic streak artifact by both hip arthroplasties. Stomach/Bowel:Likely fluid-filled diverticulum arising from the posterior stomach (axial 11). The stomach contains ingested material without focal abnormality. No small bowel wall thickening or inflammation. No small bowel obstruction.Normal appendix. Vascular/Lymphatic:  No aortic aneurysm. Diffuse aortoiliac atherosclerosis. No intraabdominal or pelvic lymphadenopathy. Reproductive: The pelvic organs are obscured by metallic streak artifact from both hip arthroplasties. No free pelvic fluid. Other: No pneumoperitoneum, ascites, or mesenteric inflammation. Musculoskeletal: Destructive soft tissue mass in the right pedicle of L1, measuring 3.5 cm, encroaching into the neural foramen and causing significant spinal canal narrowing (axial 23). A smaller destructive mass in the right pedicle of the L4 vertebral body measures 1.7 cm, encroaching into the right neural foramen and the lateral right spinal canal (axial 39). 2.7 cm lytic lesion in the right L2 vertebral body. Both hip arthroplasties are anatomically aligned without dislocation. Age-indeterminate, fracture of the left superior pubic ramus at the puboacetabular junction. Large destructive mass filling  the inferior left pubic ramus measuring 3.5 cm. IMPRESSION: 1. Multiple small hypodensities in the liver measuring up to 1.3 cm in the inferior right hepatic lobe (axial 27), worrisome for metastatic disease. 2. Diffuse bony metastatic disease throughout the abdomen and pelvis. For example, there is a destructive soft tissue mass in the right L1 pedicle, measuring 3.5 cm, causing significant spinal canal and neuroforaminal narrowing (axial 23). 3. Age-indeterminate, but favored to be subacute, fracture of the left superior pubic ramus at the puboacetabular junction. Large destructive mass filling the inferior left pubic ramus, measuring 3.5 cm. Electronically Signed   By: Rance Burrows M.D.   On: 05/22/2024 19:26   CT CHEST WO CONTRAST Result Date: 05/22/2024 CLINICAL DATA:  Mass of upper lobe of right lung. Abnormal x-ray. History of left-sided breast cancer. Current smoker. EXAM: CT CHEST WITHOUT CONTRAST TECHNIQUE: Multidetector CT imaging of the chest was performed following the standard protocol without IV contrast. RADIATION DOSE REDUCTION: This exam was performed according to the departmental dose-optimization program which includes automated exposure control, adjustment of the mA and/or kV according to patient size and/or use of iterative reconstruction technique. COMPARISON:  Chest radiograph 02/17/2024 FINDINGS: Cardiovascular: The heart is normal in size. There are coronary artery calcifications. No pericardial effusion. Moderate aortic atherosclerosis. No aortic aneurysm. Mediastinum/Nodes: Pathologic mediastinal adenopathy including a 19 mm short axis right paratracheal node series 2, image 54. There additional prominent mediastinal lymph nodes. Hilar assessment is limited in the absence of IV contrast. Unremarkable appearance of the esophagus. Lungs/Pleura: Right upper lobe mass measures 5.8 x 5.7 cm, series 3, image 76. This causes bowing of the minor fissure. Centrally this lesion abuts the  mediastinal fat with loss of fat plane. This is increased in size from prior chest radiograph. 3 mm left upper lobe nodule series 3, image 39. Mild emphysema. Mild subpleural reticulation within the dependent lower lobes. Mild retained secretions in the central airways. No pleural effusion or thickening. Upper Abdomen: Posterior gastric diverticulum. No adrenal nodule. 6.3 cm fluid density lesion arising from the lateral left kidney is likely cyst, although incompletely included in the field of view. The liver appears mildly heterogeneous but no discrete lesion is seen. Musculoskeletal: There are multiple lytic lesions throughout the ribs and spine consistent with osseous metastatic disease. Lytic lesion within T7 has associated mild pathologic compression fracture as well as loss of posterior cortical margin and possible extraosseous soft tissue component. There are lesions throughout multiple thoracic vertebra, posterior elements and many ribs. Fractures of lateral lower left ribs may be in part pathologic, and are likely subacute with surrounding callus formation. The largest rib lesion involves posterior left ninth rib, expansile with soft tissue component. Bilateral scapular lucent lesions with a fracture involving the inferior  right scapular body. Surgical clips in the left breast and axilla. IMPRESSION: 1. A 5.8 cm right upper lobe mass, increased in size from prior chest radiograph, highly suspicious for primary lung malignancy. 2. Pathologic mediastinal adenopathy, typical of metastatic disease. 3. 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 There is a mild pathologic compression fracture of T7. Soft tissue component with loss of posterior cortex and possible extraosseous soft tissue component. Recommend thoracic spine MRI to assess spinal canal patency. Fractures of lateral lower left ribs may be in part pathologic, and are likely subacute with surrounding callus  formation. There is a fracture involving the inferior right scapular body. 4. Coronary artery calcifications. Aortic Atherosclerosis (ICD10-I70.0) and Emphysema (ICD10-J43.9). These results will be called to the ordering clinician or representative by the Radiologist Assistant, and communication documented in the PACS or [employer]. Electronically Signed   By: Chadwick Colonel M.D.   On: 05/22/2024 15:10

## 2024-05-23 NOTE — Assessment & Plan Note (Signed)
 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 Lee Public Patient has been on letrozole  since April 2023, continuing Significant metastatic disease of the spine, oncology to coordinate with radiation oncology for potential radiation therapy

## 2024-05-23 NOTE — Assessment & Plan Note (Signed)
 Severe hypercalcemia of suspected malignancy Thought to be secondary to either progressive metastatic breast cancer or right upper lobe mass concerning for primary lung malignancy. Managing with aggressive intravenous volume resuscitation, fluids increased to 150 cc an hour to target greater than 200 cc an hour of urine output Zometa 4 mg given on 5/27 On calcitonin twice daily started 5/27 to be given for total of 4 doses Serial chemistries to monitor calcium  levels closely

## 2024-05-23 NOTE — Assessment & Plan Note (Signed)
 Substantial acute kidney injury secondary to volume depletion and hypercalcemia with creatinine of 2.74 on arrival Creatinine downtrending Hydrating patient aggressively Strict input and output monitoring Minimizing use of nephrotoxic agents

## 2024-05-23 NOTE — Care Plan (Signed)
 Reviewed CT renal stone scan with Dr. Christiane Cowing.  Pathologic pubic rami fractures can be treated non-operatively.  No weight bearing restrictions.  May proceed with radiation/chemo as needed.

## 2024-05-23 NOTE — Progress Notes (Signed)
  Echocardiogram 2D Echocardiogram has been performed.  Angela Lin 05/23/2024, 3:12 PM

## 2024-05-23 NOTE — Progress Notes (Addendum)
 Angela Lin   DOB:03-10-1961   ZO#:[id number]      ASSESSMENT & PLAN:  Angela Lin is a 63 year old female patient with oncologic history significant for breast cancer.  Admitted on 05/22/2024 with complaints of aggressively worsening low back pain x 3 to 4 weeks and abnormal labs noted in the ED.  Hypercalcemia of malignancy - Elevated calcium  level secondary to malignancy - Admitted with elevated calcium  >15.0. - Status post IV Zometa 4 mg given 5/27 and calcitonin 2 times daily started on 5/27 x 4 doses. - Slight decrease in calcium  to 14.1 today. 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 - Initiated letrozole  April 2023  2.5 mg p.o. daily, continue as ordered -CT chest done 05/22/2024 shows 5.8 cm RUL mass increased in size, highly suspicious for lung malignancy.  Also shows multifocal bone mets throughout the ribs, spine and scapula.  Pathologic fracture of T7, lateral lower left ribs, inferior right scapula. - Pending MR spine today.  Will follow results - Appreciate neurosurgical eval, no role for surgical intervention - Consideration for palliative radiation therapy to spine, pending radiation oncology eval. - Medical oncology/Dr. Lee Public following  AKI - Creatinine and BUN elevated - Avoid nephrotoxic agents - Monitor CMP  Elevated blood glucose levels - Continue to monitor blood glucose levels closely - Medicine managing   Code Status Full  Subjective:  Patient seen awake and alert sitting up in bed.  Reports that she feels "off".  Denies pain, nausea vomiting or other GI symptoms.  No other acute complaints offered or acute distress is noted.  Family member at bedside.  Objective:   Intake/Output Summary (Last 24 hours) at 05/23/2024 1016 Last  data filed at 05/23/2024 0534 Gross per 24 hour  Intake 1268.03 ml  Output --  Net 1268.03 ml     PHYSICAL EXAMINATION: ECOG PERFORMANCE STATUS: 2 - Symptomatic, <50% confined to bed  Vitals:   05/23/24 0505 05/23/24 0947  BP: (!) 140/60 139/76  Pulse: 81 84  Resp: 18 19  Temp: 98.1 F (36.7 C) 97.7 F (36.5 C)  SpO2: 95% 96%   Filed Weights   05/22/24 2300  Weight: 149 lb 14.6 oz (68 kg)    GENERAL: alert, no distress and comfortable SKIN: skin color, texture, turgor are normal, no rashes or significant lesions EYES: normal, conjunctiva are pink and non-injected, sclera clear OROPHARYNX: no exudate, no erythema and lips, buccal mucosa, and tongue normal  NECK: supple, thyroid normal size, non-tender, without nodularity LYMPH: no palpable lymphadenopathy in the cervical, axillary or inguinal LUNGS: clear to auscultation and percussion with normal breathing effort HEART: regular rate & rhythm and no murmurs and no lower extremity edema ABDOMEN: abdomen soft, non-tender and normal bowel sounds MUSCULOSKELETAL: no cyanosis of digits and no clubbing  PSYCH: alert & oriented x 3 with fluent speech NEURO: no focal motor/sensory deficits   All questions were answered. The patient knows to call the clinic with any problems, questions or concerns.   The total time spent in the appointment was 40 minutes encounter with patient including review of chart and various tests results, discussions about plan of care and coordination of care plan  Jacqualin Mate, NP 05/23/2024 10:16 AM    Labs Reviewed:  Lab Results  Component Value Date   WBC  6.6 05/23/2024   HGB 12.6 05/23/2024   HCT 38.2 05/23/2024   MCV 89.0 05/23/2024   PLT 311 05/23/2024   Recent Labs    05/22/24 1736 05/23/24 0026  NA 130* 135  K 3.1* 3.8  CL 90* 101  CO2 29 26  GLUCOSE 118* 103*  BUN 40* 36*  CREATININE 2.74* 2.26*  CALCIUM  >15.0* >15.0*  GFRNONAA 19* 24*  PROT 7.5 6.9  ALBUMIN 3.3* 3.0*   AST 21 18  ALT 27 24  ALKPHOS 151* 140*  BILITOT 0.8 0.4    Studies Reviewed:  CT Renal Stone Study Result Date: 05/22/2024 CLINICAL DATA:  R flank pain, recent cancer diagnosis, aki EXAM: CT ABDOMEN AND PELVIS WITHOUT CONTRAST TECHNIQUE: Multidetector CT imaging of the abdomen and pelvis was performed following the standard protocol without IV contrast. RADIATION DOSE REDUCTION: This exam was performed according to the departmental dose-optimization program which includes automated exposure control, adjustment of the mA and/or kV according to patient size and/or use of iterative reconstruction technique. COMPARISON:  None Available. FINDINGS: Of note, the lack of intravenous contrast limits evaluation of the solid organ parenchyma and vascularity. Lower chest: For findings above the diaphragm, please see the separately dictated CT of the chest report, which was performed concurrently. Hepatobiliary: Subcentimeter hypodensity in the right hepatic lobe measuring 5 mm (axial 9). Left hepatic lobe hypodensity measuring 9 mm (axial 9). Segment 4 hypodensity measuring 1.1 cm (axial 17). 1.3 cm hypodensity abutting the gallbladder fossa in the right hepatic lobe inferiorly (axial 27).Focal fatty infiltration along the falciform ligament.Layering biliary sludge or punctate gallstones. No gallbladder wall thickening. No intrahepatic or extrahepatic biliary ductal dilation. Pancreas: No mass or main ductal dilation. No peripancreatic inflammation or fluid collection. Spleen: Normal size. No mass. Adrenals/Urinary Tract: No adrenal mass. Large cyst in the left upper pole. No hydronephrosis or nephrolithiasis. The urinary bladder is obscured by metallic streak artifact by both hip arthroplasties. Stomach/Bowel:Likely fluid-filled diverticulum arising from the posterior stomach (axial 11). The stomach contains ingested material without focal abnormality. No small bowel wall thickening or inflammation. No small bowel  obstruction.Normal appendix. Vascular/Lymphatic: No aortic aneurysm. Diffuse aortoiliac atherosclerosis. No intraabdominal or pelvic lymphadenopathy. Reproductive: The pelvic organs are obscured by metallic streak artifact from both hip arthroplasties. No free pelvic fluid. Other: No pneumoperitoneum, ascites, or mesenteric inflammation. Musculoskeletal: Destructive soft tissue mass in the right pedicle of L1, measuring 3.5 cm, encroaching into the neural foramen and causing significant spinal canal narrowing (axial 23). A smaller destructive mass in the right pedicle of the L4 vertebral body measures 1.7 cm, encroaching into the right neural foramen and the lateral right spinal canal (axial 39). 2.7 cm lytic lesion in the right L2 vertebral body. Both hip arthroplasties are anatomically aligned without dislocation. Age-indeterminate, fracture of the left superior pubic ramus at the puboacetabular junction. Large destructive mass filling the inferior left pubic ramus measuring 3.5 cm. IMPRESSION: 1. Multiple small hypodensities in the liver measuring up to 1.3 cm in the inferior right hepatic lobe (axial 27), worrisome for metastatic disease. 2. Diffuse bony metastatic disease throughout the abdomen and pelvis. For example, there is a destructive soft tissue mass in the right L1 pedicle, measuring 3.5 cm, causing significant spinal canal and neuroforaminal narrowing (axial 23). 3. Age-indeterminate, but favored to be subacute, fracture of the left superior pubic ramus at the puboacetabular junction. Large destructive mass filling the inferior left pubic ramus, measuring 3.5 cm. Electronically Signed   By: Jodelle Mungo.D.  On: 05/22/2024 19:26   CT CHEST WO CONTRAST Result Date: 05/22/2024 CLINICAL DATA:  Mass of upper lobe of right lung. Abnormal x-ray. History of left-sided breast cancer. Current smoker. EXAM: CT CHEST WITHOUT CONTRAST TECHNIQUE: Multidetector CT imaging of the chest was performed  following the standard protocol without IV contrast. RADIATION DOSE REDUCTION: This exam was performed according to the departmental dose-optimization program which includes automated exposure control, adjustment of the mA and/or kV according to patient size and/or use of iterative reconstruction technique. COMPARISON:  Chest radiograph 02/17/2024 FINDINGS: Cardiovascular: The heart is normal in size. There are coronary artery calcifications. No pericardial effusion. Moderate aortic atherosclerosis. No aortic aneurysm. Mediastinum/Nodes: Pathologic mediastinal adenopathy including a 19 mm short axis right paratracheal node series 2, image 54. There additional prominent mediastinal lymph nodes. Hilar assessment is limited in the absence of IV contrast. Unremarkable appearance of the esophagus. Lungs/Pleura: Right upper lobe mass measures 5.8 x 5.7 cm, series 3, image 76. This causes bowing of the minor fissure. Centrally this lesion abuts the mediastinal fat with loss of fat plane. This is increased in size from prior chest radiograph. 3 mm left upper lobe nodule series 3, image 39. Mild emphysema. Mild subpleural reticulation within the dependent lower lobes. Mild retained secretions in the central airways. No pleural effusion or thickening. Upper Abdomen: Posterior gastric diverticulum. No adrenal nodule. 6.3 cm fluid density lesion arising from the lateral left kidney is likely cyst, although incompletely included in the field of view. The liver appears mildly heterogeneous but no discrete lesion is seen. Musculoskeletal: There are multiple lytic lesions throughout the ribs and spine consistent with osseous metastatic disease. Lytic lesion within T7 has associated mild pathologic compression fracture as well as loss of posterior cortical margin and possible extraosseous soft tissue component. There are lesions throughout multiple thoracic vertebra, posterior elements and many ribs. Fractures of lateral lower left  ribs may be in part pathologic, and are likely subacute with surrounding callus formation. The largest rib lesion involves posterior left ninth rib, expansile with soft tissue component. Bilateral scapular lucent lesions with a fracture involving the inferior right scapular body. Surgical clips in the left breast and axilla. IMPRESSION: 1. A 5.8 cm right upper lobe mass, increased in size from prior chest radiograph, highly suspicious for primary lung malignancy. 2. Pathologic mediastinal adenopathy, typical of metastatic disease. 3. 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 There is a mild pathologic compression fracture of T7. Soft tissue component with loss of posterior cortex and possible extraosseous soft tissue component. Recommend thoracic spine MRI to assess spinal canal patency. Fractures of lateral lower left ribs may be in part pathologic, and are likely subacute with surrounding callus formation. There is a fracture involving the inferior right scapular body. 4. Coronary artery calcifications. Aortic Atherosclerosis (ICD10-I70.0) and Emphysema (ICD10-J43.9). These results will be called to the ordering clinician or representative by the Radiologist Assistant, and communication documented in the PACS or [employer]. Electronically Signed   By: Chadwick Colonel M.D.   On: 05/22/2024 15:10   XR Lumbar Spine 2-3 Views Result Date: 04/27/2024 X-rays of the lumbar spine show diffuse degenerative changes and facet disease.  Grade 1 spondylolisthesis of L4-5 and degenerative disc disease at L5-S1.  No acute abnormalities.  XR HIPS BILAT W OR W/O PELVIS 3-4 VIEWS Result Date: 04/27/2024 X-rays of the pelvis and bilateral hips show status post bilateral total hip arthroplasties without any complications.  Addendum: Attending Note  I personally saw  the patient, reviewed the chart and examined the patient. The plan of care was discussed with the patient and the  admitting team. I agree with the assessment and plan as documented above. Thank you very much for the consultation. Severe hypercalcemia: Secondary to metastatic disease.  Recommend continued IV fluid hydration, IV Zometa and calcitonin MRI is planned to evaluate for pathologic compression fractures: Orthopedic consultation requested If surgery is not an option, Radiation will need to be consulted Biopsy requested: Pulmonary to perform biopsy tomorrow. Based on the final pathology we will discuss systemic treatment options. Goals of care discussion: I discussed with her that metastatic disease cannot be cured and systemic treatments can help prolong her disease.  Specific treatment options will only be determined after the biopsy results are available. Social issues: Patient is on her own and does not have much support.  Her mother is 28 and tries to do as much as she can.  This is what is going to limit her ability to receive treatment.  Further discussion once we have additional pathology result

## 2024-05-23 NOTE — Progress Notes (Signed)
 PROGRESS NOTE   Angela Lin  ATF:[id number] DOB: July 11, 1961 DOA: 05/22/2024 PCP: Helyn Lobstein, MD   Date of Service: the patient was seen and examined on 05/23/2024  Brief Narrative:  63 y.o. 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  Patient follows with Dr. Gudena. Patient had outpatient labs performed revealing severe hypercalcemia patient was instructed to come to the [facility] Emergency Department for evaluation.  Upon evaluation in the emergency department, CT imaging of the chest abdomen and pelvis was performed revealing a right upper lobe mass measuring 5.8 x 5.7 cm in addition to diffuse bony metastatic disease throughout the abdomen and pelvis with a destructive soft tissue mass in the right L1 pedicle causing significant spinal canal and neuroforaminal narrowing.  Case was discussed with Dr. Larrie Po of neurosurgery who recommended oncology and radiation oncology consultations for potential management.  Patient was initiated on intravenous fluids for the severe hypercalcemia and the hospitalist group was then called to assess the patient for admission to the hospital.  Patient was continuing aggressive intravenous volume resuscitation and was additionally provided with zoledronate and calcitonin.  Case was discussed with Dr. Gudena with oncology who would formally evaluate patient in consultation.  Considering the right upper lobe mass, Dr. Bertrum Brodie with PCCM was additionally consulted for recommendations on approach for biopsy.   Assessment & Plan Hypercalcemia of malignancy Severe hypercalcemia of suspected malignancy Thought to be secondary to either progressive metastatic breast cancer or right upper lobe mass concerning for primary lung malignancy. Managing with  aggressive intravenous volume resuscitation, fluids increased to 150 cc an hour to target greater than 200 cc an hour of urine output Zometa 4 mg given on 5/27 On calcitonin twice daily started 5/27 to be given for total of 4 doses Serial chemistries to monitor calcium  levels closely Mass of upper lobe of right lung Seen on CT imaging measuring 5.8 x 5.7 cm  concern for primary lung malignancy Case discussed with Dr. Bertrum Brodie with PCCM who has evaluated the patient and recommends EBUS and navigational bronchoscopy Patient to undergo procedure in the endoscopy suite at [facility] on 5/29 N.p.o. after midnight AKI (acute kidney injury) (HCC) Substantial acute kidney injury secondary to volume depletion and hypercalcemia with creatinine of 2.74 on arrival Creatinine downtrending Hydrating patient aggressively Strict input and output monitoring Minimizing use of nephrotoxic agents Pathological fracture, pelvis, initial encounter for fracture CT imaging revealing fracture of the left superior pubic ramus at the pubic acetabular junction as well as a large destructive mass filling the inferior pubic left pubic ramus measuring 3.5 cm Patient evaluated by orthopedic surgery, supportive care recommended, their input is appreciated. 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 Lee Public Patient has been on letrozole  since April 2023, continuing Significant metastatic disease of the spine, oncology to coordinate with radiation oncology for potential radiation therapy Essential hypertension Continue amlodipine , metoprolol  As needed intravenous hydralazine for markedly elevated blood pressures. Hypokalemia Replacing with potassium chloride Evaluating for concurrent hypomagnesemia  Monitoring potassium levels with serial chemistries.      Subjective:  Patient complaining of tremors and generalized weakness.  Patient additionally complaining of intermittent severe back pain, improving with as needed opiate-based analgesics.  Physical Exam:  Vitals:   05/22/24 2300 05/23/24 0505  05/23/24 0947 05/23/24 1106  BP:  (!) 140/60 139/76 139/76  Pulse:  81 84 84  Resp:  18 19   Temp:  98.1 F (36.7 C) 97.7 F (36.5 C)   TempSrc:  Oral Oral   SpO2:  95% 96%   Weight: 68 kg     Height: 5\' 5"  (1.651 m)       Constitutional: Patient is lethargic but arousable and oriented x 3, patient is notably tremulous. Skin: no rashes, no lesions, good skin turgor noted. Eyes: Pupils are equally reactive to light.  No evidence of scleral icterus or conjunctival pallor.  ENMT: Moist mucous membranes noted.  Posterior pharynx clear of any exudate or lesions.   Respiratory: clear to auscultation bilaterally, no wheezing, no crackles. Normal respiratory effort. No accessory muscle use.  Cardiovascular: Regular rate and rhythm, no murmurs / rubs / gallops. No extremity edema. 2+ pedal pulses. No carotid bruits.  Abdomen: Abdomen is soft and nontender.  No evidence of intra-abdominal masses.  Positive bowel sounds noted in all quadrants.   Musculoskeletal: No joint deformity upper and lower extremities. Good ROM, no contractures. Normal muscle tone.    Data Reviewed:  I have personally reviewed and interpreted labs, imaging.  Significant findings are   CBC: Recent Labs  Lab 05/22/24 1736 05/23/24 0026  WBC 9.0 6.6  NEUTROABS 6.6  --   HGB 12.8 12.6  HCT 37.5 38.2  MCV 85.2 89.0  PLT 351 311   Basic Metabolic Panel: Recent Labs  Lab 05/22/24 1736 05/23/24 0026 05/23/24 0851  NA 130* 135 135  K 3.1* 3.8 3.7  CL 90* 101 103  CO2 29 26 24   GLUCOSE 118* 103* 96  BUN 40* 36* 30*  CREATININE 2.74* 2.26* 2.09*  CALCIUM  >15.0* >15.0* 14.1*   GFR: Estimated Creatinine Clearance: 25.1 mL/min (A) (by C-G formula based on SCr of  2.09 mg/dL (H)). Liver Function Tests: Recent Labs  Lab 05/22/24 1736 05/23/24 0026  AST 21 18  ALT 27 24  ALKPHOS 151* 140*  BILITOT 0.8 0.4  PROT 7.5 6.9  ALBUMIN 3.3* 3.0*     Code Status:  Full code.  Code status decision has been confirmed with: patient    Severity of Illness:  The appropriate patient status for this patient is INPATIENT. Inpatient status is judged to be reasonable and necessary in order to provide the required intensity of service to ensure the patient's safety. The patient's presenting symptoms, physical exam findings, and initial radiographic and laboratory data in the context of their chronic comorbidities is felt to place them at high risk for further clinical deterioration. Furthermore, it is not anticipated that the patient will be medically stable for discharge from the hospital within 2 midnights of admission.   * I certify that at the point of admission it is my clinical judgment that the patient will require inpatient hospital care spanning beyond 2 midnights from the point of admission due to high intensity of service, high risk for further deterioration and high frequency of surveillance required.*  Time spent:  59 minutes  Author:  True Fuss MD  05/23/2024 12:20 PM

## 2024-05-23 NOTE — Assessment & Plan Note (Signed)
·   Replacing with potassium chloride °· Evaluating for concurrent hypomagnesemia  °· Monitoring potassium levels with serial chemistries. ° °

## 2024-05-23 NOTE — Assessment & Plan Note (Signed)
 Seen on CT imaging measuring 5.8 x 5.7 cm  concern for primary lung malignancy Case discussed with Dr. Bertrum Brodie with PCCM who has evaluated the patient and recommends EBUS and navigational bronchoscopy Patient to undergo procedure in the endoscopy suite at [facility] on 5/29 N.p.o. after midnight

## 2024-05-23 NOTE — Assessment & Plan Note (Signed)
 Continue amlodipine , metoprolol  As needed intravenous hydralazine  for markedly elevated blood pressures.

## 2024-05-23 NOTE — Assessment & Plan Note (Signed)
 CT imaging revealing fracture of the left superior pubic ramus at the pubic acetabular junction as well as a large destructive mass filling the inferior pubic left pubic ramus measuring 3.5 cm Patient evaluated by orthopedic surgery, supportive care recommended, their input is appreciated.

## 2024-05-24 ENCOUNTER — Inpatient Hospital Stay (HOSPITAL_COMMUNITY): Admitting: Anesthesiology

## 2024-05-24 ENCOUNTER — Encounter (HOSPITAL_COMMUNITY): Payer: Self-pay | Admitting: Internal Medicine

## 2024-05-24 ENCOUNTER — Inpatient Hospital Stay (HOSPITAL_COMMUNITY)

## 2024-05-24 ENCOUNTER — Encounter (HOSPITAL_COMMUNITY)
Admission: EM | Disposition: A | Discharge status: Hospice | Payer: Self-pay | Source: Home / Self Care | Attending: Internal Medicine

## 2024-05-24 DIAGNOSIS — R590 Localized enlarged lymph nodes: Secondary | ICD-10-CM

## 2024-05-24 DIAGNOSIS — F17210 Nicotine dependence, cigarettes, uncomplicated: Secondary | ICD-10-CM | POA: Diagnosis not present

## 2024-05-24 DIAGNOSIS — E876 Hypokalemia: Secondary | ICD-10-CM | POA: Diagnosis not present

## 2024-05-24 DIAGNOSIS — N179 Acute kidney failure, unspecified: Secondary | ICD-10-CM | POA: Diagnosis not present

## 2024-05-24 DIAGNOSIS — I10 Essential (primary) hypertension: Secondary | ICD-10-CM | POA: Diagnosis not present

## 2024-05-24 DIAGNOSIS — R918 Other nonspecific abnormal finding of lung field: Secondary | ICD-10-CM | POA: Diagnosis not present

## 2024-05-24 HISTORY — PX: BRONCHIAL BRUSHINGS: SHX5108

## 2024-05-24 HISTORY — PX: BRONCHIAL WASHINGS: SHX5105

## 2024-05-24 HISTORY — PX: BRONCHIAL BIOPSY: SHX5109

## 2024-05-24 HISTORY — PX: VIDEO BRONCHOSCOPY WITH RADIAL ENDOBRONCHIAL ULTRASOUND: SHX6849

## 2024-05-24 HISTORY — PX: BRONCHIAL NEEDLE ASPIRATION BIOPSY: SHX5106

## 2024-05-24 HISTORY — PX: VIDEO BRONCHOSCOPY WITH ENDOBRONCHIAL NAVIGATION: SHX6175

## 2024-05-24 HISTORY — PX: VIDEO BRONCHOSCOPY WITH ENDOBRONCHIAL ULTRASOUND: SHX6177

## 2024-05-24 LAB — CBC WITH DIFFERENTIAL/PLATELET
Abs Immature Granulocytes: 0.11 10*3/uL — ABNORMAL HIGH (ref 0.00–0.07)
Basophils Absolute: 0 10*3/uL (ref 0.0–0.1)
Basophils Relative: 0 %
Eosinophils Absolute: 0.1 10*3/uL (ref 0.0–0.5)
Eosinophils Relative: 1 %
HCT: 32.6 % — ABNORMAL LOW (ref 36.0–46.0)
Hemoglobin: 10.8 g/dL — ABNORMAL LOW (ref 12.0–15.0)
Immature Granulocytes: 2 %
Lymphocytes Relative: 5 %
Lymphs Abs: 0.4 10*3/uL — ABNORMAL LOW (ref 0.7–4.0)
MCH: 29.3 pg (ref 26.0–34.0)
MCHC: 33.1 g/dL (ref 30.0–36.0)
MCV: 88.6 fL (ref 80.0–100.0)
Monocytes Absolute: 0.5 10*3/uL (ref 0.1–1.0)
Monocytes Relative: 8 %
Neutro Abs: 5.9 10*3/uL (ref 1.7–7.7)
Neutrophils Relative %: 84 %
Platelets: 275 10*3/uL (ref 150–400)
RBC: 3.68 MIL/uL — ABNORMAL LOW (ref 3.87–5.11)
RDW: 12.1 % (ref 11.5–15.5)
WBC: 7 10*3/uL (ref 4.0–10.5)
nRBC: 0 % (ref 0.0–0.2)

## 2024-05-24 LAB — PROTIME-INR
INR: 1.1 (ref 0.8–1.2)
Prothrombin Time: 14.3 s (ref 11.4–15.2)

## 2024-05-24 LAB — COMPREHENSIVE METABOLIC PANEL WITH GFR
ALT: 17 U/L (ref 0–44)
AST: 17 U/L (ref 15–41)
Albumin: 2.7 g/dL — ABNORMAL LOW (ref 3.5–5.0)
Alkaline Phosphatase: 117 U/L (ref 38–126)
Anion gap: 8 (ref 5–15)
BUN: 22 mg/dL (ref 8–23)
CO2: 21 mmol/L — ABNORMAL LOW (ref 22–32)
Calcium: 12 mg/dL — ABNORMAL HIGH (ref 8.9–10.3)
Chloride: 104 mmol/L (ref 98–111)
Creatinine, Ser: 1.73 mg/dL — ABNORMAL HIGH (ref 0.44–1.00)
GFR, Estimated: 33 mL/min — ABNORMAL LOW (ref >60)
Glucose, Bld: 92 mg/dL (ref 70–99)
Potassium: 3.1 mmol/L — ABNORMAL LOW (ref 3.5–5.1)
Sodium: 133 mmol/L — ABNORMAL LOW (ref 135–145)
Total Bilirubin: 0.6 mg/dL (ref 0.0–1.2)
Total Protein: 6 g/dL — ABNORMAL LOW (ref 6.5–8.1)

## 2024-05-24 LAB — TROPONIN I (HIGH SENSITIVITY): Troponin I (High Sensitivity): 23 ng/L — ABNORMAL HIGH (ref <18)

## 2024-05-24 SURGERY — BRONCHOSCOPY, WITH EBUS
Anesthesia: General | Laterality: Right

## 2024-05-24 MED ORDER — PHENYLEPHRINE 80 MCG/ML (10ML) SYRINGE FOR IV PUSH (FOR BLOOD PRESSURE SUPPORT)
PREFILLED_SYRINGE | INTRAVENOUS | Status: DC | PRN
  Administered 2024-05-24: 160 ug via INTRAVENOUS

## 2024-05-24 MED ORDER — POTASSIUM CHLORIDE CRYS ER 20 MEQ PO TBCR
40.0000 meq | EXTENDED_RELEASE_TABLET | Freq: Once | ORAL | Status: AC
  Administered 2024-05-24: 40 meq via ORAL
  Filled 2024-05-24: qty 2

## 2024-05-24 MED ORDER — LACTATED RINGERS IV SOLN: INTRAVENOUS | Status: DC | PRN

## 2024-05-24 MED ORDER — HYDROMORPHONE HCL 1 MG/ML IJ SOLN
0.5000 mg | Freq: Once | INTRAMUSCULAR | Status: AC
  Administered 2024-05-24: 0.5 mg via INTRAVENOUS
  Filled 2024-05-24: qty 0.5

## 2024-05-24 MED ORDER — MIDAZOLAM HCL 2 MG/2ML IJ SOLN
INTRAMUSCULAR | Status: DC | PRN
  Administered 2024-05-24: 2 mg via INTRAVENOUS

## 2024-05-24 MED ORDER — FENTANYL CITRATE (PF) 250 MCG/5ML IJ SOLN
INTRAMUSCULAR | Status: DC | PRN
  Administered 2024-05-24: 50 ug via INTRAVENOUS

## 2024-05-24 MED ORDER — SODIUM CHLORIDE 0.9 % IV SOLN: INTRAVENOUS | Status: DC

## 2024-05-24 MED ORDER — PROPOFOL 10 MG/ML IV BOLUS
INTRAVENOUS | Status: DC | PRN
  Administered 2024-05-24: 110 mg via INTRAVENOUS
  Administered 2024-05-24: 100 ug/kg/min via INTRAVENOUS

## 2024-05-24 MED ORDER — ONDANSETRON HCL 4 MG/2ML IJ SOLN
INTRAMUSCULAR | Status: DC | PRN
  Administered 2024-05-24: 4 mg via INTRAVENOUS

## 2024-05-24 MED ORDER — LIDOCAINE 2% (20 MG/ML) 5 ML SYRINGE
INTRAMUSCULAR | Status: DC | PRN
  Administered 2024-05-24: 60 mg via INTRAVENOUS

## 2024-05-24 MED ORDER — DEXTROSE IN LACTATED RINGERS 5 % IV SOLN: INTRAVENOUS | Status: DC

## 2024-05-24 MED ORDER — DEXAMETHASONE SODIUM PHOSPHATE 10 MG/ML IJ SOLN
INTRAMUSCULAR | Status: DC | PRN
  Administered 2024-05-24: 10 mg via INTRAVENOUS

## 2024-05-24 MED ORDER — MIDAZOLAM HCL 2 MG/2ML IJ SOLN
INTRAMUSCULAR | Status: AC
Start: 2024-05-24 — End: ?
  Filled 2024-05-24: qty 2

## 2024-05-24 MED ORDER — SUGAMMADEX SODIUM 200 MG/2ML IV SOLN
INTRAVENOUS | Status: DC | PRN
  Administered 2024-05-24: 200 mg via INTRAVENOUS

## 2024-05-24 MED ORDER — FENTANYL CITRATE (PF) 100 MCG/2ML IJ SOLN
INTRAMUSCULAR | Status: AC
  Filled 2024-05-24: qty 2

## 2024-05-24 MED ORDER — PHENYLEPHRINE HCL-NACL 20-0.9 MG/250ML-% IV SOLN
INTRAVENOUS | Status: DC | PRN
  Administered 2024-05-24: 40 ug/min via INTRAVENOUS

## 2024-05-24 MED ORDER — ROCURONIUM BROMIDE 10 MG/ML (PF) SYRINGE
PREFILLED_SYRINGE | INTRAVENOUS | Status: DC | PRN
  Administered 2024-05-24: 20 mg via INTRAVENOUS
  Administered 2024-05-24: 50 mg via INTRAVENOUS
  Administered 2024-05-24: 10 mg via INTRAVENOUS

## 2024-05-24 MED ORDER — POTASSIUM CHLORIDE IN NACL 20-0.9 MEQ/L-% IV SOLN
INTRAVENOUS | Status: DC
  Filled 2024-05-24 (×2): qty 1000

## 2024-05-24 NOTE — Assessment & Plan Note (Addendum)
 Persisting  replacing with potassium chloride Evaluating for concurrent hypomagnesemia  Monitoring potassium levels with serial chemistries.

## 2024-05-24 NOTE — Plan of Care (Signed)

## 2024-05-24 NOTE — Progress Notes (Signed)
 NAME:  Angela Lin, MRN:  [id number], DOB:  12-Mar-1961, LOS: 2 ADMISSION DATE:  05/22/2024, CONSULTATION DATE:  05/23/24 REFERRING MD:  TRiad MD Dr Alfred Imperial, CHIEF COMPLAINT:  Lung mass, bony meets. Mediastninal adenopathy   History of Present Illness:   63 year old female lives alone. Only blood family is 36 year old mom and brother in town. Has history of breast cancer followed by Dr Lee Public. Has ongoing > 20 pack smoking history.  She is on letrozole  since 2023 and status post lumpectomy and radiation for breast cancer.  She came into the ER with new onset of low back pain for 4 weeks without any urine diarrhea or stool incontinence.  She also had sciatica to the right lower extremity.  Denied any vomiting fever or chills.  Found to have hypercalcemia with a calcium  greater than 15.  Also acute kidney injury with a creatinine of 2.7 mg percent.  Also had worsening metastatic lesion to the spine T7 compression fracture and also spinal canal and neural narrowing in the lumbar area.  There was also a subacute fracture of the left superior rami and destructive lesion of the left inferior rami with metastasis.  She has been started on fluids, zoledronate and calcitonin.  She is pending an MRI.  CT scan of the chest showed Right upper lobe mass measures 5.8 x 5.7 cm, series 3, image 76. This causes bowing of the minor fissure. Centrally this lesion abuts the mediastinal fat with loss of fat plane.  In addition the mediastinal adenopathy [all present visualized].  Pulmonary has been consulted.  Upon walking to the room mother and patient extremely anxious about social situation upon discharge.  Fears of being able to manage illness alone.  They also declined MRI.  Educated about need for MRI and they were agreeable.   Past Medical History:    has a past medical history of Arthritis, Endometriosis, Heart murmur, and Hypertension.   has a past surgical history that includes Diagnostic laparoscopy;  Total hip arthroplasty (Right, 06/04/2016); Total hip arthroplasty (Left, 12/08/2018); Breast biopsy (Left, 03/22/2022); and Breast lumpectomy (Left, 05/26/2022).  Tobacco Use: High Risk (05/22/2024)   Patient History    Smoking Tobacco Use: Every Day    Smokeless Tobacco Use: Never    Passive Exposure: Not on file    Allergies  Allergen Reactions   Hydrochlorothiazide Other (See Comments)    Unknown        Significant Hospital Events:  05/22/2024 - admit 05/23/24 - Pulm consult  -  Interim History / Subjective:   05/24/2024 - Awaits EBUS/Nav by Dr Lucina Sabal at [facility]. NPO status - RN says patient only had sips with meds. Plat and INR normal. Heparin on hold. Called Endo - procedure time 13:45 today. Care link will get patient. RN was updated about this. Patient brother at bedside  Calcium  better at 60 Sleepy this am after being awake last night per RN    Objective       05/24/2024    3:53 AM 05/23/2024    8:54 PM 05/23/2024   11:06 AM  Vitals with BMI  Systolic 129 135 244  Diastolic 71 78 76  Pulse 97 102 84    Today's Vitals   05/23/24 2357 05/24/24 0150 05/24/24 0353 05/24/24 0811  BP:   129/71   Pulse:   97   Resp:   18   Temp:   99.1 F (37.3 C)   TempSrc:   Oral  SpO2:   93%   Weight:      Height:      PainSc: 4  9   Asleep   Body mass index is 24.95 kg/m.   General Appearance:  Looks sleepy Head:  Normocephalic, without obvious abnormality, atraumatic Eyes:  PERRL - yes, conjunctiva/corneas - muddy     Ears:  Normal external ear canals, both ears Nose:  G tube - no Throat:  ETT TUBE - no , OG tube - no Neck:  Supple,  No enlargement/tenderness/nodules Lungs: Clear to auscultation bilaterally,  Heart:  S1 and S2 normal, no murmur, CVP - no.  Pressors - no Abdomen:  Soft, no masses, no organomegaly Genitalia / Rectal:  Not done Extremities:  Extremities- intact Skin:  ntact in exposed areas . Sacral area - x Neurologic:  Sedation -  opiod prn -> RASS - -2 . Moves all 4s - yes. CAM-ICU - x . Orientation - arousable      Assessment & Plan:   PULMONARY  A:  Strong smoking history History of breast cancer status postlumpectomy and radiation on letrozole  since April 2023 Presents with - Right upper lobe mass measures 5.8 x 5.7 cm and mediastinal adenopathy, bony metastasis to the hip and spine along with hypercalcemia   05/24/2024 ->  for EBUS/NVA today  P:   AWAIT  EBUS and navigational bronchoscopy  by Dr Ayesha Lente at [facility]   Risks of pneumothorax, hemothorax, sedation/anesthesia complications such as cardiac or respiratory arrest or hypotension, stroke and bleeding all explained. Benefits of diagnosis but limitations of non-diagnosis also explained. Patient verbalized understanding and wished to proceed. - to berother 05/24/2024   Chagne IV fluids to D5 LR At 100cc/hospital (dc normal saline at 100cc/h)   Hypercalcemia  Plan  - Control according to the hospitalist   Best practice (daily eval):  Diet: NPO after 1am 05/24/24 Pain/Anxiety/Delirium protocol (if indicated): per triad (She is very anxious) VAP protocol (if indicated): x DVT prophylaxis: Stop heparin 5/28 in anticipatin of procedure GI prophylaxis: per triad Glucose control:  per triad Mobility: per triad Code Status: full Family Communication:  5/28 mother and patient 5/29 - Brother at bedside Disposition: med surg [place] per triad   SIGNATURE    Dr. Maire Scot, M.D., F.C.C.P,  Pulmonary and Critical Care Medicine Staff Physician, [facility] Center Director - Interstitial Lung Disease  Program  Pulmonary Fibrosis [facility] at [place], [place], [address]  NPI Number:  NPI #[id number]  Pager: [phone], If no answer  -> Check AMION or Try [phone] Telephone (clinical office): [phone] Telephone (research): [phone]  10:24 AM 05/24/2024   05/24/2024 10:24  AM    LABS    PULMONARY No results for input(s): "PHART", "PCO2ART", "PO2ART", "HCO3", "TCO2", "O2SAT" in the last 168 hours.  Invalid input(s): "PCO2", "PO2"  CBC Recent Labs  Lab 05/22/24 1736 05/23/24 0026 05/24/24 0405  HGB 12.8 12.6 10.8*  HCT 37.5 38.2 32.6*  WBC 9.0 6.6 7.0  PLT 351 311 275    COAGULATION Recent Labs  Lab 05/24/24 0405  INR 1.1    CARDIAC  No results for input(s): "TROPONINI" in the last 168 hours. No results for input(s): "PROBNP" in the last 168 hours.  CHEMISTRY Recent Labs  Lab 05/22/24 1736 05/23/24 0026 05/23/24 0851 05/23/24 1738 05/24/24 0405  NA 130* 135 135 134* 133*  K 3.1* 3.8 3.7 3.2* 3.1*  CL 90* 101  103 102 104  CO2 29 26 24 24  21*  GLUCOSE 118* 103* 96 96 92  BUN 40* 36* 30* 26* 22  CREATININE 2.74* 2.26* 2.09* 2.14* 1.73*  CALCIUM  >15.0* >15.0* 14.1* 13.1* 12.0*   Estimated Creatinine Clearance: 30.3 mL/min (A) (by C-G formula based on SCr of 1.73 mg/dL (H)).   LIVER Recent Labs  Lab 05/22/24 1736 05/23/24 0026 05/24/24 0405  AST 21 18 17   ALT 27 24 17   ALKPHOS 151* 140* 117  BILITOT 0.8 0.4 0.6  PROT 7.5 6.9 6.0*  ALBUMIN 3.3* 3.0* 2.7*  INR  --   --  1.1     INFECTIOUS No results for input(s): "LATICACIDVEN", "PROCALCITON" in the last 168 hours.   ENDOCRINE CBG (last 3)  No results for input(s): "GLUCAP" in the last 72 hours.       IMAGING x48h  - image(s) personally visualized  -   highlighted in bold ECHOCARDIOGRAM COMPLETE Result Date: 05/23/2024    ECHOCARDIOGRAM REPORT   Patient Name:   [facility] SCARLET Yon Date of Exam: 05/23/2024 Medical Rec #:  [id number]              Height:       65.0 in Accession #:    [id number]             Weight:       149.9 lb Date of Birth:  06/30/1961              BSA:          1.750 m Patient Age:    63 years               BP:           139/76 mmHg Patient Gender: F                      HR:           100 bpm. Exam Location:  Inpatient Procedure: 2D Echo,  Cardiac Doppler and Color Doppler (Both Spectral and Color            Flow Doppler were utilized during procedure). Indications:    R06.02 SOB  History:        Patient has no prior history of Echocardiogram examinations.                 Breast cancer.  Sonographer:    Raynelle Callow RDCS Referring Phys: [phone] [facility]  Sonographer Comments: Technically difficult study due to poor echo windows. Patient in pain, supine. IMPRESSIONS  1. Left ventricular ejection fraction, by estimation, is 60 to 65%. The left ventricle has normal function. The left ventricle has no regional wall motion abnormalities. Left ventricular diastolic parameters are consistent with Grade I diastolic dysfunction (impaired relaxation).  2. Right ventricular systolic function is normal. The right ventricular size is normal. Tricuspid regurgitation signal is inadequate for assessing PA pressure. The estimated right ventricular systolic pressure is 9.7 mmHg.  3. The mitral valve is grossly normal. Trivial mitral valve regurgitation. No evidence of mitral stenosis.  4. The aortic valve is grossly normal. Aortic valve regurgitation is not visualized. No aortic stenosis is present.  5. The inferior vena cava is normal in size with greater than 50% respiratory variability, suggesting right atrial pressure of 3 mmHg. FINDINGS  Left Ventricle: Left ventricular ejection fraction, by estimation, is 60 to 65%. The left ventricle has normal function. The left ventricle has no regional  wall motion abnormalities. The left ventricular internal cavity size was normal in size. There is  no left ventricular hypertrophy. Left ventricular diastolic parameters are consistent with Grade I diastolic dysfunction (impaired relaxation). Normal left ventricular filling pressure. Right Ventricle: The right ventricular size is normal. No increase in right ventricular wall thickness. Right ventricular systolic function is normal. Tricuspid regurgitation signal is inadequate  for assessing PA pressure. The tricuspid regurgitant velocity is 1.29 m/s, and with an assumed right atrial pressure of 3 mmHg, the estimated right ventricular systolic pressure is 9.7 mmHg. Left Atrium: Left atrial size was normal in size. Right Atrium: Right atrial size was normal in size. Pericardium: There is no evidence of pericardial effusion. Mitral Valve: The mitral valve is grossly normal. Trivial mitral valve regurgitation. No evidence of mitral valve stenosis. Tricuspid Valve: The tricuspid valve is not well visualized. Tricuspid valve regurgitation is trivial. No evidence of tricuspid stenosis. Aortic Valve: The aortic valve is grossly normal. Aortic valve regurgitation is not visualized. No aortic stenosis is present. Pulmonic Valve: The pulmonic valve was not well visualized. Pulmonic valve regurgitation is not visualized. No evidence of pulmonic stenosis. Aorta: The aortic root and ascending aorta are structurally normal, with no evidence of dilitation. Venous: The inferior vena cava is normal in size with greater than 50% respiratory variability, suggesting right atrial pressure of 3 mmHg. IAS/Shunts: The atrial septum is grossly normal.  LEFT VENTRICLE PLAX 2D LVIDd:         3.90 cm     Diastology LVIDs:         2.70 cm     LV e' medial:    8.49 cm/s LV PW:         0.90 cm     LV E/e' medial:  7.8 LV IVS:        0.90 cm     LV e' lateral:   9.79 cm/s LVOT diam:     2.10 cm     LV E/e' lateral: 6.7 LV SV:         70 LV SV Index:   40 LVOT Area:     3.46 cm  LV Volumes (MOD) LV vol d, MOD A2C: 44.3 ml LV vol d, MOD A4C: 44.0 ml LV vol s, MOD A2C: 15.6 ml LV vol s, MOD A4C: 16.1 ml LV SV MOD A2C:     28.7 ml LV SV MOD A4C:     44.0 ml LV SV MOD BP:      29.3 ml RIGHT VENTRICLE             IVC RV S prime:     17.50 cm/s  IVC diam: 1.40 cm TAPSE (M-mode): 2.8 cm LEFT ATRIUM           Index        RIGHT ATRIUM           Index LA diam:      3.00 cm 1.71 cm/m   RA Area:     11.30 cm LA Vol (A2C): 12.6 ml  7.20 ml/m   RA Volume:   24.00 ml  13.71 ml/m LA Vol (A4C): 31.4 ml 17.94 ml/m  AORTIC VALVE LVOT Vmax:   119.00 cm/s LVOT Vmean:  79.300 cm/s LVOT VTI:    0.202 m  AORTA Ao Root diam: 3.20 cm Ao Asc diam:  2.70 cm MITRAL VALVE                TRICUSPID VALVE MV Area (  PHT): 3.61 cm     TR Peak grad:   6.7 mmHg MV Decel Time: 210 msec     TR Vmax:        129.00 cm/s MV E velocity: 65.80 cm/s MV A velocity: 102.60 cm/s  SHUNTS MV E/A ratio:  0.64         Systemic VTI:  0.20 m                             Systemic Diam: 2.10 cm Maudine Sos MD Electronically signed by Maudine Sos MD Signature Date/Time: 05/23/2024/5:14:59 PM    Final    CT Renal Stone Study Result Date: 05/22/2024 CLINICAL DATA:  R flank pain, recent cancer diagnosis, aki EXAM: CT ABDOMEN AND PELVIS WITHOUT CONTRAST TECHNIQUE: Multidetector CT imaging of the abdomen and pelvis was performed following the standard protocol without IV contrast. RADIATION DOSE REDUCTION: This exam was performed according to the departmental dose-optimization program which includes automated exposure control, adjustment of the mA and/or kV according to patient size and/or use of iterative reconstruction technique. COMPARISON:  None Available. FINDINGS: Of note, the lack of intravenous contrast limits evaluation of the solid organ parenchyma and vascularity. Lower chest: For findings above the diaphragm, please see the separately dictated CT of the chest report, which was performed concurrently. Hepatobiliary: Subcentimeter hypodensity in the right hepatic lobe measuring 5 mm (axial 9). Left hepatic lobe hypodensity measuring 9 mm (axial 9). Segment 4 hypodensity measuring 1.1 cm (axial 17). 1.3 cm hypodensity abutting the gallbladder fossa in the right hepatic lobe inferiorly (axial 27).Focal fatty infiltration along the falciform ligament.Layering biliary sludge or punctate gallstones. No gallbladder wall thickening. No intrahepatic or extrahepatic biliary  ductal dilation. Pancreas: No mass or main ductal dilation. No peripancreatic inflammation or fluid collection. Spleen: Normal size. No mass. Adrenals/Urinary Tract: No adrenal mass. Large cyst in the left upper pole. No hydronephrosis or nephrolithiasis. The urinary bladder is obscured by metallic streak artifact by both hip arthroplasties. Stomach/Bowel:Likely fluid-filled diverticulum arising from the posterior stomach (axial 11). The stomach contains ingested material without focal abnormality. No small bowel wall thickening or inflammation. No small bowel obstruction.Normal appendix. Vascular/Lymphatic: No aortic aneurysm. Diffuse aortoiliac atherosclerosis. No intraabdominal or pelvic lymphadenopathy. Reproductive: The pelvic organs are obscured by metallic streak artifact from both hip arthroplasties. No free pelvic fluid. Other: No pneumoperitoneum, ascites, or mesenteric inflammation. Musculoskeletal: Destructive soft tissue mass in the right pedicle of L1, measuring 3.5 cm, encroaching into the neural foramen and causing significant spinal canal narrowing (axial 23). A smaller destructive mass in the right pedicle of the L4 vertebral body measures 1.7 cm, encroaching into the right neural foramen and the lateral right spinal canal (axial 39). 2.7 cm lytic lesion in the right L2 vertebral body. Both hip arthroplasties are anatomically aligned without dislocation. Age-indeterminate, fracture of the left superior pubic ramus at the puboacetabular junction. Large destructive mass filling the inferior left pubic ramus measuring 3.5 cm. IMPRESSION: 1. Multiple small hypodensities in the liver measuring up to 1.3 cm in the inferior right hepatic lobe (axial 27), worrisome for metastatic disease. 2. Diffuse bony metastatic disease throughout the abdomen and pelvis. For example, there is a destructive soft tissue mass in the right L1 pedicle, measuring 3.5 cm, causing significant spinal canal and neuroforaminal  narrowing (axial 23). 3. Age-indeterminate, but favored to be subacute, fracture of the left superior pubic ramus at the puboacetabular junction. Large destructive mass filling the  inferior left pubic ramus, measuring 3.5 cm. Electronically Signed   By: Rance Burrows M.D.   On: 05/22/2024 19:26   CT CHEST WO CONTRAST Result Date: 05/22/2024 CLINICAL DATA:  Mass of upper lobe of right lung. Abnormal x-ray. History of left-sided breast cancer. Current smoker. EXAM: CT CHEST WITHOUT CONTRAST TECHNIQUE: Multidetector CT imaging of the chest was performed following the standard protocol without IV contrast. RADIATION DOSE REDUCTION: This exam was performed according to the departmental dose-optimization program which includes automated exposure control, adjustment of the mA and/or kV according to patient size and/or use of iterative reconstruction technique. COMPARISON:  Chest radiograph 02/17/2024 FINDINGS: Cardiovascular: The heart is normal in size. There are coronary artery calcifications. No pericardial effusion. Moderate aortic atherosclerosis. No aortic aneurysm. Mediastinum/Nodes: Pathologic mediastinal adenopathy including a 19 mm short axis right paratracheal node series 2, image 54. There additional prominent mediastinal lymph nodes. Hilar assessment is limited in the absence of IV contrast. Unremarkable appearance of the esophagus. Lungs/Pleura: Right upper lobe mass measures 5.8 x 5.7 cm, series 3, image 76. This causes bowing of the minor fissure. Centrally this lesion abuts the mediastinal fat with loss of fat plane. This is increased in size from prior chest radiograph. 3 mm left upper lobe nodule series 3, image 39. Mild emphysema. Mild subpleural reticulation within the dependent lower lobes. Mild retained secretions in the central airways. No pleural effusion or thickening. Upper Abdomen: Posterior gastric diverticulum. No adrenal nodule. 6.3 cm fluid density lesion arising from the lateral left  kidney is likely cyst, although incompletely included in the field of view. The liver appears mildly heterogeneous but no discrete lesion is seen. Musculoskeletal: There are multiple lytic lesions throughout the ribs and spine consistent with osseous metastatic disease. Lytic lesion within T7 has associated mild pathologic compression fracture as well as loss of posterior cortical margin and possible extraosseous soft tissue component. There are lesions throughout multiple thoracic vertebra, posterior elements and many ribs. Fractures of lateral lower left ribs may be in part pathologic, and are likely subacute with surrounding callus formation. The largest rib lesion involves posterior left ninth rib, expansile with soft tissue component. Bilateral scapular lucent lesions with a fracture involving the inferior right scapular body. Surgical clips in the left breast and axilla. IMPRESSION: 1. A 5.8 cm right upper lobe mass, increased in size from prior chest radiograph, highly suspicious for primary lung malignancy. 2. Pathologic mediastinal adenopathy, typical of metastatic disease. 3. 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 There is a mild pathologic compression fracture of T7. Soft tissue component with loss of posterior cortex and possible extraosseous soft tissue component. Recommend thoracic spine MRI to assess spinal canal patency. Fractures of lateral lower left ribs may be in part pathologic, and are likely subacute with surrounding callus formation. There is a fracture involving the inferior right scapular body. 4. Coronary artery calcifications. Aortic Atherosclerosis (ICD10-I70.0) and Emphysema (ICD10-J43.9). These results will be called to the ordering clinician or representative by the Radiologist Assistant, and communication documented in the PACS or [employer]. Electronically Signed   By: Chadwick Colonel M.D.   On: 05/22/2024 15:10

## 2024-05-24 NOTE — Assessment & Plan Note (Signed)
 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 Lee Public Patient has been on letrozole  since April 2023, continuing Significant metastatic disease of the spine, oncology to coordinate with radiation oncology for potential radiation therapy

## 2024-05-24 NOTE — Anesthesia Preprocedure Evaluation (Addendum)
 Anesthesia Evaluation  Patient identified by MRN, date of birth, ID band Patient awake    Reviewed: Allergy  & Precautions, H&P , NPO status , Patient's Chart, lab work & pertinent test results, reviewed documented beta blocker date and time   Airway Mallampati: III  TM Distance: >3 FB Neck ROM: Full    Dental no notable dental hx. (+) Teeth Intact, Dental Advisory Given   Pulmonary Current Smoker and Patient abstained from smoking.   Pulmonary exam normal breath sounds clear to auscultation       Cardiovascular hypertension, Pt. on medications and Pt. on home beta blockers  Rhythm:Regular Rate:Normal     Neuro/Psych negative neurological ROS  negative psych ROS   GI/Hepatic negative GI ROS, Neg liver ROS,,,  Endo/Other  negative endocrine ROS    Renal/GU negative Renal ROS  negative genitourinary   Musculoskeletal  (+) Arthritis , Osteoarthritis,    Abdominal   Peds  Hematology negative hematology ROS (+)   Anesthesia Other Findings   Reproductive/Obstetrics negative OB ROS                             Anesthesia Physical Anesthesia Plan  ASA: 2  Anesthesia Plan: General   Post-op Pain Management: Tylenol  PO (pre-op)*   Induction: Intravenous  PONV Risk Score and Plan: 2 and Ondansetron , Dexamethasone , Propofol  infusion, TIVA and Midazolam   Airway Management Planned: Oral ETT  Additional Equipment:   Intra-op Plan:   Post-operative Plan: Extubation in OR  Informed Consent: I have reviewed the patients History and Physical, chart, labs and discussed the procedure including the risks, benefits and alternatives for the proposed anesthesia with the patient or authorized representative who has indicated his/her understanding and acceptance.     Dental advisory given  Plan Discussed with: CRNA  Anesthesia Plan Comments:        Anesthesia Quick Evaluation

## 2024-05-24 NOTE — Anesthesia Postprocedure Evaluation (Signed)
 Anesthesia Post Note  Patient: Angela Lin  Procedure(s) Performed: BRONCHOSCOPY, WITH EBUS (Bilateral) VIDEO BRONCHOSCOPY WITH ENDOBRONCHIAL NAVIGATION (Right) VIDEO BRONCHOSCOPY WITH RADIAL ENDOBRONCHIAL ULTRASOUND BRONCHOSCOPY, WITH NEEDLE ASPIRATION BIOPSY BRONCHOSCOPY, WITH BRUSH BIOPSY BRONCHOSCOPY, WITH BIOPSY IRRIGATION, BRONCHUS     Patient location during evaluation: PACU Anesthesia Type: General Level of consciousness: awake and alert Pain management: pain level controlled Vital Signs Assessment: post-procedure vital signs reviewed and stable Respiratory status: spontaneous breathing, nonlabored ventilation, respiratory function stable and patient connected to nasal cannula oxygen Cardiovascular status: blood pressure returned to baseline and stable Postop Assessment: no apparent nausea or vomiting Anesthetic complications: no   No notable events documented.  Last Vitals:  Vitals:   05/24/24 1710 05/24/24 1720  BP: 117/70 114/70  Pulse: 91 91  Resp: 16 16  Temp:    SpO2: 92% 93%    Last Pain:  Vitals:   05/24/24 1720  TempSrc:   PainSc: 0-No pain   Pain Goal:                   Angela Lin

## 2024-05-24 NOTE — Interval H&P Note (Signed)
 History and Physical Interval Note:  05/24/2024 2:19 PM  Angela Lin  has presented today for surgery, with the diagnosis of mediastinal adenopathy with RUL mass.  The various methods of treatment have been discussed with the patient and family. After consideration of risks, benefits and other options for treatment, the patient has consented to  Procedure(s) with comments: BRONCHOSCOPY, WITH EBUS (Bilateral) BRONCHOSCOPY, WITH BIOPSY USING ELECTROMAGNETIC NAVIGATION (Right) - Nav needed as a surgical intervention.  The patient's history has been reviewed, patient examined, no change in status, stable for surgery.  I have reviewed the patient's chart and labs.  Questions were answered to the patient's satisfaction.     [name]

## 2024-05-24 NOTE — Assessment & Plan Note (Addendum)
 Severe hypercalcemia of malignancy Thought to be secondary to either progressive metastatic breast cancer or right upper lobe mass concerning for primary lung malignancy. Calcium  has now dropped to 12, will discontinue calcitonin and reduce rate of intravenous volume resuscitation Zometa  4 mg given on 5/27 Serial chemistries to monitor calcium  levels closely

## 2024-05-24 NOTE — Transfer of Care (Signed)
 Immediate Anesthesia Transfer of Care Note  Patient: Angela Lin  Procedure(s) Performed: BRONCHOSCOPY, WITH EBUS (Bilateral) VIDEO BRONCHOSCOPY WITH ENDOBRONCHIAL NAVIGATION (Right) VIDEO BRONCHOSCOPY WITH RADIAL ENDOBRONCHIAL ULTRASOUND BRONCHOSCOPY, WITH NEEDLE ASPIRATION BIOPSY BRONCHOSCOPY, WITH BRUSH BIOPSY BRONCHOSCOPY, WITH BIOPSY IRRIGATION, BRONCHUS  Patient Location: Endoscopy Unit  Anesthesia Type:General  Level of Consciousness: awake  Airway & Oxygen Therapy: Patient Spontanous Breathing and Patient connected to face mask oxygen  Post-op Assessment: Report given to RN and Post -op Vital signs reviewed and stable  Post vital signs: Reviewed and stable  Last Vitals:  Vitals Value Taken Time  BP 108/57 05/24/24 1600  Temp    Pulse 84 05/24/24 1601  Resp 20 05/24/24 1601  SpO2 99 % 05/24/24 1601  Vitals shown include unfiled device data.  Last Pain:  Vitals:   05/24/24 1257  TempSrc: Temporal  PainSc: 10-Worst pain ever         Complications: No notable events documented.

## 2024-05-24 NOTE — Assessment & Plan Note (Addendum)
 Continue amlodipine , metoprolol  Blood pressures well-controlled As needed intravenous hydralazine for markedly elevated blood pressures.

## 2024-05-24 NOTE — Assessment & Plan Note (Addendum)
 Patient undergoing bronchoscopy with EBUS today and biopsies  Will review results of biopsies with oncology once available  seen on CT imaging measuring 5.8 x 5.7 cm  concern for primary lung malignancy Dr. Bertrum Brodie with PCCM following daily, their input is appreciated.

## 2024-05-24 NOTE — Assessment & Plan Note (Signed)
 CT imaging revealing fracture of the left superior pubic ramus at the pubic acetabular junction as well as a large destructive mass filling the inferior pubic left pubic ramus measuring 3.5 cm Patient evaluated by orthopedic surgery, supportive care recommended, their input is appreciated.

## 2024-05-24 NOTE — Assessment & Plan Note (Addendum)
 Continued improvement in acute kidney injury, creatinine of 1.73 substantial acute kidney injury secondary to volume depletion and hypercalcemia  Hydrating patient aggressively Strict input and output monitoring Minimizing use of nephrotoxic agents

## 2024-05-24 NOTE — Progress Notes (Signed)
 PROGRESS NOTE   Angela Lin  UEA:[id number] DOB: 1961/04/27 DOA: 05/22/2024 PCP: Helyn Lobstein, MD   Date of Service: the patient was seen and examined on 05/24/2024  Brief Narrative:  63 y.o. 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  Patient follows with Dr. Gudena. Patient had outpatient labs performed revealing severe hypercalcemia patient was instructed to come to the [facility] for evaluation.  Upon evaluation in the emergency department, CT imaging of the chest abdomen and pelvis was performed revealing a right upper lobe mass measuring 5.8 x 5.7 cm in addition to diffuse bony metastatic disease throughout the abdomen and pelvis with a destructive soft tissue mass in the right L1 pedicle causing significant spinal canal and neuroforaminal narrowing.  Case was discussed with Dr. Larrie Po of neurosurgery who recommended oncology and radiation oncology consultations for potential management.  Patient was initiated on intravenous fluids for the severe hypercalcemia and the hospitalist group was then called to assess the patient for admission to the hospital.  Patient was continuing aggressive intravenous volume resuscitation and was additionally provided with zoledronate and calcitonin.  Case was discussed with Dr. Gudena with oncology who would formally evaluate patient in consultation.  Considering the right upper lobe mass, Dr. Bertrum Brodie with PCCM was additionally consulted for recommendations on approach for biopsy.   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 Calcium  has now dropped to 12,  will discontinue calcitonin and reduce rate of intravenous volume resuscitation Zometa 4 mg given on 5/27 Serial chemistries to monitor calcium  levels closely Mass of upper lobe of right lung Patient undergoing bronchoscopy with EBUS today and biopsies  Will review results of biopsies with oncology once available  seen on CT imaging measuring 5.8 x 5.7 cm  concern for primary lung malignancy Dr. Bertrum Brodie with PCCM following daily, their input is appreciated.   AKI (acute kidney injury) (HCC) Continued improvement in acute kidney injury, creatinine of 1.73 substantial acute kidney injury secondary to volume depletion and hypercalcemia  Hydrating patient aggressively Strict input and output monitoring Minimizing use of nephrotoxic agents Pathological fracture, pelvis, initial encounter for fracture CT imaging revealing fracture of the left superior pubic ramus at the pubic acetabular junction as well as a large destructive mass filling the inferior pubic left pubic ramus measuring 3.5 cm Patient evaluated by orthopedic surgery, supportive care recommended, their input is appreciated. 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 Lee Public Patient has been on letrozole  since April 2023, continuing Significant metastatic disease of the spine, oncology to coordinate with radiation oncology for potential radiation therapy Essential hypertension Continue amlodipine , metoprolol  Blood pressures well-controlled As needed intravenous hydralazine for markedly elevated blood pressures. Hypokalemia Persisting  replacing with potassium chloride Evaluating for concurrent hypomagnesemia  Monitoring potassium levels with serial chemistries.     Subjective:  Patient states that tremors have dramatically improved since yesterday.  Patient continues to  complain of generalized weakness and intermittent severe back pain.  Physical Exam:  Vitals:   05/23/24 0947 05/23/24 1106 05/23/24 2054 05/24/24 0353  BP: 139/76 139/76 135/78 129/71  Pulse: 84 84 (!) 102 97  Resp: 19  19 18   Temp: 97.7 F (36.5 C)  98.6 F (37 C) 99.1 F (37.3 C)  TempSrc: Oral  Oral Oral  SpO2: 96%  95% 93%  Weight:      Height:        Constitutional: Patient is l awake, alert and oriented x 3, tremors has resolved.   Skin: no rashes, no lesions, good skin turgor noted. Eyes: Pupils are equally reactive to light.  No evidence of scleral icterus or conjunctival pallor.  ENMT: Moist mucous membranes noted.  Posterior pharynx clear of any exudate or lesions.   Respiratory: clear to auscultation bilaterally, no wheezing, no crackles. Normal respiratory effort. No accessory muscle use.  Cardiovascular: Regular rate and rhythm, no murmurs / rubs / gallops. No extremity edema. 2+ pedal pulses. No carotid bruits.  Abdomen: Abdomen is soft and nontender.  No evidence of intra-abdominal masses.  Positive bowel sounds noted in all quadrants.   Musculoskeletal: No joint deformity upper and lower extremities. Good ROM, no contractures. Normal muscle tone.    Data Reviewed:  I have personally reviewed and interpreted labs, imaging.  Significant findings are   CBC: Recent Labs  Lab 05/22/24 1736 05/23/24 0026 05/24/24 0405  WBC 9.0 6.6 7.0  NEUTROABS 6.6  --  5.9  HGB 12.8 12.6 10.8*  HCT 37.5 38.2 32.6*  MCV 85.2 89.0 88.6  PLT 351 311 275   Basic Metabolic Panel: Recent Labs  Lab 05/22/24 1736 05/23/24 0026 05/23/24 0851 05/23/24 1738 05/24/24 0405  NA 130* 135 135 134* 133*  K 3.1* 3.8 3.7 3.2* 3.1*  CL 90* 101 103 102 104  CO2 29 26 24 24  21*  GLUCOSE 118* 103* 96 96 92  BUN 40* 36* 30* 26* 22  CREATININE 2.74* 2.26* 2.09* 2.14* 1.73*  CALCIUM  >15.0* >15.0* 14.1* 13.1* 12.0*   GFR: Estimated Creatinine Clearance: 30.3 mL/min (A) (by C-G  formula based on SCr of 1.73 mg/dL (H)). Liver Function Tests: Recent Labs  Lab 05/22/24 1736 05/23/24 0026 05/24/24 0405  AST 21 18 17   ALT 27 24 17   ALKPHOS 151* 140* 117  BILITOT 0.8 0.4 0.6  PROT 7.5 6.9 6.0*  ALBUMIN 3.3* 3.0* 2.7*     Code Status:  Full code.  Code status decision has been confirmed with: patient    Severity of Illness:  The appropriate patient status for this patient is INPATIENT. Inpatient status is judged to be reasonable and necessary in order to provide the required intensity of service to ensure the patient's safety. The patient's presenting symptoms, physical exam findings, and initial radiographic and laboratory data in the context of their chronic comorbidities is felt to place them at high risk for further clinical deterioration. Furthermore, it is not anticipated that the patient will be medically stable for discharge from the hospital within 2 midnights of admission.   * I certify that at the point of admission it is my clinical judgment that the patient will require inpatient hospital care spanning beyond 2 midnights from the point of admission due to high intensity of service, high risk for further deterioration and high frequency of surveillance required.*  Time spent:  52 minutes  Author:  True Fuss MD  05/24/2024 9:05 AM

## 2024-05-24 NOTE — Anesthesia Procedure Notes (Signed)
 Procedure Name: Intubation Date/Time: 05/24/2024 2:41 PM  Performed by: Johann Muta, CRNAPre-anesthesia Checklist: Patient identified, Emergency Drugs available, Suction available and Patient being monitored Patient Re-evaluated:Patient Re-evaluated prior to induction Oxygen Delivery Method: Circle System Utilized Preoxygenation: Pre-oxygenation with 100% oxygen Induction Type: IV induction Ventilation: Mask ventilation without difficulty Tube type: Oral Tube size: 8.5 mm Number of attempts: 1 Airway Equipment and Method: Stylet and Oral airway Placement Confirmation: ETT inserted through vocal cords under direct vision, positive ETCO2 and breath sounds checked- equal and bilateral Secured at: 22 cm Tube secured with: Tape Dental Injury: Teeth and Oropharynx as per pre-operative assessment

## 2024-05-24 NOTE — H&P (View-Only) (Signed)
 NAME:  Angela Lin, MRN:  [id number], DOB:  12-Mar-1961, LOS: 2 ADMISSION DATE:  05/22/2024, CONSULTATION DATE:  05/23/24 REFERRING MD:  TRiad MD Dr Alfred Imperial, CHIEF COMPLAINT:  Lung mass, bony meets. Mediastninal adenopathy   History of Present Illness:   63 year old female lives alone. Only blood family is 36 year old mom and brother in town. Has history of breast cancer followed by Dr Lee Public. Has ongoing > 20 pack smoking history.  She is on letrozole  since 2023 and status post lumpectomy and radiation for breast cancer.  She came into the ER with new onset of low back pain for 4 weeks without any urine diarrhea or stool incontinence.  She also had sciatica to the right lower extremity.  Denied any vomiting fever or chills.  Found to have hypercalcemia with a calcium  greater than 15.  Also acute kidney injury with a creatinine of 2.7 mg percent.  Also had worsening metastatic lesion to the spine T7 compression fracture and also spinal canal and neural narrowing in the lumbar area.  There was also a subacute fracture of the left superior rami and destructive lesion of the left inferior rami with metastasis.  She has been started on fluids, zoledronate and calcitonin.  She is pending an MRI.  CT scan of the chest showed Right upper lobe mass measures 5.8 x 5.7 cm, series 3, image 76. This causes bowing of the minor fissure. Centrally this lesion abuts the mediastinal fat with loss of fat plane.  In addition the mediastinal adenopathy [all present visualized].  Pulmonary has been consulted.  Upon walking to the room mother and patient extremely anxious about social situation upon discharge.  Fears of being able to manage illness alone.  They also declined MRI.  Educated about need for MRI and they were agreeable.   Past Medical History:    has a past medical history of Arthritis, Endometriosis, Heart murmur, and Hypertension.   has a past surgical history that includes Diagnostic laparoscopy;  Total hip arthroplasty (Right, 06/04/2016); Total hip arthroplasty (Left, 12/08/2018); Breast biopsy (Left, 03/22/2022); and Breast lumpectomy (Left, 05/26/2022).  Tobacco Use: High Risk (05/22/2024)   Patient History    Smoking Tobacco Use: Every Day    Smokeless Tobacco Use: Never    Passive Exposure: Not on file    Allergies  Allergen Reactions   Hydrochlorothiazide Other (See Comments)    Unknown        Significant Hospital Events:  05/22/2024 - admit 05/23/24 - Pulm consult  -  Interim History / Subjective:   05/24/2024 - Awaits EBUS/Nav by Dr Lucina Sabal at [facility]. NPO status - RN says patient only had sips with meds. Plat and INR normal. Heparin on hold. Called Endo - procedure time 13:45 today. Care link will get patient. RN was updated about this. Patient brother at bedside  Calcium  better at 60 Sleepy this am after being awake last night per RN    Objective       05/24/2024    3:53 AM 05/23/2024    8:54 PM 05/23/2024   11:06 AM  Vitals with BMI  Systolic 129 135 244  Diastolic 71 78 76  Pulse 97 102 84    Today's Vitals   05/23/24 2357 05/24/24 0150 05/24/24 0353 05/24/24 0811  BP:   129/71   Pulse:   97   Resp:   18   Temp:   99.1 F (37.3 C)   TempSrc:   Oral  SpO2:   93%   Weight:      Height:      PainSc: 4  9   Asleep   Body mass index is 24.95 kg/m.   General Appearance:  Looks sleepy Head:  Normocephalic, without obvious abnormality, atraumatic Eyes:  PERRL - yes, conjunctiva/corneas - muddy     Ears:  Normal external ear canals, both ears Nose:  G tube - no Throat:  ETT TUBE - no , OG tube - no Neck:  Supple,  No enlargement/tenderness/nodules Lungs: Clear to auscultation bilaterally,  Heart:  S1 and S2 normal, no murmur, CVP - no.  Pressors - no Abdomen:  Soft, no masses, no organomegaly Genitalia / Rectal:  Not done Extremities:  Extremities- intact Skin:  ntact in exposed areas . Sacral area - x Neurologic:  Sedation -  opiod prn -> RASS - -2 . Moves all 4s - yes. CAM-ICU - x . Orientation - arousable      Assessment & Plan:   PULMONARY  A:  Strong smoking history History of breast cancer status postlumpectomy and radiation on letrozole  since April 2023 Presents with - Right upper lobe mass measures 5.8 x 5.7 cm and mediastinal adenopathy, bony metastasis to the hip and spine along with hypercalcemia   05/24/2024 ->  for EBUS/NVA today  P:   AWAIT  EBUS and navigational bronchoscopy  by Dr Ayesha Lente at [facility]   Risks of pneumothorax, hemothorax, sedation/anesthesia complications such as cardiac or respiratory arrest or hypotension, stroke and bleeding all explained. Benefits of diagnosis but limitations of non-diagnosis also explained. Patient verbalized understanding and wished to proceed. - to berother 05/24/2024   Chagne IV fluids to D5 LR At 100cc/hospital (dc normal saline at 100cc/h)   Hypercalcemia  Plan  - Control according to the hospitalist   Best practice (daily eval):  Diet: NPO after 1am 05/24/24 Pain/Anxiety/Delirium protocol (if indicated): per triad (She is very anxious) VAP protocol (if indicated): x DVT prophylaxis: Stop heparin 5/28 in anticipatin of procedure GI prophylaxis: per triad Glucose control:  per triad Mobility: per triad Code Status: full Family Communication:  5/28 mother and patient 5/29 - Brother at bedside Disposition: med surg [place] per triad   SIGNATURE    Dr. Maire Scot, M.D., F.C.C.P,  Pulmonary and Critical Care Medicine Staff Physician, [facility] Center Director - Interstitial Lung Disease  Program  Pulmonary Fibrosis [facility] at [place], [place], [address]  NPI Number:  NPI #[id number]  Pager: [phone], If no answer  -> Check AMION or Try [phone] Telephone (clinical office): [phone] Telephone (research): [phone]  10:24 AM 05/24/2024   05/24/2024 10:24  AM    LABS    PULMONARY No results for input(s): "PHART", "PCO2ART", "PO2ART", "HCO3", "TCO2", "O2SAT" in the last 168 hours.  Invalid input(s): "PCO2", "PO2"  CBC Recent Labs  Lab 05/22/24 1736 05/23/24 0026 05/24/24 0405  HGB 12.8 12.6 10.8*  HCT 37.5 38.2 32.6*  WBC 9.0 6.6 7.0  PLT 351 311 275    COAGULATION Recent Labs  Lab 05/24/24 0405  INR 1.1    CARDIAC  No results for input(s): "TROPONINI" in the last 168 hours. No results for input(s): "PROBNP" in the last 168 hours.  CHEMISTRY Recent Labs  Lab 05/22/24 1736 05/23/24 0026 05/23/24 0851 05/23/24 1738 05/24/24 0405  NA 130* 135 135 134* 133*  K 3.1* 3.8 3.7 3.2* 3.1*  CL 90* 101  103 102 104  CO2 29 26 24 24  21*  GLUCOSE 118* 103* 96 96 92  BUN 40* 36* 30* 26* 22  CREATININE 2.74* 2.26* 2.09* 2.14* 1.73*  CALCIUM  >15.0* >15.0* 14.1* 13.1* 12.0*   Estimated Creatinine Clearance: 30.3 mL/min (A) (by C-G formula based on SCr of 1.73 mg/dL (H)).   LIVER Recent Labs  Lab 05/22/24 1736 05/23/24 0026 05/24/24 0405  AST 21 18 17   ALT 27 24 17   ALKPHOS 151* 140* 117  BILITOT 0.8 0.4 0.6  PROT 7.5 6.9 6.0*  ALBUMIN 3.3* 3.0* 2.7*  INR  --   --  1.1     INFECTIOUS No results for input(s): "LATICACIDVEN", "PROCALCITON" in the last 168 hours.   ENDOCRINE CBG (last 3)  No results for input(s): "GLUCAP" in the last 72 hours.       IMAGING x48h  - image(s) personally visualized  -   highlighted in bold ECHOCARDIOGRAM COMPLETE Result Date: 05/23/2024    ECHOCARDIOGRAM REPORT   Patient Name:   [facility] SCARLET Yon Date of Exam: 05/23/2024 Medical Rec #:  [id number]              Height:       65.0 in Accession #:    [id number]             Weight:       149.9 lb Date of Birth:  06/30/1961              BSA:          1.750 m Patient Age:    63 years               BP:           139/76 mmHg Patient Gender: F                      HR:           100 bpm. Exam Location:  Inpatient Procedure: 2D Echo,  Cardiac Doppler and Color Doppler (Both Spectral and Color            Flow Doppler were utilized during procedure). Indications:    R06.02 SOB  History:        Patient has no prior history of Echocardiogram examinations.                 Breast cancer.  Sonographer:    Raynelle Callow RDCS Referring Phys: [phone] [facility]  Sonographer Comments: Technically difficult study due to poor echo windows. Patient in pain, supine. IMPRESSIONS  1. Left ventricular ejection fraction, by estimation, is 60 to 65%. The left ventricle has normal function. The left ventricle has no regional wall motion abnormalities. Left ventricular diastolic parameters are consistent with Grade I diastolic dysfunction (impaired relaxation).  2. Right ventricular systolic function is normal. The right ventricular size is normal. Tricuspid regurgitation signal is inadequate for assessing PA pressure. The estimated right ventricular systolic pressure is 9.7 mmHg.  3. The mitral valve is grossly normal. Trivial mitral valve regurgitation. No evidence of mitral stenosis.  4. The aortic valve is grossly normal. Aortic valve regurgitation is not visualized. No aortic stenosis is present.  5. The inferior vena cava is normal in size with greater than 50% respiratory variability, suggesting right atrial pressure of 3 mmHg. FINDINGS  Left Ventricle: Left ventricular ejection fraction, by estimation, is 60 to 65%. The left ventricle has normal function. The left ventricle has no regional  wall motion abnormalities. The left ventricular internal cavity size was normal in size. There is  no left ventricular hypertrophy. Left ventricular diastolic parameters are consistent with Grade I diastolic dysfunction (impaired relaxation). Normal left ventricular filling pressure. Right Ventricle: The right ventricular size is normal. No increase in right ventricular wall thickness. Right ventricular systolic function is normal. Tricuspid regurgitation signal is inadequate  for assessing PA pressure. The tricuspid regurgitant velocity is 1.29 m/s, and with an assumed right atrial pressure of 3 mmHg, the estimated right ventricular systolic pressure is 9.7 mmHg. Left Atrium: Left atrial size was normal in size. Right Atrium: Right atrial size was normal in size. Pericardium: There is no evidence of pericardial effusion. Mitral Valve: The mitral valve is grossly normal. Trivial mitral valve regurgitation. No evidence of mitral valve stenosis. Tricuspid Valve: The tricuspid valve is not well visualized. Tricuspid valve regurgitation is trivial. No evidence of tricuspid stenosis. Aortic Valve: The aortic valve is grossly normal. Aortic valve regurgitation is not visualized. No aortic stenosis is present. Pulmonic Valve: The pulmonic valve was not well visualized. Pulmonic valve regurgitation is not visualized. No evidence of pulmonic stenosis. Aorta: The aortic root and ascending aorta are structurally normal, with no evidence of dilitation. Venous: The inferior vena cava is normal in size with greater than 50% respiratory variability, suggesting right atrial pressure of 3 mmHg. IAS/Shunts: The atrial septum is grossly normal.  LEFT VENTRICLE PLAX 2D LVIDd:         3.90 cm     Diastology LVIDs:         2.70 cm     LV e' medial:    8.49 cm/s LV PW:         0.90 cm     LV E/e' medial:  7.8 LV IVS:        0.90 cm     LV e' lateral:   9.79 cm/s LVOT diam:     2.10 cm     LV E/e' lateral: 6.7 LV SV:         70 LV SV Index:   40 LVOT Area:     3.46 cm  LV Volumes (MOD) LV vol d, MOD A2C: 44.3 ml LV vol d, MOD A4C: 44.0 ml LV vol s, MOD A2C: 15.6 ml LV vol s, MOD A4C: 16.1 ml LV SV MOD A2C:     28.7 ml LV SV MOD A4C:     44.0 ml LV SV MOD BP:      29.3 ml RIGHT VENTRICLE             IVC RV S prime:     17.50 cm/s  IVC diam: 1.40 cm TAPSE (M-mode): 2.8 cm LEFT ATRIUM           Index        RIGHT ATRIUM           Index LA diam:      3.00 cm 1.71 cm/m   RA Area:     11.30 cm LA Vol (A2C): 12.6 ml  7.20 ml/m   RA Volume:   24.00 ml  13.71 ml/m LA Vol (A4C): 31.4 ml 17.94 ml/m  AORTIC VALVE LVOT Vmax:   119.00 cm/s LVOT Vmean:  79.300 cm/s LVOT VTI:    0.202 m  AORTA Ao Root diam: 3.20 cm Ao Asc diam:  2.70 cm MITRAL VALVE                TRICUSPID VALVE MV Area (  PHT): 3.61 cm     TR Peak grad:   6.7 mmHg MV Decel Time: 210 msec     TR Vmax:        129.00 cm/s MV E velocity: 65.80 cm/s MV A velocity: 102.60 cm/s  SHUNTS MV E/A ratio:  0.64         Systemic VTI:  0.20 m                             Systemic Diam: 2.10 cm Maudine Sos MD Electronically signed by Maudine Sos MD Signature Date/Time: 05/23/2024/5:14:59 PM    Final    CT Renal Stone Study Result Date: 05/22/2024 CLINICAL DATA:  R flank pain, recent cancer diagnosis, aki EXAM: CT ABDOMEN AND PELVIS WITHOUT CONTRAST TECHNIQUE: Multidetector CT imaging of the abdomen and pelvis was performed following the standard protocol without IV contrast. RADIATION DOSE REDUCTION: This exam was performed according to the departmental dose-optimization program which includes automated exposure control, adjustment of the mA and/or kV according to patient size and/or use of iterative reconstruction technique. COMPARISON:  None Available. FINDINGS: Of note, the lack of intravenous contrast limits evaluation of the solid organ parenchyma and vascularity. Lower chest: For findings above the diaphragm, please see the separately dictated CT of the chest report, which was performed concurrently. Hepatobiliary: Subcentimeter hypodensity in the right hepatic lobe measuring 5 mm (axial 9). Left hepatic lobe hypodensity measuring 9 mm (axial 9). Segment 4 hypodensity measuring 1.1 cm (axial 17). 1.3 cm hypodensity abutting the gallbladder fossa in the right hepatic lobe inferiorly (axial 27).Focal fatty infiltration along the falciform ligament.Layering biliary sludge or punctate gallstones. No gallbladder wall thickening. No intrahepatic or extrahepatic biliary  ductal dilation. Pancreas: No mass or main ductal dilation. No peripancreatic inflammation or fluid collection. Spleen: Normal size. No mass. Adrenals/Urinary Tract: No adrenal mass. Large cyst in the left upper pole. No hydronephrosis or nephrolithiasis. The urinary bladder is obscured by metallic streak artifact by both hip arthroplasties. Stomach/Bowel:Likely fluid-filled diverticulum arising from the posterior stomach (axial 11). The stomach contains ingested material without focal abnormality. No small bowel wall thickening or inflammation. No small bowel obstruction.Normal appendix. Vascular/Lymphatic: No aortic aneurysm. Diffuse aortoiliac atherosclerosis. No intraabdominal or pelvic lymphadenopathy. Reproductive: The pelvic organs are obscured by metallic streak artifact from both hip arthroplasties. No free pelvic fluid. Other: No pneumoperitoneum, ascites, or mesenteric inflammation. Musculoskeletal: Destructive soft tissue mass in the right pedicle of L1, measuring 3.5 cm, encroaching into the neural foramen and causing significant spinal canal narrowing (axial 23). A smaller destructive mass in the right pedicle of the L4 vertebral body measures 1.7 cm, encroaching into the right neural foramen and the lateral right spinal canal (axial 39). 2.7 cm lytic lesion in the right L2 vertebral body. Both hip arthroplasties are anatomically aligned without dislocation. Age-indeterminate, fracture of the left superior pubic ramus at the puboacetabular junction. Large destructive mass filling the inferior left pubic ramus measuring 3.5 cm. IMPRESSION: 1. Multiple small hypodensities in the liver measuring up to 1.3 cm in the inferior right hepatic lobe (axial 27), worrisome for metastatic disease. 2. Diffuse bony metastatic disease throughout the abdomen and pelvis. For example, there is a destructive soft tissue mass in the right L1 pedicle, measuring 3.5 cm, causing significant spinal canal and neuroforaminal  narrowing (axial 23). 3. Age-indeterminate, but favored to be subacute, fracture of the left superior pubic ramus at the puboacetabular junction. Large destructive mass filling the  inferior left pubic ramus, measuring 3.5 cm. Electronically Signed   By: Rance Burrows M.D.   On: 05/22/2024 19:26   CT CHEST WO CONTRAST Result Date: 05/22/2024 CLINICAL DATA:  Mass of upper lobe of right lung. Abnormal x-ray. History of left-sided breast cancer. Current smoker. EXAM: CT CHEST WITHOUT CONTRAST TECHNIQUE: Multidetector CT imaging of the chest was performed following the standard protocol without IV contrast. RADIATION DOSE REDUCTION: This exam was performed according to the departmental dose-optimization program which includes automated exposure control, adjustment of the mA and/or kV according to patient size and/or use of iterative reconstruction technique. COMPARISON:  Chest radiograph 02/17/2024 FINDINGS: Cardiovascular: The heart is normal in size. There are coronary artery calcifications. No pericardial effusion. Moderate aortic atherosclerosis. No aortic aneurysm. Mediastinum/Nodes: Pathologic mediastinal adenopathy including a 19 mm short axis right paratracheal node series 2, image 54. There additional prominent mediastinal lymph nodes. Hilar assessment is limited in the absence of IV contrast. Unremarkable appearance of the esophagus. Lungs/Pleura: Right upper lobe mass measures 5.8 x 5.7 cm, series 3, image 76. This causes bowing of the minor fissure. Centrally this lesion abuts the mediastinal fat with loss of fat plane. This is increased in size from prior chest radiograph. 3 mm left upper lobe nodule series 3, image 39. Mild emphysema. Mild subpleural reticulation within the dependent lower lobes. Mild retained secretions in the central airways. No pleural effusion or thickening. Upper Abdomen: Posterior gastric diverticulum. No adrenal nodule. 6.3 cm fluid density lesion arising from the lateral left  kidney is likely cyst, although incompletely included in the field of view. The liver appears mildly heterogeneous but no discrete lesion is seen. Musculoskeletal: There are multiple lytic lesions throughout the ribs and spine consistent with osseous metastatic disease. Lytic lesion within T7 has associated mild pathologic compression fracture as well as loss of posterior cortical margin and possible extraosseous soft tissue component. There are lesions throughout multiple thoracic vertebra, posterior elements and many ribs. Fractures of lateral lower left ribs may be in part pathologic, and are likely subacute with surrounding callus formation. The largest rib lesion involves posterior left ninth rib, expansile with soft tissue component. Bilateral scapular lucent lesions with a fracture involving the inferior right scapular body. Surgical clips in the left breast and axilla. IMPRESSION: 1. A 5.8 cm right upper lobe mass, increased in size from prior chest radiograph, highly suspicious for primary lung malignancy. 2. Pathologic mediastinal adenopathy, typical of metastatic disease. 3. 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 There is a mild pathologic compression fracture of T7. Soft tissue component with loss of posterior cortex and possible extraosseous soft tissue component. Recommend thoracic spine MRI to assess spinal canal patency. Fractures of lateral lower left ribs may be in part pathologic, and are likely subacute with surrounding callus formation. There is a fracture involving the inferior right scapular body. 4. Coronary artery calcifications. Aortic Atherosclerosis (ICD10-I70.0) and Emphysema (ICD10-J43.9). These results will be called to the ordering clinician or representative by the Radiologist Assistant, and communication documented in the PACS or [employer]. Electronically Signed   By: Chadwick Colonel M.D.   On: 05/22/2024 15:10

## 2024-05-24 NOTE — Op Note (Signed)
 Video Bronchoscopy with Robotic Assisted Bronchoscopic Navigation   Date of Operation: 05/24/2024   Pre-op Diagnosis: Right Upper Lobe Lung Mass  Post-op Diagnosis: Right upper lobe lung mass  Surgeon: Elis Sauber  Anesthesia: General endotracheal anesthesia  Operation: Flexible video fiberoptic bronchoscopy with robotic assistance and biopsies.  Estimated Blood Loss: Minimal  Complications: None  Indications and History: Angela Lin is a 63 y.o. female with history of Right upper lobe lung mass.  Recommendation made to achieve a tissue diagnosis via robotic assisted navigational bronchoscopy.  The risks, benefits, complications, treatment options and expected outcomes were discussed with the patient.  The possibilities of pneumothorax, pneumonia, reaction to medication, pulmonary aspiration, perforation of a viscus, bleeding, failure to diagnose a condition and creating a complication requiring transfusion or operation were discussed with the patient who freely signed the consent.    Description of Procedure: The patient was seen in the Preoperative Area, was examined and was deemed appropriate to proceed.  The patient was taken to [facility] Endoscopy room 3, identified as Angela Lin and the procedure verified as Flexible Video Fiberoptic Bronchoscopy.  A Time Out was held and the above information confirmed.   Prior to the date of the procedure a high-resolution CT scan of the chest was performed. Utilizing ION software program a virtual tracheobronchial tree was generated to allow the creation of distinct navigation pathways to the patient's parenchymal abnormalities. After being taken to the operating room general anesthesia was initiated and the patient  was orally intubated. The video fiberoptic bronchoscope was introduced via the endotracheal tube and a general inspection was performed which showed normal right and left lung anatomy. Aspiration of the bilateral mainstems  was completed to remove any remaining secretions. Robotic catheter inserted into patient's endotracheal tube.   Target #1 Right upper lobe mass: The distinct navigation pathways prepared prior to this procedure were then utilized to navigate to patient's lesion identified on CT scan. The robotic catheter was secured into place and the vision probe was withdrawn.  Lesion location was approximated using fluoroscopy.  Local registration and targeting was performed using Siem[employer] Cios mobile C-arm three-dimensional imaging and radial EBUS. Under fluoroscopic guidance transbronchial needle brushings, transbronchial needle biopsies, and transbronchial forceps biopsies were performed to be sent for cytology and pathology.  A bronchioalveolar lavage was performed in the Right upper lobe and sent for cytology and microbiology.    EBUS-TBNA was done at station 4R.   At the end of the procedure a general airway inspection was performed and there was no evidence of active bleeding. The bronchoscope was removed.  The patient tolerated the procedure well. There was no significant blood loss and there were no obvious complications. A post-procedural chest x-ray is pending.  Samples Target #1: 1. Transbronchial needle brushings from RUL Mass  2. Transbronchial needle biopsies from RUL Mass 3. Transbronchial forceps biopsies from RUL Mass 4. Bronchoalveolar lavage from RUL  EBUS-TBNA Station 4R.   Plans:  The patient will be discharged from the PACU to home when recovered from anesthesia and after chest x-ray is reviewed. We will review the cytology, pathology  results with the patient when they become available. Patient will be transferred back to [facility].   [name], MD [facility] 05/24/2024 3:59 PM    Right upper lobe mass.

## 2024-05-25 ENCOUNTER — Ambulatory Visit
Admit: 2024-05-25 | Discharge: 2024-05-25 | Disposition: A | Discharge status: Home / Self Care | Attending: Radiation Oncology | Admitting: Radiation Oncology

## 2024-05-25 ENCOUNTER — Encounter (HOSPITAL_COMMUNITY): Payer: Self-pay | Admitting: Pulmonary Disease

## 2024-05-25 ENCOUNTER — Inpatient Hospital Stay (HOSPITAL_COMMUNITY)

## 2024-05-25 DIAGNOSIS — E876 Hypokalemia: Secondary | ICD-10-CM | POA: Diagnosis not present

## 2024-05-25 DIAGNOSIS — C7949 Secondary malignant neoplasm of other parts of nervous system: Secondary | ICD-10-CM | POA: Insufficient documentation

## 2024-05-25 DIAGNOSIS — Z7189 Other specified counseling: Secondary | ICD-10-CM

## 2024-05-25 DIAGNOSIS — N179 Acute kidney failure, unspecified: Secondary | ICD-10-CM | POA: Diagnosis not present

## 2024-05-25 DIAGNOSIS — M8448XA Pathological fracture, other site, initial encounter for fracture: Secondary | ICD-10-CM | POA: Insufficient documentation

## 2024-05-25 DIAGNOSIS — I10 Essential (primary) hypertension: Secondary | ICD-10-CM | POA: Diagnosis not present

## 2024-05-25 DIAGNOSIS — C50412 Malignant neoplasm of upper-outer quadrant of left female breast: Secondary | ICD-10-CM

## 2024-05-25 LAB — CBC WITH DIFFERENTIAL/PLATELET
Abs Immature Granulocytes: 0.13 10*3/uL — ABNORMAL HIGH (ref 0.00–0.07)
Basophils Absolute: 0 10*3/uL (ref 0.0–0.1)
Basophils Relative: 0 %
Eosinophils Absolute: 0 10*3/uL (ref 0.0–0.5)
Eosinophils Relative: 0 %
HCT: 32.1 % — ABNORMAL LOW (ref 36.0–46.0)
Hemoglobin: 10.8 g/dL — ABNORMAL LOW (ref 12.0–15.0)
Immature Granulocytes: 1 %
Lymphocytes Relative: 4 %
Lymphs Abs: 0.4 10*3/uL — ABNORMAL LOW (ref 0.7–4.0)
MCH: 29.1 pg (ref 26.0–34.0)
MCHC: 33.6 g/dL (ref 30.0–36.0)
MCV: 86.5 fL (ref 80.0–100.0)
Monocytes Absolute: 0.3 10*3/uL (ref 0.1–1.0)
Monocytes Relative: 2 %
Neutro Abs: 9.5 10*3/uL — ABNORMAL HIGH (ref 1.7–7.7)
Neutrophils Relative %: 93 %
Platelets: 261 10*3/uL (ref 150–400)
RBC: 3.71 MIL/uL — ABNORMAL LOW (ref 3.87–5.11)
RDW: 12.3 % (ref 11.5–15.5)
WBC: 10.3 10*3/uL (ref 4.0–10.5)
nRBC: 0 % (ref 0.0–0.2)

## 2024-05-25 LAB — COMPREHENSIVE METABOLIC PANEL WITH GFR
ALT: 19 U/L (ref 0–44)
AST: 17 U/L (ref 15–41)
Albumin: 2.5 g/dL — ABNORMAL LOW (ref 3.5–5.0)
Alkaline Phosphatase: 122 U/L (ref 38–126)
Anion gap: 8 (ref 5–15)
BUN: 23 mg/dL (ref 8–23)
CO2: 21 mmol/L — ABNORMAL LOW (ref 22–32)
Calcium: 11 mg/dL — ABNORMAL HIGH (ref 8.9–10.3)
Chloride: 106 mmol/L (ref 98–111)
Creatinine, Ser: 1.82 mg/dL — ABNORMAL HIGH (ref 0.44–1.00)
GFR, Estimated: 31 mL/min — ABNORMAL LOW (ref >60)
Glucose, Bld: 119 mg/dL — ABNORMAL HIGH (ref 70–99)
Potassium: 3.5 mmol/L (ref 3.5–5.1)
Sodium: 135 mmol/L (ref 135–145)
Total Bilirubin: 0.6 mg/dL (ref 0.0–1.2)
Total Protein: 6.3 g/dL — ABNORMAL LOW (ref 6.5–8.1)

## 2024-05-25 LAB — MAGNESIUM: Magnesium: 1.2 mg/dL — ABNORMAL LOW (ref 1.7–2.4)

## 2024-05-25 MED ORDER — DEXAMETHASONE 4 MG PO TABS
2.0000 mg | ORAL_TABLET | Freq: Two times a day (BID) | ORAL | Status: DC
  Administered 2024-05-25 – 2024-06-09 (×31): 2 mg via ORAL
  Filled 2024-05-25 (×31): qty 1

## 2024-05-25 MED ORDER — NYSTATIN 100000 UNIT/ML MT SUSP
5.0000 mL | Freq: Four times a day (QID) | OROMUCOSAL | Status: AC
  Administered 2024-05-25 – 2024-05-26 (×6): 500000 [IU] via ORAL
  Filled 2024-05-25 (×6): qty 5

## 2024-05-25 MED ORDER — POTASSIUM CHLORIDE IN NACL 20-0.9 MEQ/L-% IV SOLN
INTRAVENOUS | Status: AC
  Filled 2024-05-25: qty 1000

## 2024-05-25 MED ORDER — ZOLEDRONIC ACID 4 MG/100ML IV SOLN: 4.0000 mg | Freq: Once | INTRAVENOUS | Status: DC

## 2024-05-25 MED ORDER — MAGNESIUM SULFATE 4 GM/100ML IV SOLN
4.0000 g | Freq: Once | INTRAVENOUS | Status: AC
  Administered 2024-05-25: 4 g via INTRAVENOUS
  Filled 2024-05-25: qty 100

## 2024-05-25 NOTE — TOC CM/SW Note (Signed)
 Transition of Care [facility]) - Inpatient Brief Assessment   Patient Details  Name: Angela Lin MRN: [id number] Date of Birth: 1961/03/31  Transition of Care [facility]) CM/SW Contact:    Gertha Ku, LCSW Phone Number: 05/25/2024, 10:22 AM   Clinical Narrative:  Transition of Care Department [facility]) has reviewed patient and no TOC needs have been identified at this time. We will continue to monitor patient advancement through interdisciplinary progression rounds. If new patient transition needs arise, please place a TOC consult.   Transition of Care Asessment: Insurance and Status: Insurance coverage has been reviewed Patient has primary care physician: Yes Home environment has been reviewed: home with self Prior level of function:: mod independent Prior/Current Home Services: No current home services Social Drivers of Health Review: SDOH reviewed no interventions necessary Readmission risk has been reviewed: Yes Transition of care needs: no transition of care needs at this time

## 2024-05-25 NOTE — Progress Notes (Addendum)
 PROGRESS NOTE   Angela Lin  ZOX:[id number] DOB: 01-14-1961 DOA: 05/22/2024 PCP: Helyn Lobstein, MD   Date of Service: the patient was seen and examined on 05/25/2024  Brief Narrative:  63 y.o. 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  Patient follows with Dr. Gudena. Patient had outpatient labs performed revealing severe hypercalcemia patient was instructed to come to the [facility] Emergency Department for evaluation.  Upon evaluation in the emergency department, CT imaging of the chest abdomen and pelvis was performed revealing a right upper lobe mass measuring 5.8 x 5.7 cm in addition to diffuse bony metastatic disease throughout the abdomen and pelvis with a destructive soft tissue mass in the right L1 pedicle causing significant spinal canal and neuroforaminal narrowing.  Case was discussed with Dr. Larrie Po of neurosurgery who recommended oncology and radiation oncology consultations for potential management.  Patient was initiated on intravenous fluids for the severe hypercalcemia and the hospitalist group was then called to assess the patient for admission to the hospital.  Patient was continuing aggressive intravenous volume resuscitation and was additionally provided with zoledronate and calcitonin.  Case was discussed with Dr. Gudena with oncology who would formally evaluate patient in consultation.  Considering the right upper lobe mass, Dr. Bertrum Brodie with PCCM was additionally consulted for recommendations on approach for biopsy.   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 Calcium  has now dropped to  11 Calcitonin was discontinued on 5/29 Zometa 4 mg given on 5/27 Continue intravenous volume resuscitation with normal saline Serial chemistries to monitor calcium  levels closely Mass of upper lobe of right lung Patient underwent bronchoscopy with EBUS 5/29 with biopsies   Will review results of biopsies with oncology once available  seen on CT imaging measuring 5.8 x 5.7 cm  concern for primary lung malignancy Dr. Bertrum Brodie with PCCM following daily, their input is appreciated.   AKI (acute kidney injury) (HCC) Creatinine 1.82 substantial acute kidney injury secondary to volume depletion and hypercalcemia  Continue intravenous hydration Strict input and output monitoring Minimizing use of nephrotoxic agents Pathologic fracture of lumbar vertebra, initial encounter Patient continuing to have substantial back pain due to lytic lesions of the spine resulting in destructive mass identified in the right pedicle of L1, on the right pedicle of the L4 vertebral body as well as the L2 vertebral body Initiating low-dose dexamethasone  today to assist with inflammation and discomfort Opiate-based analgesics for pain control Offered IR consultation for possible kyphoplasty, patient has declined Case discussed with Dr. Lorri Rota with radiation oncology who will evaluate patient in consultation for potential radiation therapy for symptomatic relief Pathological fracture, pelvis, initial encounter for fracture CT imaging revealing fracture of the left superior pubic ramus at the pubic acetabular junction as well as a large destructive mass filling the inferior pubic left pubic ramus measuring 3.5 cm Patient evaluated by orthopedic surgery, supportive care recommended, their input is appreciated. 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 Lee Public Patient  has been on letrozole  since April 2023, continuing Significant metastatic disease of the spine and pelvis, oncology to coordinate with radiation oncology for potential radiation therapy Essential  hypertension Continue amlodipine , metoprolol  Blood pressures well-controlled As needed intravenous hydralazine for markedly elevated blood pressures. Hypokalemia Replaced  Goals of care, counseling/discussion  After lengthy discussion with patient and family concerning patient's guarded prognosis patient wishes to be DNR/DNI at this time Patient wishes to have a conversation with the palliative care team surrounding possible options for hospice and end-of-life care depending on her disease course and treatment options  Subjective:  Patient still complaining of back pain, 6 out of 10 in intensity, sharp in quality, worse with standing or attempted ambulation.  Patient denies any associated incontinence.  Physical Exam:  Vitals:   05/24/24 1720 05/24/24 1758 05/24/24 2021 05/25/24 0440  BP: 114/70 122/67 119/65 (!) 120/59  Pulse: 91 85 92 97  Resp: 16 16 20 16   Temp:  98.3 F (36.8 C) 98.4 F (36.9 C) 98.1 F (36.7 C)  TempSrc:  Oral Oral Oral  SpO2: 93% 95% 96% 95%  Weight:      Height:        Constitutional: Lethargic but arousable and oriented x3, no associated distress.   Skin: no rashes, no lesions, good skin turgor noted. Eyes: Pupils are equally reactive to light.  No evidence of scleral icterus or conjunctival pallor.  ENMT: Moist mucous membranes noted.  Posterior pharynx clear of any exudate or lesions.   Respiratory: clear to auscultation bilaterally, no wheezing, no crackles. Normal respiratory effort. No accessory muscle use.  Cardiovascular: Regular rate and rhythm, no murmurs / rubs / gallops. No extremity edema. 2+ pedal pulses. No carotid bruits.  Abdomen: Abdomen is soft and nontender.  No evidence of intra-abdominal masses.  Positive bowel sounds noted in all  quadrants.   Musculoskeletal: No joint deformity upper and lower extremities. Good ROM, no contractures. Normal muscle tone.    Data Reviewed:  I have personally reviewed and interpreted labs, imaging.  Significant findings are   CBC: Recent Labs  Lab 05/22/24 1736 05/23/24 0026 05/24/24 0405 05/25/24 0359  WBC 9.0 6.6 7.0 10.3  NEUTROABS 6.6  --  5.9 9.5*  HGB 12.8 12.6 10.8* 10.8*  HCT 37.5 38.2 32.6* 32.1*  MCV 85.2 89.0 88.6 86.5  PLT 351 311 275 261   Basic Metabolic Panel: Recent Labs  Lab 05/23/24 0026 05/23/24 0851 05/23/24 1738 05/24/24 0405 05/25/24 0359  NA 135 135 134* 133* 135  K 3.8 3.7 3.2* 3.1* 3.5  CL 101 103 102 104 106  CO2 26 24 24  21* 21*  GLUCOSE 103* 96 96 92 119*  BUN 36* 30* 26* 22 23  CREATININE 2.26* 2.09* 2.14* 1.73* 1.82*  CALCIUM  >15.0* 14.1* 13.1* 12.0* 11.0*  MG  --   --   --   --  1.2*   GFR: Estimated Creatinine Clearance: 28.8 mL/min (A) (by C-G formula based on SCr of 1.82 mg/dL (H)). Liver Function Tests: Recent Labs  Lab 05/22/24 1736 05/23/24 0026 05/24/24 0405 05/25/24 0359  AST 21 18 17 17   ALT 27 24 17 19   ALKPHOS 151* 140* 117 122  BILITOT 0.8 0.4 0.6 0.6  PROT 7.5 6.9 6.0* 6.3*  ALBUMIN 3.3* 3.0* 2.7* 2.5*    Coagulation Profile: Recent Labs  Lab 05/24/24 0405  INR 1.1      Code Status:  DNR.  Code status decision has been confirmed with: patient Family Communication: Mother is at the bedside and has been updated on plan of care.   Severity of Illness:  The appropriate patient status for this patient is INPATIENT. Inpatient  status is judged to be reasonable and necessary in order to provide the required intensity of service to ensure the patient's safety. The patient's presenting symptoms, physical exam findings, and initial radiographic and laboratory data in the context of their chronic comorbidities is felt to place them at high risk for further clinical deterioration. Furthermore, it is not  anticipated that the patient will be medically stable for discharge from the hospital within 2 midnights of admission.   * I certify that at the point of admission it is my clinical judgment that the patient will require inpatient hospital care spanning beyond 2 midnights from the point of admission due to high intensity of service, high risk for further deterioration and high frequency of surveillance required.*  Time spent:  59 minutes  Author:  True Fuss MD  05/25/2024 10:41 AM

## 2024-05-25 NOTE — Assessment & Plan Note (Deleted)
 Continue amlodipine , metoprolol  Blood pressures well-controlled As needed intravenous hydralazine for markedly elevated blood pressures.

## 2024-05-25 NOTE — Plan of Care (Signed)
  Problem: Clinical Measurements: Goal: Ability to maintain clinical measurements within normal limits will improve Outcome: Progressing Goal: Diagnostic test results will improve Outcome: Progressing Goal: Respiratory complications will improve Outcome: Progressing Goal: Cardiovascular complication will be avoided Outcome: Progressing   Problem: Coping: Goal: Level of anxiety will decrease Outcome: Progressing   Problem: Safety: Goal: Ability to remain free from injury will improve Outcome: Progressing

## 2024-05-25 NOTE — Assessment & Plan Note (Addendum)
 Patient underwent bronchoscopy with EBUS 5/29 with biopsies   Will review results of biopsies with oncology once available  seen on CT imaging measuring 5.8 x 5.7 cm  concern for primary lung malignancy Dr. Bertrum Brodie with PCCM following daily, their input is appreciated.

## 2024-05-25 NOTE — Assessment & Plan Note (Addendum)
 Patient continuing to have substantial back pain due to lytic lesions of the spine resulting in destructive mass identified in the right pedicle of L1, on the right pedicle of the L4 vertebral body as well as the L2 vertebral body Initiating low-dose dexamethasone  today to assist with inflammation and discomfort Opiate-based analgesics for pain control Offered IR consultation for possible kyphoplasty, patient has declined Case discussed with Dr. Lorri Rota with radiation oncology who will evaluate patient in consultation for potential radiation therapy for symptomatic relief

## 2024-05-25 NOTE — Assessment & Plan Note (Deleted)
 Persisting  replacing with potassium chloride Evaluating for concurrent hypomagnesemia  Monitoring potassium levels with serial chemistries.

## 2024-05-25 NOTE — Assessment & Plan Note (Deleted)
 Severe hypercalcemia of malignancy Thought to be secondary to either progressive metastatic breast cancer or right upper lobe mass concerning for primary lung malignancy. Calcium  has now dropped to 12, will discontinue calcitonin and reduce rate of intravenous volume resuscitation Zometa  4 mg given on 5/27 Serial chemistries to monitor calcium  levels closely

## 2024-05-25 NOTE — Assessment & Plan Note (Signed)
 CT imaging revealing fracture of the left superior pubic ramus at the pubic acetabular junction as well as a large destructive mass filling the inferior pubic left pubic ramus measuring 3.5 cm Patient evaluated by orthopedic surgery, supportive care recommended, their input is appreciated.

## 2024-05-25 NOTE — Assessment & Plan Note (Addendum)
 Severe hypercalcemia of malignancy Thought to be secondary to either progressive metastatic breast cancer or right upper lobe mass concerning for primary lung malignancy. Calcium  has now dropped to 11 Calcitonin was discontinued on 5/29 Zometa 4 mg given on 5/27 Continue intravenous volume resuscitation with normal saline Serial chemistries to monitor calcium  levels closely

## 2024-05-25 NOTE — Progress Notes (Signed)
 Radiation Oncology         [phone]) [phone] ________________________________  Name: Angela Lin        MRN: [id number]  Date of Service: 05/25/2024 DOB: 05/22/1961  WU:[id number], Jenette Mitchell, MD  Helyn Lobstein, MD     REFERRING PHYSICIAN: Helyn Lobstein, MD   DIAGNOSIS: The primary encounter diagnosis was Malignant neoplasm of upper-outer quadrant of left breast in female, estrogen receptor positive (HCC). A diagnosis of Metastasis of neoplasm to spinal canal [facility]) was also pertinent to this visit.   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 She is status post lumpectomy, adjuvant radiation therapy, and antiestrogen therapy with anastrozole which began in August 2023. She is being seen at the request of Dr. Alphonzo Jenkins for newly diagnosed bony metastases.   Most recently, she presented to the ER on 05/22/2024 after having outpatient labs performed revealing severe hypercalcemia. CT imaging of the chest abdomen and pelvis was performed showing 5.8 cm RUL mass increased in size, highly suspicious for lung malignancy; multifocal bone mets throughout the ribs, spine and scapula; a pathologic fracture of T7, lateral lower left ribs, inferior right scapula; and Destructive soft tissue mass in the right pedicle of L1, measuring 3.5 cm, encroaching into the neural foramen and causing significant spinal canal narrowing.  Patient was admitted for further workup.  Patient was started on IV Zometa  and calcitonin for her hypercalcemia. Neurosurgery was consulted for spinal lesions, no role for surgical intervention was determined.  Patient underwent biopsy of the right upper lung 05/24/2024.  Results are  pending at this time.  She met with Dr. Gudena on 05/23/2024.  He kindly consulted us  to discuss radiation treatment options.    PREVIOUS RADIATION THERAPY: Yes   Radiation Treatment Dates: 07/15/2022 through 08/11/2022 Site Technique Total Dose (Gy) Dose per Fx (Gy) Completed Fx Beam Energies  Breast, Left: Breast_L 3D 42.56/42.56 2.66 16/16 10XFFF  Breast, Left: Breast_L_Bst 3D 8/8 2 4/4 6X, 10X     PAST MEDICAL HISTORY:  Past Medical History:  Diagnosis Date   Arthritis    osteoarthritis- hip   Endometriosis    Heart murmur    as a child, reports as benign    Hypertension        PAST SURGICAL HISTORY: Past Surgical History:  Procedure Laterality Date   BREAST BIOPSY Left 03/22/2022   lt 12:00 venus clip path pending   BREAST LUMPECTOMY Left 05/26/2022   Procedure: BREAST LUMPECTOMY WITH EXCISION OF SENTINEL NODE;  Surgeon: Caralyn Chandler, MD;  Location: [facility];  Service: General;  Laterality: Left;  GEN & PEC BLOCK   BRONCHIAL BIOPSY  05/24/2024   Procedure: BRONCHOSCOPY, WITH BIOPSY;  Surgeon: [name], MD;  Location: [facility];  Service: Pulmonary;;   BRONCHIAL BRUSHINGS  05/24/2024   Procedure: BRONCHOSCOPY, WITH BRUSH BIOPSY;  Surgeon: [name], MD;  Location: [facility];  Service: Pulmonary;;   BRONCHIAL NEEDLE ASPIRATION BIOPSY  05/24/2024   Procedure: BRONCHOSCOPY, WITH NEEDLE ASPIRATION BIOPSY;  Surgeon: [name], MD;  Location: [facility];  Service: Pulmonary;;   BRONCHIAL WASHINGS  05/24/2024   Procedure: IRRIGATION, BRONCHUS;  Surgeon: [name], MD;  Location: [facility];  Service: Pulmonary;;   DIAGNOSTIC LAPAROSCOPY  endometriosis- 20 yrs ago   TOTAL HIP ARTHROPLASTY Right 06/04/2016   Procedure: RIGHT TOTAL HIP ARTHROPLASTY ANTERIOR APPROACH;  Surgeon: Arnie Lao, MD;  Location: [facility];  Service: Orthopedics;  Laterality: Right;   TOTAL HIP ARTHROPLASTY Left 12/08/2018    Procedure: LEFT TOTAL HIP ARTHROPLASTY ANTERIOR APPROACH;  Surgeon: Arnie Lao, MD;  Location: [facility];  Service: Orthopedics;  Laterality: Left;   VIDEO BRONCHOSCOPY WITH ENDOBRONCHIAL NAVIGATION Right 05/24/2024   Procedure: VIDEO BRONCHOSCOPY WITH ENDOBRONCHIAL NAVIGATION;  Surgeon: [name], MD;  Location: [facility];  Service: Pulmonary;  Laterality: Right;  Nav needed   VIDEO BRONCHOSCOPY WITH ENDOBRONCHIAL ULTRASOUND Bilateral 05/24/2024   Procedure: BRONCHOSCOPY, WITH EBUS;  Surgeon: [name], MD;  Location: [facility];  Service: Pulmonary;  Laterality: Bilateral;   VIDEO BRONCHOSCOPY WITH RADIAL ENDOBRONCHIAL ULTRASOUND  05/24/2024   Procedure: VIDEO BRONCHOSCOPY WITH RADIAL ENDOBRONCHIAL ULTRASOUND;  Surgeon: [name], MD;  Location: [facility];  Service: Pulmonary;;     FAMILY HISTORY:  Family History  Problem Relation Age of Onset   Cancer Father        Lung cancer- passed 2005    Hypertension Brother      SOCIAL HISTORY:  reports that she has been smoking cigarettes. She has a 20 pack-year smoking history. She has never used smokeless tobacco. She reports current alcohol use. She reports that she does not use drugs.   ALLERGIES: Hydrochlorothiazide   MEDICATIONS:  No current facility-administered medications for this encounter.   No current outpatient medications on file.   Facility-Administered Medications Ordered in Other Encounters  Medication Dose Route Frequency Provider Last Rate Last Admin   0.9 % NaCl with KCl 20 mEq/ L  infusion   Intravenous Continuous Shalhoub, Merrill Abide, MD 100 mL/hr at 05/25/24 1218 Restarted at 05/25/24 1218   amLODipine  (NORVASC ) tablet 5 mg  5 mg Oral Daily Kakrakandy, Arshad N, MD   5 mg at 05/25/24 1004   atorvastatin  (LIPITOR) tablet 10 mg  10 mg Oral Daily Shalhoub, George J, MD   10 mg at 05/25/24 1008   dexamethasone  (DECADRON ) tablet 2 mg  2 mg Oral BID Shalhoub, George J, MD   2  mg at 05/25/24 1220   feeding supplement (ENSURE PLUS HIGH PROTEIN) liquid 237 mL  237 mL Oral BID BM Shalhoub, George J, MD   237 mL at 05/25/24 1013   hydrALAZINE (APRESOLINE) injection 10 mg  10 mg Intravenous Q6H PRN Shalhoub, George J, MD       oxyCODONE  (Oxy IR/ROXICODONE ) immediate release tablet 5 mg  5 mg Oral Q4H PRN Shalhoub, George J, MD   5 mg at 05/25/24 1005   Or   HYDROmorphone  (DILAUDID ) injection 0.5 mg  0.5 mg Intravenous Q4H PRN Shalhoub, George J, MD       letrozole  (FEMARA ) tablet 2.5 mg  2.5 mg Oral Daily [name], MD   2.5 mg at 05/25/24 1004   magnesium sulfate IVPB 4 g 100 mL  4 g Intravenous Once Shalhoub, George J, MD 50 mL/hr at 05/25/24 1220 4 g at 05/25/24 1220   metoprolol  succinate (TOPROL -XL) 24 hr tablet 150 mg  150 mg Oral Daily Kakrakandy, Arshad N, MD   150 mg at 05/25/24 1004   nystatin (MYCOSTATIN) 100000 UNIT/ML suspension 500,000 Units  5 mL Oral QID Shalhoub, Merrill Abide, MD         REVIEW OF SYSTEMS: On review of systems, the patient reports mid-right sided back pain that has  been present for the past couple weeks and fatigue. She feels as though her pain is being controlled well since being admitted. She denies any spinal, low back, or pain anywhere else. She denies any lower extremity numbness, tingling, or weakness.     PHYSICAL EXAM:  Wt Readings from Last 3 Encounters:  05/22/24 149 lb 14.6 oz (68 kg)  05/16/23 176 lb 1.6 oz (79.9 kg)  01/17/23 171 lb 4 oz (77.7 kg)   Temp Readings from Last 3 Encounters:  05/25/24 98.1 F (36.7 C) (Oral)  02/23/24 98.4 F (36.9 C) (Oral)  05/16/23 (!) 97.5 F (36.4 C) (Temporal)   BP Readings from Last 3 Encounters:  05/25/24 (!) 120/59  02/23/24 132/73  05/16/23 (!) 137/55   Pulse Readings from Last 3 Encounters:  05/25/24 97  02/23/24 82  05/16/23 78    /10  In general this is a fatigued appearing female in no acute distress. She's alert and oriented x4 and appropriate throughout  the examination. Cardiopulmonary assessment is negative for acute distress and she exhibits normal effort. No tenderness upon palpation to the spine. Tenderness ilicited with palpation to the lower right rib cage. Dorsiflexion and plantar flexion intact. 5/5 bilateral lower extremity strength. Sensation grossly intact in the bilateral lower extremities.     ECOG = 2  0 - Asymptomatic (Fully active, able to carry on all predisease activities without restriction)  1 - Symptomatic but completely ambulatory (Restricted in physically strenuous activity but ambulatory and able to carry out work of a light or sedentary nature. For example, light housework, office work)  2 - Symptomatic, <50% in bed during the day (Ambulatory and capable of all self care but unable to carry out any work activities. Up and about more than 50% of waking hours)  3 - Symptomatic, >50% in bed, but not bedbound (Capable of only limited self-care, confined to bed or chair 50% or more of waking hours)  4 - Bedbound (Completely disabled. Cannot carry on any self-care. Totally confined to bed or chair)  5 - Death   Aurea Blossom MM, Creech RH, Tormey DC, et al. [phone]). "Toxicity and response criteria of the [facility]". Am. Hillard Lowes. Oncol. 5 (6): 649-55    LABORATORY DATA:  Lab Results  Component Value Date   WBC 10.3 05/25/2024   HGB 10.8 (L) 05/25/2024   HCT 32.1 (L) 05/25/2024   MCV 86.5 05/25/2024   PLT 261 05/25/2024   Lab Results  Component Value Date   NA 135 05/25/2024   K 3.5 05/25/2024   CL 106 05/25/2024   CO2 21 (L) 05/25/2024   Lab Results  Component Value Date   ALT 19 05/25/2024   AST 17 05/25/2024   ALKPHOS 122 05/25/2024   BILITOT 0.6 05/25/2024      RADIOGRAPHY: DG Chest Port 1 View Result Date: 05/24/2024 CLINICAL DATA:  Status post bronchoscopy EXAM: PORTABLE CHEST 1 VIEW COMPARISON:  Chest CT 05/22/2024 FINDINGS: Large mass again noted in the right upper lobe  measuring approximately 5.6 cm. Probable adjacent postobstructive atelectasis or pneumonia in the right upper lobe. No pneumothorax following bronchoscopy. Left lung clear. Heart is normal size. Soft tissue prominence in the right paratracheal region likely corresponding to previously seen right paratracheal adenopathy. No effusions. IMPRESSION: Right upper lobe mass. Adjacent airspace disease could reflect postobstructive atelectasis or pneumonia. No pneumothorax following bronchoscopy. Electronically Signed   By: Janeece Mechanic M.D.   On: 05/24/2024 18:18   DG C-ARM BRONCHOSCOPY  Result Date: 05/24/2024 C-ARM BRONCHOSCOPY: Fluoroscopy was utilized by the requesting physician.  No radiographic interpretation.   ECHOCARDIOGRAM COMPLETE Result Date: 05/23/2024    ECHOCARDIOGRAM REPORT   Patient Name:   [facility] SC[facility]LET [facility] Date of Exam: 05/23/2024 Medical Rec #:  [id number]              Height:       65.0 in Accession #:    [id number]             Weight:       149.9 lb Date of Birth:  02/19/1961              BSA:          1.750 m Patient Age:    63 years               BP:           139/76 mmHg Patient Gender: F                      HR:           100 bpm. Exam Location:  Inpatient Procedure: 2D Echo, Cardiac Doppler and Color Doppler (Both Spectral and Color            Flow Doppler were utilized during procedure). Indications:    R06.02 SOB  History:        Patient has no prior history of Echocardiogram examinations.                 Breast cancer.  Sonographer:    Raynelle Callow RDCS Referring Phys: [phone] [facility]  Sonographer Comments: Technically difficult study due to poor echo windows. Patient in pain, supine. IMPRESSIONS  1. Left ventricular ejection fraction, by estimation, is 60 to 65%. The left ventricle has normal function. The left ventricle has no regional wall motion abnormalities. Left ventricular diastolic parameters are consistent with Grade I diastolic dysfunction (impaired relaxation).  2. Right  ventricular systolic function is normal. The right ventricular size is normal. Tricuspid regurgitation signal is inadequate for assessing PA pressure. The estimated right ventricular systolic pressure is 9.7 mmHg.  3. The mitral valve is grossly normal. Trivial mitral valve regurgitation. No evidence of mitral stenosis.  4. The aortic valve is grossly normal. Aortic valve regurgitation is not visualized. No aortic stenosis is present.  5. The inferior vena cava is normal in size with greater than 50% respiratory variability, suggesting right atrial pressure of 3 mmHg. FINDINGS  Left Ventricle: Left ventricular ejection fraction, by estimation, is 60 to 65%. The left ventricle has normal function. The left ventricle has no regional wall motion abnormalities. The left ventricular internal cavity size was normal in size. There is  no left ventricular hypertrophy. Left ventricular diastolic parameters are consistent with Grade I diastolic dysfunction (impaired relaxation). Normal left ventricular filling pressure. Right Ventricle: The right ventricular size is normal. No increase in right ventricular wall thickness. Right ventricular systolic function is normal. Tricuspid regurgitation signal is inadequate for assessing PA pressure. The tricuspid regurgitant velocity is 1.29 m/s, and with an assumed right atrial pressure of 3 mmHg, the estimated right ventricular systolic pressure is 9.7 mmHg. Left Atrium: Left atrial size was normal in size. Right Atrium: Right atrial size was normal in size. Pericardium: There is no evidence of pericardial effusion. Mitral Valve: The mitral valve is grossly normal. Trivial mitral valve regurgitation. No evidence of mitral valve stenosis. Tricuspid Valve: The tricuspid valve  is not well visualized. Tricuspid valve regurgitation is trivial. No evidence of tricuspid stenosis. Aortic Valve: The aortic valve is grossly normal. Aortic valve regurgitation is not visualized. No aortic  stenosis is present. Pulmonic Valve: The pulmonic valve was not well visualized. Pulmonic valve regurgitation is not visualized. No evidence of pulmonic stenosis. Aorta: The aortic root and ascending aorta are structurally normal, with no evidence of dilitation. Venous: The inferior vena cava is normal in size with greater than 50% respiratory variability, suggesting right atrial pressure of 3 mmHg. IAS/Shunts: The atrial septum is grossly normal.  LEFT VENTRICLE PLAX 2D LVIDd:         3.90 cm     Diastology LVIDs:         2.70 cm     LV e' medial:    8.49 cm/s LV PW:         0.90 cm     LV E/e' medial:  7.8 LV IVS:        0.90 cm     LV e' lateral:   9.79 cm/s LVOT diam:     2.10 cm     LV E/e' lateral: 6.7 LV SV:         70 LV SV Index:   40 LVOT Area:     3.46 cm  LV Volumes (MOD) LV vol d, MOD A2C: 44.3 ml LV vol d, MOD A4C: 44.0 ml LV vol s, MOD A2C: 15.6 ml LV vol s, MOD A4C: 16.1 ml LV SV MOD A2C:     28.7 ml LV SV MOD A4C:     44.0 ml LV SV MOD BP:      29.3 ml RIGHT VENTRICLE             IVC RV S prime:     17.50 cm/s  IVC diam: 1.40 cm TAPSE (M-mode): 2.8 cm LEFT ATRIUM           Index        RIGHT ATRIUM           Index LA diam:      3.00 cm 1.71 cm/m   RA Area:     11.30 cm LA Vol (A2C): 12.6 ml 7.20 ml/m   RA Volume:   24.00 ml  13.71 ml/m LA Vol (A4C): 31.4 ml 17.94 ml/m  AORTIC VALVE LVOT Vmax:   119.00 cm/s LVOT Vmean:  79.300 cm/s LVOT VTI:    0.202 m  AORTA Ao Root diam: 3.20 cm Ao Asc diam:  2.70 cm MITRAL VALVE                TRICUSPID VALVE MV Area (PHT): 3.61 cm     TR Peak grad:   6.7 mmHg MV Decel Time: 210 msec     TR Vmax:        129.00 cm/s MV E velocity: 65.80 cm/s MV A velocity: 102.60 cm/s  SHUNTS MV E/A ratio:  0.64         Systemic VTI:  0.20 m                             Systemic Diam: 2.10 cm Maudine Sos MD Electronically signed by Maudine Sos MD Signature Date/Time: 05/23/2024/5:14:59 PM    Final    CT Renal Stone Study Result Date: 05/22/2024 CLINICAL DATA:  R  flank pain, recent cancer diagnosis, aki EXAM: CT ABDOMEN AND PELVIS WITHOUT CONTRAST TECHNIQUE: Multidetector CT  imaging of the abdomen and pelvis was performed following the standard protocol without IV contrast. RADIATION DOSE REDUCTION: This exam was performed according to the departmental dose-optimization program which includes automated exposure control, adjustment of the mA and/or kV according to patient size and/or use of iterative reconstruction technique. COMPARISON:  None Available. FINDINGS: Of note, the lack of intravenous contrast limits evaluation of the solid organ parenchyma and vascularity. Lower chest: For findings above the diaphragm, please see the separately dictated CT of the chest report, which was performed concurrently. Hepatobiliary: Subcentimeter hypodensity in the right hepatic lobe measuring 5 mm (axial 9). Left hepatic lobe hypodensity measuring 9 mm (axial 9). Segment 4 hypodensity measuring 1.1 cm (axial 17). 1.3 cm hypodensity abutting the gallbladder fossa in the right hepatic lobe inferiorly (axial 27).Focal fatty infiltration along the falciform ligament.Layering biliary sludge or punctate gallstones. No gallbladder wall thickening. No intrahepatic or extrahepatic biliary ductal dilation. Pancreas: No mass or main ductal dilation. No peripancreatic inflammation or fluid collection. Spleen: Normal size. No mass. Adrenals/Urinary Tract: No adrenal mass. Large cyst in the left upper pole. No hydronephrosis or nephrolithiasis. The urinary bladder is obscured by metallic streak artifact by both hip arthroplasties. Stomach/Bowel:Likely fluid-filled diverticulum arising from the posterior stomach (axial 11). The stomach contains ingested material without focal abnormality. No small bowel wall thickening or inflammation. No small bowel obstruction.Normal appendix. Vascular/Lymphatic: No aortic aneurysm. Diffuse aortoiliac atherosclerosis. No intraabdominal or pelvic lymphadenopathy.  Reproductive: The pelvic organs are obscured by metallic streak artifact from both hip arthroplasties. No free pelvic fluid. Other: No pneumoperitoneum, ascites, or mesenteric inflammation. Musculoskeletal: Destructive soft tissue mass in the right pedicle of L1, measuring 3.5 cm, encroaching into the neural foramen and causing significant spinal canal narrowing (axial 23). A smaller destructive mass in the right pedicle of the L4 vertebral body measures 1.7 cm, encroaching into the right neural foramen and the lateral right spinal canal (axial 39). 2.7 cm lytic lesion in the right L2 vertebral body. Both hip arthroplasties are anatomically aligned without dislocation. Age-indeterminate, fracture of the left superior pubic ramus at the puboacetabular junction. Large destructive mass filling the inferior left pubic ramus measuring 3.5 cm. IMPRESSION: 1. Multiple small hypodensities in the liver measuring up to 1.3 cm in the inferior right hepatic lobe (axial 27), worrisome for metastatic disease. 2. Diffuse bony metastatic disease throughout the abdomen and pelvis. For example, there is a destructive soft tissue mass in the right L1 pedicle, measuring 3.5 cm, causing significant spinal canal and neuroforaminal narrowing (axial 23). 3. Age-indeterminate, but favored to be subacute, fracture of the left superior pubic ramus at the puboacetabular junction. Large destructive mass filling the inferior left pubic ramus, measuring 3.5 cm. Electronically Signed   By: Rance Burrows M.D.   On: 05/22/2024 19:26   CT CHEST WO CONTRAST Result Date: 05/22/2024 CLINICAL DATA:  Mass of upper lobe of right lung. Abnormal x-Angela. History of left-sided breast cancer. Current smoker. EXAM: CT CHEST WITHOUT CONTRAST TECHNIQUE: Multidetector CT imaging of the chest was performed following the standard protocol without IV contrast. RADIATION DOSE REDUCTION: This exam was performed according to the departmental dose-optimization program  which includes automated exposure control, adjustment of the mA and/or kV according to patient size and/or use of iterative reconstruction technique. COMPARISON:  Chest radiograph 02/17/2024 FINDINGS: Cardiovascular: The heart is normal in size. There are coronary artery calcifications. No pericardial effusion. Moderate aortic atherosclerosis. No aortic aneurysm. Mediastinum/Nodes: Pathologic mediastinal adenopathy including a 19 mm short axis right paratracheal  node series 2, image 54. There additional prominent mediastinal lymph nodes. Hilar assessment is limited in the absence of IV contrast. Unremarkable appearance of the esophagus. Lungs/Pleura: Right upper lobe mass measures 5.8 x 5.7 cm, series 3, image 76. This causes bowing of the minor fissure. Centrally this lesion abuts the mediastinal fat with loss of fat plane. This is increased in size from prior chest radiograph. 3 mm left upper lobe nodule series 3, image 39. Mild emphysema. Mild subpleural reticulation within the dependent lower lobes. Mild retained secretions in the central airways. No pleural effusion or thickening. Upper Abdomen: Posterior gastric diverticulum. No adrenal nodule. 6.3 cm fluid density lesion arising from the lateral left kidney is likely cyst, although incompletely included in the field of view. The liver appears mildly heterogeneous but no discrete lesion is seen. Musculoskeletal: There are multiple lytic lesions throughout the ribs and spine consistent with osseous metastatic disease. Lytic lesion within T7 has associated mild pathologic compression fracture as well as loss of posterior cortical margin and possible extraosseous soft tissue component. There are lesions throughout multiple thoracic vertebra, posterior elements and many ribs. Fractures of lateral lower left ribs may be in part pathologic, and are likely subacute with surrounding callus formation. The largest rib lesion involves posterior left ninth rib, expansile  with soft tissue component. Bilateral scapular lucent lesions with a fracture involving the inferior right scapular body. Surgical clips in the left breast and axilla. IMPRESSION: 1. A 5.8 cm right upper lobe mass, increased in size from prior chest radiograph, highly suspicious for primary lung malignancy. 2. Pathologic mediastinal adenopathy, typical of metastatic disease. 3. 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 There is a mild pathologic compression fracture of T7. Soft tissue component with loss of posterior cortex and possible extraosseous soft tissue component. Recommend thoracic spine MRI to assess spinal canal patency. Fractures of lateral lower left ribs may be in part pathologic, and are likely subacute with surrounding callus formation. There is a fracture involving the inferior right scapular body. 4. Coronary artery calcifications. Aortic Atherosclerosis (ICD10-I70.0) and Emphysema (ICD10-J43.9). These results will be called to the ordering clinician or representative by the Radiologist Assistant, and communication documented in the PACS or [employer]. Electronically Signed   By: Chadwick Colonel M.D.   On: 05/22/2024 15:10   XR Lumbar Spine 2-3 Views Result Date: 04/27/2024 X-rays of the lumbar spine show diffuse degenerative changes and facet disease.  Grade 1 spondylolisthesis of L4-5 and degenerative disc disease at L5-S1.  No acute abnormalities.  XR HIPS BILAT W OR W/O PELVIS 3-4 VIEWS Result Date: 04/27/2024 X-rays of the pelvis and bilateral hips show status post bilateral total hip arthroplasties without any complications.      IMPRESSION/PLAN: 1. 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  She presents today with  diffuse osseous metastatic disease from breast primary. Imaging demonstrates compression fracture at T7 and soft tissue mass at L1 with significant spinal canal and neuroforaminal narrowing. She appears to be experiencing pain significant pain from the 12th right rib metastasis as well.  Dr. Jeryl Moris recommends palliative radiation to these areas.   Today, I talked to the patient and family about the findings and work-up thus far.  We discussed the natural history of metastatic breast cancer to the bone  and general treatment, highlighting the role of radiotherapy in the management.  We discussed the available radiation techniques, and focused on the details of logistics and delivery.  We reviewed the anticipated acute and late sequelae associated with radiation in this setting.  The patient was encouraged to ask questions that I answered to the best of my ability. A patient consent form was discussed and signed.  We retained a copy for our records.  The patient would like to proceed with radiation and is scheduled for CT simulation later today.  Anticipate 30 Gy in 10 fractions to the T7, L1, and 12 right rib metastases. She is scheduled for her first treatment on 05/28/2024.   Patient was educated on signs and symptoms concerning for cord compression and understands to notify a member of the care team immediately if she experiences them.    In a visit lasting 60 minutes, greater than 50% of the time was spent face to face discussing the patient's condition, in preparation for the discussion, and coordinating the patient's care.   The above documentation reflects my direct findings during this shared patient visit. Please see the separate note by Dr. Jeryl Moris on this date for the remainder of the patient's plan of care.    Julio Ohm, PA-C    **Disclaimer: This note was dictated with voice recognition software. Similar sounding words can inadvertently be transcribed and this note may contain  transcription errors which may not have been corrected upon publication of note.**

## 2024-05-25 NOTE — Assessment & Plan Note (Addendum)
 Creatinine 1.82 substantial acute kidney injury secondary to volume depletion and hypercalcemia  Continue intravenous hydration Strict input and output monitoring Minimizing use of nephrotoxic agents

## 2024-05-25 NOTE — Assessment & Plan Note (Deleted)
 Patient undergoing bronchoscopy with EBUS today and biopsies  Will review results of biopsies with oncology once available  seen on CT imaging measuring 5.8 x 5.7 cm  concern for primary lung malignancy Dr. Bertrum Brodie with PCCM following daily, their input is appreciated.

## 2024-05-25 NOTE — Assessment & Plan Note (Deleted)
 Continued improvement in acute kidney injury, creatinine of 1.73 substantial acute kidney injury secondary to volume depletion and hypercalcemia  Hydrating patient aggressively Strict input and output monitoring Minimizing use of nephrotoxic agents

## 2024-05-25 NOTE — Assessment & Plan Note (Addendum)
 Replaced

## 2024-05-25 NOTE — Assessment & Plan Note (Deleted)
 CT imaging revealing fracture of the left superior pubic ramus at the pubic acetabular junction as well as a large destructive mass filling the inferior pubic left pubic ramus measuring 3.5 cm Patient evaluated by orthopedic surgery, supportive care recommended, their input is appreciated.

## 2024-05-25 NOTE — Assessment & Plan Note (Signed)
 Continue amlodipine , metoprolol  Blood pressures well-controlled As needed intravenous hydralazine for markedly elevated blood pressures.

## 2024-05-25 NOTE — Assessment & Plan Note (Addendum)
 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 Lee Public Patient has been on letrozole  since April 2023, continuing Significant metastatic disease of the spine and pelvis, oncology to coordinate with radiation oncology for potential radiation therapy

## 2024-05-25 NOTE — Assessment & Plan Note (Deleted)
 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 Lee Public Patient has been on letrozole  since April 2023, continuing Significant metastatic disease of the spine, oncology to coordinate with radiation oncology for potential radiation therapy

## 2024-05-26 DIAGNOSIS — E876 Hypokalemia: Secondary | ICD-10-CM | POA: Diagnosis not present

## 2024-05-26 DIAGNOSIS — R918 Other nonspecific abnormal finding of lung field: Secondary | ICD-10-CM | POA: Diagnosis not present

## 2024-05-26 DIAGNOSIS — N179 Acute kidney failure, unspecified: Secondary | ICD-10-CM | POA: Diagnosis not present

## 2024-05-26 DIAGNOSIS — I10 Essential (primary) hypertension: Secondary | ICD-10-CM | POA: Diagnosis not present

## 2024-05-26 LAB — CBC WITH DIFFERENTIAL/PLATELET
Abs Immature Granulocytes: 0.26 10*3/uL — ABNORMAL HIGH (ref 0.00–0.07)
Basophils Absolute: 0 10*3/uL (ref 0.0–0.1)
Basophils Relative: 0 %
Eosinophils Absolute: 0 10*3/uL (ref 0.0–0.5)
Eosinophils Relative: 0 %
HCT: 30.9 % — ABNORMAL LOW (ref 36.0–46.0)
Hemoglobin: 10.2 g/dL — ABNORMAL LOW (ref 12.0–15.0)
Immature Granulocytes: 2 %
Lymphocytes Relative: 4 %
Lymphs Abs: 0.6 10*3/uL — ABNORMAL LOW (ref 0.7–4.0)
MCH: 29.1 pg (ref 26.0–34.0)
MCHC: 33 g/dL (ref 30.0–36.0)
MCV: 88 fL (ref 80.0–100.0)
Monocytes Absolute: 0.6 10*3/uL (ref 0.1–1.0)
Monocytes Relative: 4 %
Neutro Abs: 13.4 10*3/uL — ABNORMAL HIGH (ref 1.7–7.7)
Neutrophils Relative %: 90 %
Platelets: 304 10*3/uL (ref 150–400)
RBC: 3.51 MIL/uL — ABNORMAL LOW (ref 3.87–5.11)
RDW: 12.5 % (ref 11.5–15.5)
WBC: 14.8 10*3/uL — ABNORMAL HIGH (ref 4.0–10.5)
nRBC: 0 % (ref 0.0–0.2)

## 2024-05-26 LAB — COMPREHENSIVE METABOLIC PANEL WITH GFR
ALT: 19 U/L (ref 0–44)
AST: 16 U/L (ref 15–41)
Albumin: 2.6 g/dL — ABNORMAL LOW (ref 3.5–5.0)
Alkaline Phosphatase: 119 U/L (ref 38–126)
Anion gap: 6 (ref 5–15)
BUN: 23 mg/dL (ref 8–23)
CO2: 21 mmol/L — ABNORMAL LOW (ref 22–32)
Calcium: 9.5 mg/dL (ref 8.9–10.3)
Chloride: 108 mmol/L (ref 98–111)
Creatinine, Ser: 1.41 mg/dL — ABNORMAL HIGH (ref 0.44–1.00)
GFR, Estimated: 42 mL/min — ABNORMAL LOW (ref >60)
Glucose, Bld: 119 mg/dL — ABNORMAL HIGH (ref 70–99)
Potassium: 3.5 mmol/L (ref 3.5–5.1)
Sodium: 135 mmol/L (ref 135–145)
Total Bilirubin: 0.3 mg/dL (ref 0.0–1.2)
Total Protein: 6 g/dL — ABNORMAL LOW (ref 6.5–8.1)

## 2024-05-26 LAB — MAGNESIUM: Magnesium: 2.2 mg/dL (ref 1.7–2.4)

## 2024-05-26 MED ORDER — MELATONIN 5 MG PO TABS
10.0000 mg | ORAL_TABLET | Freq: Every evening | ORAL | Status: DC | PRN
  Administered 2024-05-26 – 2024-06-01 (×2): 10 mg via ORAL
  Filled 2024-05-26 (×3): qty 2

## 2024-05-26 MED ORDER — OXYCODONE HCL 5 MG PO TABS
5.0000 mg | ORAL_TABLET | Freq: Two times a day (BID) | ORAL | Status: DC
  Administered 2024-05-26 – 2024-06-02 (×15): 5 mg via ORAL
  Filled 2024-05-26 (×15): qty 1

## 2024-05-26 NOTE — Progress Notes (Signed)
 NAME:  Angela Lin, MRN:  [id number], DOB:  1961-03-13, LOS: 4 ADMISSION DATE:  05/22/2024, CONSULTATION DATE:  05/23/24 REFERRING MD:  TRiad MD Dr Alfred Imperial, CHIEF COMPLAINT:  Lung mass, bony meets. Mediastninal adenopathy   History of Present Illness:   63 year old female lives alone. Only blood family is 53 year old mom and brother in town. Has history of breast cancer followed by Dr Lee Public. Has ongoing > 20 pack smoking history.  She is on letrozole  since 2023 and status post lumpectomy and radiation for breast cancer.  She came into the ER with new onset of low back pain for 4 weeks without any urine diarrhea or stool incontinence.  She also had sciatica to the right lower extremity.  Denied any vomiting fever or chills.  Found to have hypercalcemia with a calcium  greater than 15.  Also acute kidney injury with a creatinine of 2.7 mg percent.  Also had worsening metastatic lesion to the spine T7 compression fracture and also spinal canal and neural narrowing in the lumbar area.  There was also a subacute fracture of the left superior rami and destructive lesion of the left inferior rami with metastasis.  She has been started on fluids, zoledronate and calcitonin.  She is pending an MRI.  CT scan of the chest showed Right upper lobe mass measures 5.8 x 5.7 cm, series 3, image 76. This causes bowing of the minor fissure. Centrally this lesion abuts the mediastinal fat with loss of fat plane.  In addition the mediastinal adenopathy [all present visualized].  Pulmonary has been consulted.  Upon walking to the room mother and patient extremely anxious about social situation upon discharge.  Fears of being able to manage illness alone.  They also declined MRI.  Educated about need for MRI and they were agreeable.   Past Medical History:    has a past medical history of Arthritis, Endometriosis, Heart murmur, and Hypertension.   has a past surgical history that includes Diagnostic laparoscopy;  Total hip arthroplasty (Right, 06/04/2016); Total hip arthroplasty (Left, 12/08/2018); Breast biopsy (Left, 03/22/2022); Breast lumpectomy (Left, 05/26/2022); Video bronchoscopy with endobronchial ultrasound (Bilateral, 05/24/2024); Video bronchoscopy with endobronchial navigation (Right, 05/24/2024); Video bronchoscopy with radial endobronchial ultrasound (05/24/2024); Bronchial needle aspiration biopsy (05/24/2024); Bronchial brushings (05/24/2024); Bronchial biopsy (05/24/2024); and Bronchial washings (05/24/2024).  Tobacco Use: High Risk (05/24/2024)   Patient History    Smoking Tobacco Use: Every Day    Smokeless Tobacco Use: Never    Passive Exposure: Not on file    Allergies  Allergen Reactions   Hydrochlorothiazide Other (See Comments)    Unknown    Significant Hospital Events:  05/22/2024 - admit 05/23/24 - Pulm consult  5/29-navigational bronchoscopy/EBUS  Interim History / Subjective:   No overnight events Still does have some pain and discomfort Breathing feels comfortable  Objective       05/26/2024    5:39 AM 05/25/2024    9:06 PM 05/25/2024    3:41 PM  Vitals with BMI  Systolic 121 107 409  Diastolic 69 62 65  Pulse 88 79 79    Today's Vitals   05/25/24 2324 05/26/24 0021 05/26/24 0539 05/26/24 0827  BP:   121/69   Pulse:   88   Resp:   18   Temp:   98 F (36.7 C)   TempSrc:   Oral   SpO2:   97%   Weight:      Height:      PainSc: 7  Asleep  10-Worst pain ever   Body mass index is 24.95 kg/m.  Middle-aged lady does not appear to be in distress Moist oral mucosa Clear breath sounds auscultation S1-S2 appreciated Bowel sounds appreciated Extremities shows no clubbing, no edema  CT scan chest 05/22/2024 reviewed  Assessment & Plan:   Right upper lobe mass - S/p EBUS/navigational bronchoscopy - Cytology/path results pending  Hypercalcemia - Being addressed  Pain from pathological fracture of lumbar vertebra, pelvis  Continue to await biopsy  results  She has been followed by radiation oncology for metastatic disease, plan for palliative radiation treatment to pathologic fractures   Myer Artis, MD [place] [facility] Pager: See Tilford Foley

## 2024-05-26 NOTE — Evaluation (Addendum)
 Physical Therapy Evaluation Patient Details Name: Angela Lin MRN: [id number] DOB: 12-Dec-1961 Today's Date: 05/26/2024  History of Present Illness  63 yo female admitted with abnormal labs-hypercalcemia, new R lung mass. Imaging showed multifocal bone mets throughout the ribs, spine and scapula, pathologic fracture of T7, lateral lower left ribs, inferior right scapula, L pubic ramus fx. Hx of neuroendocrine Ca-lumpectomy, met breast Ca.  Clinical Impression  On eval, pt was CGA for mobility. She walked ~75 feet with a RW. She was able to stand at sink and brush her teeth (pt was concerned about her ability to stand for an extended period of time). She tolerated session well. Assisted pt to recliner to sit up and continue visit with family. Will plan to follow and progress activity as tolerated. Recommend HHPT and RW, if pt is agreeable.   *Addendum: Pt and family would like to have, and are requesting, a short term rehab stay. PT recommendation has been updated to reflect this.       If plan is discharge home, recommend the following: A little help with walking and/or transfers;A little help with bathing/dressing/bathroom;Assistance with cooking/housework;Assist for transportation;Help with stairs or ramp for entrance   Can travel by private vehicle        Equipment Recommendations Rolling walker (2 wheels)  Recommendations for Other Services       Functional Status Assessment Patient has had a recent decline in their functional status and demonstrates the ability to make significant improvements in function in a reasonable and predictable amount of time.     Precautions / Restrictions Precautions Precautions: Fall Restrictions Weight Bearing Restrictions Per Provider Order: No      Mobility  Bed Mobility Overal bed mobility: Modified Independent                  Transfers Overall transfer level: Needs assistance Equipment used: Rolling walker (2  wheels) Transfers: Sit to/from Stand Sit to Stand: Supervision                Ambulation/Gait Ambulation/Gait assistance: Contact guard assist Gait Distance (Feet): 75 Feet Assistive device: Rolling walker (2 wheels) Gait Pattern/deviations: Step-through pattern, Decreased stride length       General Gait Details: No LOB with RW use. Pt denied dizziness. Tolerated distance well.  Stairs            Wheelchair Mobility     Tilt Bed    Modified Rankin (Stroke Patients Only)       Balance Overall balance assessment: Needs assistance         Standing balance support: During functional activity Standing balance-Leahy Scale: Fair                               Pertinent Vitals/Pain Pain Assessment Pain Assessment: 0-10 Pain Score: 2  Pain Intervention(s): Monitored during session    Home Living Family/patient expects to be discharged to:: Private residence Living Arrangements: Alone Available Help at Discharge: Family Type of Home: House Home Access: Stairs to enter Entrance Stairs-Rails: None Entrance Stairs-Number of Steps: 2   Home Layout: One level Home Equipment: Cane - single point      Prior Function Prior Level of Function : Independent/Modified Independent                     Extremity/Trunk Assessment   Upper Extremity Assessment Upper Extremity Assessment: Generalized weakness    Lower Extremity  Assessment Lower Extremity Assessment: Generalized weakness    Cervical / Trunk Assessment Cervical / Trunk Assessment: Normal  Communication   Communication Communication: No apparent difficulties    Cognition Arousal: Alert Behavior During Therapy: WFL for tasks assessed/performed   PT - Cognitive impairments: No apparent impairments                                 Cueing Cueing Techniques: Verbal cues     General Comments      Exercises     Assessment/Plan    PT Assessment Patient  needs continued PT services  PT Problem List Decreased strength;Decreased activity tolerance;Decreased balance;Decreased mobility;Pain;Decreased knowledge of use of DME       PT Treatment Interventions DME instruction;Gait training;Functional mobility training;Therapeutic activities;Therapeutic exercise;Patient/family education;Balance training    PT Goals (Current goals can be found in the Care Plan section)  Acute Rehab PT Goals Patient Stated Goal: none stated PT Goal Formulation: With patient Time For Goal Achievement: 06/09/24 Potential to Achieve Goals: Good    Frequency Min 3X/week     Co-evaluation               AM-PAC PT "6 Clicks" Mobility  Outcome Measure Help needed turning from your back to your side while in a flat bed without using bedrails?: None Help needed moving from lying on your back to sitting on the side of a flat bed without using bedrails?: None Help needed moving to and from a bed to a chair (including a wheelchair)?: None Help needed standing up from a chair using your arms (e.g., wheelchair or bedside chair)?: None Help needed to walk in hospital room?: A Little Help needed climbing 3-5 steps with a railing? : A Little 6 Click Score: 22    End of Session Equipment Utilized During Treatment: Gait belt Activity Tolerance: Patient tolerated treatment well Patient left: in chair;with call bell/phone within reach;with family/visitor present   PT Visit Diagnosis: Muscle weakness (generalized) (M62.81);Pain    Time: 2956-2130 PT Time Calculation (min) (ACUTE ONLY): 15 min   Charges:   PT Evaluation $PT Eval Low Complexity: 1 Low   PT General Charges $$ ACUTE PT VISIT: 1 Visit            Tanda Falter, PT Acute Rehabilitation  Office: [phone]

## 2024-05-26 NOTE — TOC Progression Note (Signed)
 Transition of Care [facility]) - Progression Note    Patient Details  Name: Angela Lin MRN: [id number] Date of Birth: [birth year]  [facility]) CM/SW Contact  Amaryllis Junior, [place] Phone Number: 05/26/2024, 4:01 PM  Clinical Narrative:    Pt is now recommended for SNF. PASRR created [id number]. FL2 completed and SNF referrals faxed out; awaiting bed offers.        Expected Discharge Plan and Services                                               Social Determinants of Health (SDOH) Interventions SDOH Screenings   Food Insecurity: No Food Insecurity (05/22/2024)  Housing: Low Risk  (05/22/2024)  Transportation Needs: No Transportation Needs (05/22/2024)  Utilities: Not At Risk (05/22/2024)  Tobacco Use: High Risk (05/24/2024)    Readmission Risk Interventions    05/26/2024    4:01 PM  Readmission Risk Prevention Plan  Post Dischage Appt Complete  Medication Screening Complete  Transportation Screening Complete

## 2024-05-26 NOTE — Assessment & Plan Note (Addendum)
 Patient continuing to have substantial back pain due to lytic lesions of the spine resulting in destructive mass identified in the right pedicle of L1, on the right pedicle of the L4 vertebral body as well as the L2 vertebral body Continue low-dose dexamethasone  to assist with inflammation and discomfort Opiate-based analgesics for pain control Offered IR consultation for possible kyphoplasty, patient has declined Dr. Lorri Rota with radiation oncology consulted who plans on initiating radiation therapy 6/2  Eventual plan for skilled nursing facility placement this week once bed available and authorization obtained.

## 2024-05-26 NOTE — Assessment & Plan Note (Addendum)
 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 Lee Public Patient has been on letrozole  since April 2023, continuing Significant metastatic disease of the spine and pelvis, oncology to coordinate with radiation oncology for potential radiation therapy

## 2024-05-26 NOTE — Assessment & Plan Note (Addendum)
 Continue amlodipine , metoprolol  Blood pressures well-controlled As needed intravenous hydralazine for markedly elevated blood pressures.

## 2024-05-26 NOTE — Progress Notes (Signed)
 PROGRESS NOTE   Angela Lin  ZHY:[id number] DOB: October 31, 1961 DOA: 05/22/2024 PCP: Helyn Lobstein, MD   Date of Service: the patient was seen and examined on 05/26/2024  Brief Narrative:  63 y.o. 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  Patient follows with Dr. Gudena. Patient had outpatient labs performed revealing severe hypercalcemia patient was instructed to come to the [facility] Emergency Department for evaluation.  Upon evaluation in the emergency department, CT imaging of the chest abdomen and pelvis was performed revealing a right upper lobe mass measuring 5.8 x 5.7 cm in addition to diffuse bony metastatic disease throughout the abdomen and pelvis with a destructive soft tissue mass in the right L1 pedicle causing significant spinal canal and neuroforaminal narrowing.  Case was discussed with Dr. Larrie Po of neurosurgery who recommended oncology and radiation oncology consultations for potential management.  Patient was initiated on intravenous fluids for the severe hypercalcemia and the hospitalist group was then called to assess the patient for admission to the hospital.  Patient was continuing aggressive intravenous volume resuscitation and was additionally provided with zoledronate and calcitonin.  Case was discussed with Dr. Gudena with oncology who would formally evaluate patient in consultation.  Considering the right upper lobe mass, Dr. Bertrum Brodie with PCCM was additionally consulted for recommendations on approach for biopsy.  EBUS was performed on 5/29 with biopsies obtained.  Concerning the patient's pathologic fractures of L1, L2 and L4, Dr. Lorri Rota with radiation oncology was also consulted with plans to initiate radiation therapy on 6/2.  Considering the patient's  guarded prognosis throughout the hospitalization multiple goals of care were undertaken and therefore Dr. Alejos Husband with palliative care was additionally consulted for assistance with having these conversations with the patient and family.   Assessment & Plan Hypercalcemia of malignancy Severe hypercalcemia of malignancy, improving Thought to be secondary to either progressive metastatic breast cancer or right upper lobe mass concerning for primary lung malignancy. Calcium  has now dropped to 9.5 Calcitonin was discontinued on 5/29 Zometa  4 mg given on 5/27 Transitioning off of intravenous volume resuscitation with normal saline today Serial chemistries to monitor calcium  levels closely Mass of upper lobe of right lung Patient underwent bronchoscopy with EBUS 5/29 with biopsies obtained Will review results of biopsies with oncology once available  seen on CT imaging measuring 5.8 x 5.7 cm  concern for primary lung malignancy PCCM following daily, their input is appreciated.   AKI (acute kidney injury) (HCC) Creatinine continuing to downtrend, now 1.41 substantial acute kidney injury secondary to volume depletion and hypercalcemia  Transitioning off of intravenous fluids today Strict input and output monitoring Minimizing use of nephrotoxic agents Pathologic fracture of lumbar vertebra, initial encounter Patient continuing to have substantial back pain due to lytic lesions of the spine resulting in destructive mass identified in the right pedicle of L1, on the right pedicle of the L4 vertebral body as well as the L2 vertebral body Continue low-dose dexamethasone  to assist with inflammation and discomfort Opiate-based analgesics for pain control Offered IR consultation for possible kyphoplasty, patient has declined Dr. Lorri Rota with radiation oncology consulted who plans on initiating radiation therapy 6/2  Pathological fracture, pelvis, initial encounter for fracture CT imaging revealing  fracture of the left superior pubic ramus at the pubic acetabular junction as well as a large destructive mass filling the inferior pubic left pubic ramus measuring 3.5 cm  Patient evaluated by orthopedic surgery on 5/28, supportive care recommended, their input is appreciated. 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 Lee Public Patient has been on letrozole  since April 2023, continuing Significant metastatic disease of the spine and pelvis, oncology to coordinate with radiation oncology for potential radiation therapy Essential hypertension Continue amlodipine , metoprolol  Blood pressures well-controlled As needed intravenous hydralazine  for markedly elevated blood pressures. Hypokalemia Replaced  Goals of care, counseling/discussion Visit and goals of care discussions with patient and family daily Patient wishes to be DNR/DNI at this time Prognosis guarded/poor Patient is strongly considering hospice services Dr. Alejos Husband with Palliative care consulted to assist with facilitating these conversations, their assistance is appreciated.   Subjective:  Patient reports back pain is moderate in intensity, nonradiating, worse with weightbearing or ambulation, associated with difficulty with balance.   Patient denies any associated incontinence.  Physical Exam:  Vitals:   05/25/24 2106 05/26/24 0539 05/26/24 1236 05/26/24 2041  BP: 107/62 121/69 109/66 129/65  Pulse: 79 88 74 74  Resp: 18 18 17 18   Temp: 98 F (36.7 C) 98 F (36.7 C) 97.7 F (36.5 C) 98 F (36.7 C)  TempSrc: Oral Oral Oral   SpO2: 97% 97% 98% 98%  Weight:      Height:        Constitutional: Lethargic but arousable and oriented x3, no associated distress.   Skin: no rashes, no lesions, good skin turgor noted. Eyes: Pupils are equally reactive to light.  No evidence of  scleral icterus or conjunctival pallor.  ENMT: Moist mucous membranes noted.  Posterior pharynx clear of any exudate or lesions.   Respiratory: clear to auscultation bilaterally, no wheezing, no crackles. Normal respiratory effort. No accessory muscle use.  Cardiovascular: Regular rate and rhythm, no murmurs / rubs / gallops. No extremity edema. 2+ pedal pulses. No carotid bruits.  Abdomen: Abdomen is soft and nontender.  No evidence of intra-abdominal masses.  Positive bowel sounds noted in all quadrants.   Musculoskeletal: No joint deformity upper and lower extremities. Good ROM, no contractures. Normal muscle tone.    Data Reviewed:  I have personally reviewed and interpreted labs, imaging.  Significant findings are   CBC: Recent Labs  Lab 05/22/24 1736 05/23/24 0026 05/24/24 0405 05/25/24 0359 05/26/24 0425  WBC 9.0 6.6 7.0 10.3 14.8*  NEUTROABS 6.6  --  5.9 9.5* 13.4*  HGB 12.8 12.6 10.8* 10.8* 10.2*  HCT 37.5 38.2 32.6* 32.1* 30.9*  MCV 85.2 89.0 88.6 86.5 88.0  PLT 351 311 275 261 304   Basic Metabolic Panel: Recent Labs  Lab 05/23/24 0851 05/23/24 1738 05/24/24 0405 05/25/24 0359 05/26/24 0425  NA 135 134* 133* 135 135  K 3.7 3.2* 3.1* 3.5 3.5  CL 103 102 104 106 108  CO2 24 24 21* 21* 21*  GLUCOSE 96 96 92 119* 119*  BUN 30* 26* 22 23 23   CREATININE 2.09* 2.14* 1.73* 1.82* 1.41*  CALCIUM  14.1* 13.1* 12.0* 11.0* 9.5  MG  --   --   --  1.2* 2.2   GFR: Estimated Creatinine Clearance: 37.2 mL/min (A) (by C-G formula based on SCr of 1.41 mg/dL (H)). Liver Function Tests: Recent Labs  Lab 05/22/24 1736 05/23/24 0026 05/24/24 0405 05/25/24 0359 05/26/24 0425  AST 21 18 17 17 16   ALT 27 24 17 19 19   ALKPHOS 151* 140* 117 122  119  BILITOT 0.8 0.4 0.6 0.6 0.3  PROT 7.5 6.9 6.0* 6.3* 6.0*  ALBUMIN 3.3* 3.0* 2.7* 2.5* 2.6*    Coagulation Profile: Recent Labs  Lab 05/24/24 0405  INR 1.1      Code Status:  DNR.  Code status decision has been  confirmed with: patient Family Communication: Mother updated on plan of care 5/30    Severity of Illness:  The appropriate patient status for this patient is INPATIENT. Inpatient status is judged to be reasonable and necessary in order to provide the required intensity of service to ensure the patient's safety. The patient's presenting symptoms, physical exam findings, and initial radiographic and laboratory data in the context of their chronic comorbidities is felt to place them at high risk for further clinical deterioration. Furthermore, it is not anticipated that the patient will be medically stable for discharge from the hospital within 2 midnights of admission.   * I certify that at the point of admission it is my clinical judgment that the patient will require inpatient hospital care spanning beyond 2 midnights from the point of admission due to high intensity of service, high risk for further deterioration and high frequency of surveillance required.*  Time spent:  50 minutes  Author:  True Fuss MD  05/26/2024 10:15 PM

## 2024-05-26 NOTE — Assessment & Plan Note (Addendum)
 Replaced

## 2024-05-26 NOTE — Assessment & Plan Note (Addendum)
 Patient underwent bronchoscopy with EBUS 5/29 with biopsies obtained Will review results of biopsies with oncology once available  seen on CT imaging measuring 5.8 x 5.7 cm  concern for primary lung malignancy PCCM following daily, their input is appreciated.

## 2024-05-26 NOTE — [place] FL2 (Signed)
 [place]  MEDICAID FL2 LEVEL OF CARE FORM     IDENTIFICATION  Patient Name: Angela Lin Birthdate: Dec 27, 1961 Sex: female Admission Date (Current Location): 05/22/2024  [facility] and [place] Number:  Producer, television/film/video and Address:  [facility],  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) 05/25/2024   Pathologic fracture of lumbar vertebra, initial encounter 05/25/2024   Goals of care, counseling/discussion 05/25/2024   Mass of upper lobe of right lung 05/23/2024   Pathological fracture, pelvis, initial encounter for fracture 05/23/2024   Hypercalcemia of malignancy 05/22/2024   AKI (acute kidney injury) (HCC) 05/22/2024   Hypokalemia 05/22/2024   Essential hypertension 05/16/2023   Elevated LDL cholesterol level 05/16/2023   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) 06/23/2022   Other malignant neuroendocrine tumors (HCC) 03/31/2022   Unilateral primary osteoarthritis, left hip 12/08/2018   Status post total replacement of left hip 12/08/2018   Osteoarthritis of right hip 06/04/2016   Avascular necrosis of bones of both hips (HCC) 06/04/2016   Status post total replacement of right hip 06/04/2016    Orientation RESPIRATION BLADDER Height & Weight     Self, Time, Situation, Place  Normal Incontinent Weight: 149 lb 14.6 oz (68 kg) Height:  5\' 5"  (165.1 cm)  BEHAVIORAL SYMPTOMS/MOOD NEUROLOGICAL BOWEL NUTRITION STATUS      Incontinent Diet (regular)   AMBULATORY STATUS COMMUNICATION OF NEEDS Skin   Limited Assist Verbally Normal                       Personal Care Assistance Level of Assistance  Bathing, Feeding, Dressing Bathing Assistance: Limited assistance Feeding assistance: Independent Dressing Assistance: Limited assistance     Functional Limitations Info  Sight, Hearing, Speech Sight Info: Impaired Hearing Info: Adequate Speech Info: Adequate    SPECIAL CARE FACTORS FREQUENCY  PT (By licensed PT), OT (By licensed OT)     PT Frequency: 5x/wk OT Frequency: 5x/wk            Contractures Contractures Info: Not present    Additional Factors Info  Code Status, Allergies Code Status Info: DNR Allergies Info: Hydrochlorothiazide           Current Medications (05/26/2024):  This is the current hospital active medication list Current Facility-Administered Medications  Medication Dose Route Frequency Provider Last Rate Last Admin   amLODipine  (NORVASC ) tablet 5 mg  5 mg Oral Daily Kakrakandy, Arshad N, MD   5 mg at 05/26/24 3664   atorvastatin  (LIPITOR) tablet 10 mg  10 mg Oral Daily Shalhoub, George J, MD   10 mg at 05/26/24 4034   dexamethasone  (DECADRON ) tablet 2 mg  2 mg Oral BID Shalhoub, George J, MD   2 mg at 05/26/24 7425   feeding supplement (ENSURE PLUS HIGH PROTEIN) liquid 237 mL  237 mL Oral BID BM Shalhoub, George J, MD   237 mL at 05/26/24 1412   hydrALAZINE  (APRESOLINE ) injection 10 mg  10 mg Intravenous Q6H  PRN Shalhoub, Merrill Abide, MD       oxyCODONE  (Oxy IR/ROXICODONE ) immediate release tablet 5 mg  5 mg Oral Q4H PRN Shalhoub, George J, MD   5 mg at 05/26/24 2956   Or   HYDROmorphone  (DILAUDID ) injection 0.5 mg  0.5 mg Intravenous Q4H PRN Shalhoub, George J, MD       letrozole  (FEMARA ) tablet 2.5 mg  2.5 mg Oral Daily [name], MD   2.5 mg at 05/26/24 2130   melatonin tablet 10 mg  10 mg Oral QHS PRN Shalhoub, George J, MD       metoprolol  succinate (TOPROL -XL) 24 hr tablet 150 mg   150 mg Oral Daily Kakrakandy, Arshad N, MD   150 mg at 05/26/24 8657   nystatin  (MYCOSTATIN ) 100000 UNIT/ML suspension 500,000 Units  5 mL Oral QID Shalhoub, George J, MD   500,000 Units at 05/26/24 1411   oxyCODONE  (Oxy IR/ROXICODONE ) immediate release tablet 5 mg  5 mg Oral BID Shalhoub, George J, MD   5 mg at 05/26/24 1531     Discharge Medications: Please see discharge summary for a list of discharge medications.  Relevant Imaging Results:  Relevant Lab Results:   Additional Information SSN [id number]  Amaryllis Junior, LCSW

## 2024-05-26 NOTE — Consult Note (Signed)
 Consultation Note Date: 05/26/2024   Patient Name: Angela Lin  DOB: 1961/07/16  MRN: [id number]  Age / Sex: 63 y.o., female  PCP: Helyn Lobstein, MD Referring Physician: True Fuss, MD  Reason for Consultation: Establishing goals of care  HPI/Patient Profile: 63 y.o. female admitted on 05/22/2024  Clinical Assessment and Goals of Care: 63 year old lady with a history of breast cancer, admitted with new onset of low back pain, Sciatica ,found to have metastatic lesion T7 spinal compression fracture, subacute fracture of left superior rami and destructive lesion left inferior rami with metastases, acute kidney injury right upper lobe mass status post EBUS/navigational bronchoscopy, cytology path results pending.  Patient has pain from pathological fracture of lumbar vertebra and pelvis. Patient has past medical history of hypertension dyslipidemia. Patient remains admitted to hospital medicine service Oncology and pulmonology following Radiation oncology also consulted Palliative consult for ongoing goals of care discussions has been requested. Chart reviewed Patient seen Discussed with patient and brother Palliative medicine is specialized medical care for people living with serious illness. It focuses on providing relief from the symptoms and stress of a serious illness. The goal is to improve quality of life for both the patient and the family. Goals of care: Broad aims of medical therapy in relation to the patient's values and preferences. Our aim is to provide medical care aimed at enabling patients to achieve the goals that matter most to them, given the circumstances of their particular medical situation and their constraints.   Goals of care discussions undertaken.  Attempted to understand the patient and her brothers goals wishes and values for her current situation.  See below. NEXT  OF KIN  Mother and brother.   SUMMARY OF RECOMMENDATIONS   Agree with DNR Continue current mode of care Continue with current pain management has oxycodone  to be used on an as-needed basis as well as IV Dilaudid  for rescue monitor as needed oral opioid use so as to be able to start long-acting opioids for ongoing pain and symptom management. Recommend outpatient palliative care at [facility]. Patient in favor of skilled nursing facility rehabilitation attempt and will request addition of palliative services over there if possible. Patient willing to proceed with radiation therapy from Tuesday, 05-29-2024. Goals of care discussions are ongoing, I met with the patient and brother, plan on meeting with the patient and mother and brother on 29 - 1 - 25 at 12 noon. Thank you for the consult.  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 The following aspects are pertinent.  Past Medical History:  Diagnosis Date   Arthritis    osteoarthritis- hip   Endometriosis    Heart murmur    as a child, reports as benign    Hypertension    Social History   Socioeconomic History   Marital status: Divorced    Spouse name: Not on file   Number of children: Not on file   Years of education: Not on file   Highest education level: Not on file  Occupational History   Not on file  Tobacco Use   Smoking  status: Every Day    Current packs/day: 1.00    Average packs/day: 1 pack/day for 20.0 years (20.0 ttl pk-yrs)    Types: Cigarettes   Smokeless tobacco: Never   Tobacco comments:    past was able to quit- now smoking 0.5 ppd  Vaping Use   Vaping status: Some Days  Substance and Sexual Activity   Alcohol use: Yes    Comment: 2-3 times weekly   Drug use: No   Sexual activity: Not Currently    Birth control/protection: Post-menopausal  Other Topics Concern   Not on file  Social History Narrative   Not on file   Social Drivers of Health   Financial Resource Strain: Not on file  Food Insecurity: No Food Insecurity (05/22/2024)   Hunger Vital Sign    Worried About Running Out of Food in the Last Year: Never true    Ran Out of Food in the Last Year: Never true  Transportation Needs: No Transportation Needs (05/22/2024)   PRAPARE - Administrator, Civil Service (Medical): No    Lack of Transportation (Non-Medical): No  Physical Activity: Not on file  Stress: Not on file  Social Connections: Not on file   Family History  Problem Relation Age of Onset   Cancer Father        Lung cancer- passed 2005    Hypertension Brother    Scheduled Meds:  amLODipine   5 mg Oral Daily   atorvastatin   10 mg Oral Daily   dexamethasone   2 mg Oral BID   feeding supplement  237 mL Oral BID BM   letrozole   2.5 mg Oral Daily   metoprolol  succinate  150 mg Oral Daily   nystatin   5 mL Oral QID   oxyCODONE   5 mg Oral BID   Continuous Infusions: PRN Meds:.hydrALAZINE , oxyCODONE  **OR** HYDROmorphone  (DILAUDID ) injection, melatonin Medications Prior to Admission:  Prior to Admission medications   Medication Sig Start Date End Date Taking? Authorizing Provider  amLODipine  (NORVASC ) 5 MG tablet Take 1 tablet (5 mg total) by mouth daily. 04/18/24  Yes   aspirin  81 MG chewable tablet Chew 81 mg by mouth at bedtime.   Yes [provider]  atorvastatin  (LIPITOR) 10 MG tablet Take 1  tablet (10 mg total) by mouth daily. 10/03/23  Yes   Biotin w/ Vitamins C & E (HAIR/SKIN/NAILS PO) Take 1 tablet by mouth daily.   Yes [provider]  clotrimazole  (MYCELEX ) 10 MG troche Take 1 tablet (10 mg total) by mouth five times a day while on immunosuppressive medicine 05/22/24  Yes   cyclobenzaprine  (FLEXERIL ) 5 MG tablet Take 1 tablet (5 mg total) by mouth at bedtime as needed for muscle spasms. Patient taking differently: Take 5 mg by mouth as needed for muscle spasms. 05/01/24  Yes Sandie Cross, PA-C  diphenhydrAMINE  (BENADRYL ) 25 mg capsule Take 25 mg  by mouth at bedtime.   Yes [provider]  fluticasone  (FLONASE ) 50 MCG/ACT nasal spray Place 2 sprays into both nostrils daily. Patient taking differently: Place 1 spray into both nostrils daily as needed for allergies. 03/23/23  Yes   losartan  (COZAAR ) 100 MG tablet Take 1 tablet (100 mg total) by mouth daily. 04/18/24  Yes   metoprolol  succinate (TOPROL -XL) 100 MG 24 hr tablet Take 1.5 tablets (150 mg total) by mouth daily. 04/17/24  Yes   milk thistle 175 MG tablet Take 175 mg by mouth daily.   Yes [provider]  naproxen sodium (ALEVE) 220 MG tablet Take 220 mg by mouth daily as needed (pain).   Yes [provider]  tiZANidine  (ZANAFLEX ) 4 MG tablet Take 1 tablet (4 mg total) by mouth at bedtime as needed. Patient taking differently: Take 4 mg by mouth daily as needed for muscle spasms. 12/19/23  Yes   traMADol  (ULTRAM ) 50 MG tablet Take 1 tablet (50 mg total) by mouth every 6 (six) hours as needed for 10 days 05/22/24  Yes   amoxicillin -clavulanate (AUGMENTIN ) 875-125 MG tablet Take 1 tablet by mouth every 12 (twelve) hours. Patient not taking: Reported on 05/23/2024 02/23/24   Raspet, [place] Dales K, PA-C  atorvastatin  (LIPITOR) 10 MG tablet Take 1 tablet (10 mg total) by mouth daily. 01/03/24     predniSONE  (STERAPRED UNI-PAK 21 TAB) 10 MG (21) TBPK tablet Take as directed per package instructions Patient  not taking: Reported on 05/23/2024 05/01/24   Sandie Cross, PA-C   Allergies  Allergen Reactions   Hydrochlorothiazide Other (See Comments)    Unknown    Review of Systems + weakness + R shoulder pain.   Physical Exam Patient is awake alert resting in a chair No acute distress No peripheral edema Abdomen is nondistended Regular work of breathing  Vital Signs: BP 109/66 (BP Location: Right Arm)   Pulse 74   Temp 97.7 F (36.5 C) (Oral)   Resp 17   Ht 5\' 5"  (1.651 m)   Wt 68 kg   LMP 08/27/2008 (Approximate)   SpO2 98%   BMI 24.95 kg/m  Pain Scale: 0-10 POSS *See Group Information*: 1-Acceptable,Awake and alert Pain Score: Asleep   SpO2: SpO2: 98 % O2 Device:SpO2: 98 % O2 Flow Rate: .O2 Flow Rate (L/min): 1 L/min  IO: Intake/output summary:  Intake/Output Summary (Last 24 hours) at 05/26/2024 1500 Last data filed at 05/26/2024 1610 Gross per 24 hour  Intake 560 ml  Output --  Net 560 ml    LBM: Last BM Date :  (pt unable to recall) Baseline Weight: Weight: 68 kg Most recent weight: Weight: 68 kg     Palliative Assessment/Data:   PPS 50%  Time In:  1400 Time Out:  1500 Time Total:  60  Greater than 50%  of this time was spent counseling and coordinating care related to the above assessment and plan.  Signed by: Lujean Sake, MD   Please contact Palliative Medicine Team phone at [phone] for questions and concerns.  For individual provider: See Tilford Foley

## 2024-05-26 NOTE — Evaluation (Signed)
 Occupational Therapy Evaluation Patient Details Name: Angela Lin MRN: [id number] DOB: 05/20/1961 Today's Date: 05/26/2024   History of Present Illness   63 yr old female admitted with hypercalcemia, new R lung mass. Imaging also showed multifocal bone mets throughout the ribs, spine and scapula, pathologic fracture of T7, lateral lower left ribs, inferior right scapula, L pubic ramus fx. PMH: metastatic breast CA s/p lumpectomy and radiation, arthritis, endometriosis, B THA     Clinical Impressions The pt is currently presenting below her baseline level of functioning for self-care management, as she is limited by the below listed deficits (see OT problem list). At current, she requires SBA to CGA for tasks, including lower body dressing, toileting at bathroom level, and grooming in standing at the sink. During the session, she reported having 9/10 R shoulder pain, especially when trying to raise her arm, as well as significant pain in both hips in standing & being worse with ambulation. As such, her overall standing tolerance is compromised. She will benefit from further OT services to maximize her independence with self-care tasks and decrease the risk for restricted participation in meaningful activities. She expressed she would like to discuss potential diagnosis specific treatment options and anticipated quality of life with her care team and family, before deciding on whether or not she wishes to pursue post-acute care therapy services.     If plan is discharge home, recommend the following:   Assistance with cooking/housework;Help with stairs or ramp for entrance     Functional Status Assessment   Patient has had a recent decline in their functional status and demonstrates the ability to make significant improvements in function in a reasonable and predictable amount of time.     Equipment Recommendations   Tub/shower seat     Recommendations for Other Services          Precautions/Restrictions   Restrictions Weight Bearing Restrictions Per Provider Order: No     Mobility Bed Mobility Overal bed mobility: Modified Independent                  Transfers Overall transfer level: Needs assistance Equipment used: Rolling walker (2 wheels) Transfers: Sit to/from Stand Sit to Stand: Supervision                  Balance     Sitting balance-Leahy Scale: Good       Standing balance-Leahy Scale: Fair       ADL either performed or assessed with clinical judgement   ADL Overall ADL's : Needs assistance/impaired Eating/Feeding: Independent;Sitting   Grooming: Contact guard assist;Standing Grooming Details (indicate cue type and reason): She performed hand washing in standing at the sink.         Upper Body Dressing : Set up;Sitting   Lower Body Dressing: Set up;Supervision/safety;Sitting/lateral leans Lower Body Dressing Details (indicate cue type and reason): She donned her socks seated EOB. Toilet Transfer: Contact guard assist;Rolling walker (2 wheels);Ambulation Toilet Transfer Details (indicate cue type and reason): She ambulated to the bathroom in her room using a RW. Toileting- Clothing Manipulation and Hygiene: Contact guard assist               Vision   Additional Comments: She correctly read the time depicted on the wall clock.            Pertinent Vitals/Pain Pain Assessment Pain Assessment: 0-10 Pain Score: 9  Pain Location: R shoulder when trying to raise. Also, hips with standing and activity Pain Intervention(s):  Limited activity within patient's tolerance, Repositioned     Extremity/Trunk Assessment Upper Extremity Assessment Upper Extremity Assessment: Right hand dominant;RUE deficits/detail RUE Deficits / Details: Pt reported pain at at the shoulder joint, especially when attempting to raise arm; elbow and hand AROM WFL. Grip strength 4+/5. AROM for shoulder flexion ~130 degrees    Lower Extremity Assessment Lower Extremity Assessment: Overall WFL for tasks assessed   Cervical / Trunk Assessment Cervical / Trunk Assessment: Normal   Communication Communication Communication: No apparent difficulties   Cognition Arousal: Alert Behavior During Therapy: WFL for tasks assessed/performed               OT - Cognition Comments: Oriented x4                 Following commands: Intact                  Home Living Family/patient expects to be discharged to:: Private residence Living Arrangements: Alone Available Help at Discharge: Family Type of Home: House Home Access: Stairs to enter Secretary/administrator of Steps: 2 Entrance Stairs-Rails: Left;Right Home Layout: One level     Bathroom Shower/Tub: Tub/shower unit         Home Equipment: Cane - single point          Prior Functioning/Environment Prior Level of Function : Independent/Modified Independent;Driving             Mobility Comments: She started using a cane ~2 weeks ago. ADLs Comments: She was independent with ADLs, cooking, cleaning, and driving.    OT Problem List: Decreased activity tolerance;Decreased knowledge of use of DME or AE;Impaired UE functional use;Pain;Impaired balance (sitting and/or standing)   OT Treatment/Interventions: Self-care/ADL training;Therapeutic exercise;Therapeutic activities;Energy conservation;Patient/family education;DME and/or AE instruction;Balance training      OT Goals(Current goals can be found in the care plan section)   Acute Rehab OT Goals OT Goal Formulation: With patient Time For Goal Achievement: 06/09/24 Potential to Achieve Goals: Good ADL Goals Pt Will Perform Grooming: with modified independence;standing Pt Will Perform Lower Body Dressing: with modified independence;sitting/lateral leans;sit to/from stand Pt Will Transfer to Toilet: with modified independence;ambulating Pt Will Perform Toileting - Clothing  Manipulation and hygiene: with modified independence;sit to/from stand   OT Frequency:  Min 2X/week       AM-PAC OT "6 Clicks" Daily Activity     Outcome Measure Help from another person eating meals?: None Help from another person taking care of personal grooming?: None Help from another person toileting, which includes using toliet, bedpan, or urinal?: A Little Help from another person bathing (including washing, rinsing, drying)?: A Little Help from another person to put on and taking off regular upper body clothing?: A Little Help from another person to put on and taking off regular lower body clothing?: A Little 6 Click Score: 20   End of Session Equipment Utilized During Treatment: Rolling walker (2 wheels) Nurse Communication: Mobility status  Activity Tolerance: Patient limited by pain Patient left: in bed;with call bell/phone within reach;with bed alarm set  OT Visit Diagnosis: Pain;Other abnormalities of gait and mobility (R26.89) Pain - Right/Left: Right Pain - part of body: Shoulder (and hips)                Time: 1610-9604 OT Time Calculation (min): 21 min Charges:  OT General Charges $OT Visit: 1 Visit OT Evaluation $OT Eval Moderate Complexity: 1 Mod    Latayna Ritchie L Saleemah Mollenhauer, OTR/L 05/26/2024, 4:06 PM

## 2024-05-26 NOTE — Assessment & Plan Note (Addendum)
 Creatinine continuing to downtrend, now 1.41 substantial acute kidney injury secondary to volume depletion and hypercalcemia  Transitioning off of intravenous fluids today Strict input and output monitoring Minimizing use of nephrotoxic agents

## 2024-05-26 NOTE — Assessment & Plan Note (Addendum)
 CT imaging revealing fracture of the left superior pubic ramus at the pubic acetabular junction as well as a large destructive mass filling the inferior pubic left pubic ramus measuring 3.5 cm Patient evaluated by orthopedic surgery on 5/28, supportive care recommended, their input is appreciated.

## 2024-05-26 NOTE — Assessment & Plan Note (Addendum)
 Severe hypercalcemia of malignancy, improving Thought to be secondary to either progressive metastatic breast cancer or right upper lobe mass concerning for primary lung malignancy. Calcium  has now dropped to 9.5 Calcitonin was discontinued on 5/29 Zometa  4 mg given on 5/27 Transitioning off of intravenous volume resuscitation with normal saline today Serial chemistries to monitor calcium  levels closely

## 2024-05-26 NOTE — Assessment & Plan Note (Signed)
 Visit and goals of care discussions with patient and family daily Patient wishes to be DNR/DNI at this time Prognosis guarded/poor Patient is strongly considering hospice services Dr. Alejos Husband with Palliative care consulted to assist with facilitating these conversations, their assistance is appreciated.

## 2024-05-27 DIAGNOSIS — Z7189 Other specified counseling: Secondary | ICD-10-CM | POA: Diagnosis not present

## 2024-05-27 DIAGNOSIS — E876 Hypokalemia: Secondary | ICD-10-CM | POA: Diagnosis not present

## 2024-05-27 DIAGNOSIS — M8448XA Pathological fracture, other site, initial encounter for fracture: Secondary | ICD-10-CM

## 2024-05-27 DIAGNOSIS — N179 Acute kidney failure, unspecified: Secondary | ICD-10-CM | POA: Diagnosis not present

## 2024-05-27 LAB — CBC WITH DIFFERENTIAL/PLATELET
Abs Immature Granulocytes: 0.41 10*3/uL — ABNORMAL HIGH (ref 0.00–0.07)
Basophils Absolute: 0 10*3/uL (ref 0.0–0.1)
Basophils Relative: 0 %
Eosinophils Absolute: 0 10*3/uL (ref 0.0–0.5)
Eosinophils Relative: 0 %
HCT: 31.7 % — ABNORMAL LOW (ref 36.0–46.0)
Hemoglobin: 10.6 g/dL — ABNORMAL LOW (ref 12.0–15.0)
Immature Granulocytes: 3 %
Lymphocytes Relative: 4 %
Lymphs Abs: 0.6 10*3/uL — ABNORMAL LOW (ref 0.7–4.0)
MCH: 28.8 pg (ref 26.0–34.0)
MCHC: 33.4 g/dL (ref 30.0–36.0)
MCV: 86.1 fL (ref 80.0–100.0)
Monocytes Absolute: 0.8 10*3/uL (ref 0.1–1.0)
Monocytes Relative: 6 %
Neutro Abs: 12.5 10*3/uL — ABNORMAL HIGH (ref 1.7–7.7)
Neutrophils Relative %: 87 %
Platelets: 305 10*3/uL (ref 150–400)
RBC: 3.68 MIL/uL — ABNORMAL LOW (ref 3.87–5.11)
RDW: 12.5 % (ref 11.5–15.5)
WBC: 14.4 10*3/uL — ABNORMAL HIGH (ref 4.0–10.5)
nRBC: 0 % (ref 0.0–0.2)

## 2024-05-27 LAB — RENAL FUNCTION PANEL
Albumin: 2.8 g/dL — ABNORMAL LOW (ref 3.5–5.0)
Anion gap: 10 (ref 5–15)
BUN: 22 mg/dL (ref 8–23)
CO2: 20 mmol/L — ABNORMAL LOW (ref 22–32)
Calcium: 9.2 mg/dL (ref 8.9–10.3)
Chloride: 106 mmol/L (ref 98–111)
Creatinine, Ser: 1.48 mg/dL — ABNORMAL HIGH (ref 0.44–1.00)
GFR, Estimated: 40 mL/min — ABNORMAL LOW (ref >60)
Glucose, Bld: 125 mg/dL — ABNORMAL HIGH (ref 70–99)
Phosphorus: 1.7 mg/dL — ABNORMAL LOW (ref 2.5–4.6)
Potassium: 3.2 mmol/L — ABNORMAL LOW (ref 3.5–5.1)
Sodium: 136 mmol/L (ref 135–145)

## 2024-05-27 LAB — PTH-RELATED PEPTIDE: PTH-related peptide: 2.9 pmol/L

## 2024-05-27 MED ORDER — NYSTATIN 100000 UNIT/ML MT SUSP
5.0000 mL | Freq: Four times a day (QID) | OROMUCOSAL | Status: AC
  Administered 2024-05-27 – 2024-05-28 (×7): 500000 [IU] via ORAL
  Filled 2024-05-27 (×7): qty 5

## 2024-05-27 MED ORDER — SENNA 8.6 MG PO TABS
2.0000 | ORAL_TABLET | Freq: Every day | ORAL | Status: DC
  Administered 2024-05-27 – 2024-06-01 (×3): 17.2 mg via ORAL
  Filled 2024-05-27 (×11): qty 2

## 2024-05-27 MED ORDER — POLYETHYLENE GLYCOL 3350 17 G PO PACK
17.0000 g | PACK | Freq: Every day | ORAL | Status: DC | PRN
  Administered 2024-05-27 – 2024-06-07 (×9): 17 g via ORAL
  Filled 2024-05-27 (×9): qty 1

## 2024-05-27 MED ORDER — POTASSIUM CHLORIDE CRYS ER 20 MEQ PO TBCR
40.0000 meq | EXTENDED_RELEASE_TABLET | Freq: Once | ORAL | Status: AC
  Administered 2024-05-27: 40 meq via ORAL
  Filled 2024-05-27: qty 2

## 2024-05-27 NOTE — Assessment & Plan Note (Signed)
 CT imaging revealing fracture of the left superior pubic ramus at the pubic acetabular junction as well as a large destructive mass filling the inferior pubic left pubic ramus measuring 3.5 cm Patient evaluated by orthopedic surgery on 5/28, supportive care recommended, their input is appreciated.

## 2024-05-27 NOTE — Assessment & Plan Note (Signed)
 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 Lee Public Patient has been on letrozole  since April 2023, continuing Significant metastatic disease of the spine and pelvis, oncology to coordinate with radiation oncology for potential radiation therapy

## 2024-05-27 NOTE — Assessment & Plan Note (Signed)
 Patient continuing to have substantial back pain due to lytic lesions of the spine resulting in destructive mass identified in the right pedicle of L1, on the right pedicle of the L4 vertebral body as well as the L2 vertebral body Continue low-dose dexamethasone  to assist with inflammation and discomfort Opiate-based analgesics for pain control Offered IR consultation for possible kyphoplasty, patient has declined Dr. Lorri Rota with radiation oncology consulted who plans on initiating radiation therapy 6/2  Eventual plan for skilled nursing facility placement this week once bed available and authorization obtained.

## 2024-05-27 NOTE — Progress Notes (Signed)
 PROGRESS NOTE   Angela Lin  GNF:[id number] DOB: 1961/01/12 DOA: 05/22/2024 PCP: Helyn Lobstein, MD   Date of Service: the patient was seen and examined on 05/27/2024  Brief Narrative:  63 y.o. 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  Patient follows with Dr. Gudena. Patient had outpatient labs performed revealing severe hypercalcemia patient was instructed to come to the [facility] Emergency Department for evaluation.  Upon evaluation in the emergency department, CT imaging of the chest abdomen and pelvis was performed revealing a right upper lobe mass measuring 5.8 x 5.7 cm in addition to diffuse bony metastatic disease throughout the abdomen and pelvis with a destructive soft tissue mass in the right L1 pedicle causing significant spinal canal and neuroforaminal narrowing.  Case was discussed with Dr. Larrie Po of neurosurgery who recommended oncology and radiation oncology consultations for potential management.  Patient was initiated on intravenous fluids for the severe hypercalcemia and the hospitalist group was then called to assess the patient for admission to the hospital.  Patient was continuing aggressive intravenous volume resuscitation and was additionally provided with zoledronate and calcitonin.  Dr. Lee Public with oncology was consulted and formally followed along in consultation.  Considering the right upper lobe mass, Dr. Bertrum Brodie with PCCM was additionally consulted for recommendations on approach for biopsy.  EBUS was performed on 5/29 with biopsies obtained.  Concerning the patient's pathologic fractures of L1, L2 and L4, Dr. Lorri Rota with radiation oncology was also consulted with plans to initiate radiation therapy on 6/2.  Considering the patient's guarded prognosis  throughout the hospitalization multiple goals of care were undertaken and therefore Dr. Alejos Husband with palliative care was additionally consulted for assistance with having these conversations with the patient and family.   Assessment & Plan Hypercalcemia of malignancy Severe hypercalcemia of malignancy, improving Thought to be secondary to either progressive metastatic breast cancer or right upper lobe mass concerning for primary lung malignancy. Calcitonin was discontinued on 5/29 Zometa  4 mg given on 5/27 Transitioning off of intravenous volume resuscitation on 5/31 Serial chemistries to monitor calcium  levels closely Mass of upper lobe of right lung Patient underwent bronchoscopy with EBUS 5/29 with biopsies obtained, awaiting results Results may not be available prior to discharge. seen on CT imaging measuring 5.8 x 5.7 cm  concern for primary lung malignancy PCCM following daily, their input is appreciated.   AKI (acute kidney injury) (HCC) Creatinine seems to have now stabilized. substantial acute kidney injury secondary to volume depletion and hypercalcemia  Patient has been transitioned off of intravenous fluids. Strict input and output monitoring Minimizing use of nephrotoxic agents Pathologic fracture of lumbar vertebra, initial encounter Patient continuing to have substantial back pain due to lytic lesions of the spine resulting in destructive mass identified in the right pedicle of L1, on the right pedicle of the L4 vertebral body as well as the L2 vertebral body Continue low-dose dexamethasone  to assist with inflammation and discomfort Opiate-based analgesics for pain control Offered IR consultation for possible kyphoplasty, patient has declined Dr. Lorri Rota with radiation oncology consulted who plans on initiating radiation therapy 6/2  Eventual plan for skilled nursing facility placement this week once bed available and authorization obtained. Pathological fracture, pelvis,  initial encounter for fracture CT imaging revealing fracture of the left superior pubic ramus at the pubic acetabular junction as well as a large destructive mass filling the inferior pubic  left pubic ramus measuring 3.5 cm Patient evaluated by orthopedic surgery on 5/28, supportive care recommended, their input is appreciated. 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 Lee Public Patient has been on letrozole  since April 2023, continuing Significant metastatic disease of the spine and pelvis, oncology to coordinate with radiation oncology for potential radiation therapy Essential hypertension Continue amlodipine , metoprolol  Blood pressures well-controlled As needed intravenous hydralazine  for markedly elevated blood pressures. Hypokalemia Recurrent Replacing with potassium chloride  Evaluating for concurrent hypomagnesemia  Monitoring potassium levels with serial chemistries.  Goals of care, counseling/discussion Visit and goals of care discussions with patient and family daily Patient wishes to be DNR/DNI at this time Prognosis guarded/poor Patient is strongly considering hospice services Dr. Alejos Husband with Palliative care consulted to assist with facilitating these conversations, their assistance is appreciated. Hypophosphatemia Phos 1.7 K-Phos x 3 doses initiated.   Subjective:  Patient reports slowly improving back pain that is moderate in intensity, nonradiating, worse with weightbearing or ambulation, associated with difficulty with balance.   Patient denies any associated incontinence.  Physical Exam:  Vitals:   05/26/24 2041 05/27/24 0454 05/27/24 1223 05/27/24 1958  BP: 129/65 125/71 118/74 128/71  Pulse: 74 68 79 79  Resp: 18 18 17    Temp: 98 F (36.7 C) 98 F (36.7 C) 98.4 F (36.9 C) 97.7 F (36.5 C)  TempSrc:  Oral  Oral Oral  SpO2: 98% 99% 99% 99%  Weight:      Height:        Constitutional: Awake alert and oriented x 3, no associated distress.   Skin: no rashes, no lesions, good skin turgor noted. Eyes: Pupils are equally reactive to light.  No evidence of scleral icterus or conjunctival pallor.  ENMT: Moist mucous membranes noted.  Posterior pharynx clear of any exudate or lesions.   Respiratory: clear to auscultation bilaterally, no wheezing, no crackles. Normal respiratory effort. No accessory muscle use.  Cardiovascular: Regular rate and rhythm, no murmurs / rubs / gallops. No extremity edema. 2+ pedal pulses. No carotid bruits.  Abdomen: Abdomen is soft and nontender.  No evidence of intra-abdominal masses.  Positive bowel sounds noted in all quadrants.   Musculoskeletal: No joint deformity upper and lower extremities. Good ROM, no contractures. Normal muscle tone.    Data Reviewed:  I have personally reviewed and interpreted labs, imaging.  Significant findings are   CBC: Recent Labs  Lab 05/22/24 1736 05/23/24 0026 05/24/24 0405 05/25/24 0359 05/26/24 0425 05/27/24 0343  WBC 9.0 6.6 7.0 10.3 14.8* 14.4*  NEUTROABS 6.6  --  5.9 9.5* 13.4* 12.5*  HGB 12.8 12.6 10.8* 10.8* 10.2* 10.6*  HCT 37.5 38.2 32.6* 32.1* 30.9* 31.7*  MCV 85.2 89.0 88.6 86.5 88.0 86.1  PLT 351 311 275 261 304 305   Basic Metabolic Panel: Recent Labs  Lab 05/23/24 1738 05/24/24 0405 05/25/24 0359 05/26/24 0425 05/27/24 0343  NA 134* 133* 135 135 136  K 3.2* 3.1* 3.5 3.5 3.2*  CL 102 104 106 108 106  CO2 24 21* 21* 21* 20*  GLUCOSE 96 92 119* 119* 125*  BUN 26* 22 23 23 22   CREATININE 2.14* 1.73* 1.82* 1.41* 1.48*  CALCIUM  13.1* 12.0* 11.0* 9.5 9.2  MG  --   --  1.2* 2.2  --   PHOS  --   --   --   --  1.7*  GFR: Estimated Creatinine Clearance: 35.5 mL/min (A) (by C-G formula based on SCr of 1.48 mg/dL (H)). Liver Function Tests: Recent Labs  Lab 05/22/24 1736 05/23/24 0026 05/24/24 0405  05/25/24 0359 05/26/24 0425 05/27/24 0343  AST 21 18 17 17 16   --   ALT 27 24 17 19 19   --   ALKPHOS 151* 140* 117 122 119  --   BILITOT 0.8 0.4 0.6 0.6 0.3  --   PROT 7.5 6.9 6.0* 6.3* 6.0*  --   ALBUMIN 3.3* 3.0* 2.7* 2.5* 2.6* 2.8*    Coagulation Profile: Recent Labs  Lab 05/24/24 0405  INR 1.1      Code Status:  DNR.  Code status decision has been confirmed with: patient Family Communication: Mother updated on plan of care 5/30    Severity of Illness:  The appropriate patient status for this patient is INPATIENT. Inpatient status is judged to be reasonable and necessary in order to provide the required intensity of service to ensure the patient's safety. The patient's presenting symptoms, physical exam findings, and initial radiographic and laboratory data in the context of their chronic comorbidities is felt to place them at high risk for further clinical deterioration. Furthermore, it is not anticipated that the patient will be medically stable for discharge from the hospital within 2 midnights of admission.   * I certify that at the point of admission it is my clinical judgment that the patient will require inpatient hospital care spanning beyond 2 midnights from the point of admission due to high intensity of service, high risk for further deterioration and high frequency of surveillance required.*  Time spent:  38 minutes  Author:  True Fuss MD

## 2024-05-27 NOTE — Assessment & Plan Note (Signed)
 Replaced

## 2024-05-27 NOTE — Progress Notes (Signed)
 Mobility Specialist - Progress Note   05/27/24 1050  Mobility  Activity Ambulated with assistance to bathroom;Ambulated with assistance in hallway  Level of Assistance Contact guard assist, steadying assist  Assistive Device Front wheel walker  Distance Ambulated (ft) 150 ft  Range of Motion/Exercises Active  Activity Response Tolerated well  Mobility Referral Yes  Mobility visit 1 Mobility  Mobility Specialist Start Time (ACUTE ONLY) 1035  Mobility Specialist Stop Time (ACUTE ONLY) 1050  Mobility Specialist Time Calculation (min) (ACUTE ONLY) 15 min   Pt was found in bed and agreeable to ambulate. At EOS c/o R hip pain and requested pain medication. RN notified. Pt was left in bed with all needs met. Call bell in reach.  Lorna Rose,  Mobility Specialist Can be reached via Secure Chat

## 2024-05-27 NOTE — Assessment & Plan Note (Signed)
 Continue amlodipine , metoprolol  Blood pressures well-controlled As needed intravenous hydralazine for markedly elevated blood pressures.

## 2024-05-27 NOTE — Assessment & Plan Note (Signed)
 Visit and goals of care discussions with patient and family daily Patient wishes to be DNR/DNI at this time Prognosis guarded/poor Patient is strongly considering hospice services Dr. Alejos Husband with Palliative care consulted to assist with facilitating these conversations, their assistance is appreciated.

## 2024-05-27 NOTE — Progress Notes (Signed)
 Daily Progress Note   Patient Name: Angela Lin       Date: 05/27/2024 DOB: [birth year]  Age: 63 y.o. MRN#: [id number] Attending Physician: True Fuss, MD Primary Care Physician: Helyn Lobstein, MD Admit Date: 05/22/2024  Reason for Consultation/Follow-up: Establishing goals of care  Subjective: Patient is awake alert, in good spirits, able to tolerate p.o.  States that pain control with current opioids is appropriate, does not want to get started on long-acting opioids just yet.  Mother and brother present at bedside.  Goals of care family meeting took place, see below medication history reviewed with the patient.  Length of Stay: 5  Current Medications: Scheduled Meds:   amLODipine   5 mg Oral Daily   atorvastatin   10 mg Oral Daily   dexamethasone   2 mg Oral BID   feeding supplement  237 mL Oral BID BM   letrozole   2.5 mg Oral Daily   metoprolol  succinate  150 mg Oral Daily   nystatin   5 mL Oral QID   oxyCODONE   5 mg Oral BID   senna  2 tablet Oral QHS    Continuous Infusions:   PRN Meds: hydrALAZINE , oxyCODONE  **OR** HYDROmorphone  (DILAUDID ) injection, melatonin, polyethylene glycol  Physical Exam         Awake alert resting in bed No acute distress Regular work of breathing Abdomen is not distended Mood and affect within normal limits  Vital Signs: BP 118/74 (BP Location: Right Arm)   Pulse 79   Temp 98.4 F (36.9 C) (Oral)   Resp 17   Ht 5\' 5"  (1.651 m)   Wt 68 kg   LMP 08/27/2008 (Approximate)   SpO2 99%   BMI 24.95 kg/m  SpO2: SpO2: 99 % O2 Device: O2 Device: Room Air O2 Flow Rate: O2 Flow Rate (L/min): 1 L/min  Intake/output summary:  Intake/Output Summary (Last 24 hours) at 05/27/2024 1317 Last data filed at 05/27/2024 0840 Gross per 24  hour  Intake 440 ml  Output --  Net 440 ml   LBM: Last BM Date :  (pt unable to recall) Baseline Weight: Weight: 68 kg Most recent weight: Weight: 68 kg       Palliative Assessment/Data:      Patient Active Problem List   Diagnosis Date Noted   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) 05/25/2024   Pathologic fracture of lumbar vertebra, initial encounter 05/25/2024   Goals of care, counseling/discussion 05/25/2024   Mass of upper lobe of right lung 05/23/2024   Pathological fracture, pelvis, initial encounter for fracture 05/23/2024   Hypercalcemia of malignancy 05/22/2024   AKI (acute kidney injury) (HCC) 05/22/2024   Hypokalemia 05/22/2024   Essential hypertension 05/16/2023   Elevated LDL cholesterol level 05/16/2023   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) 06/23/2022   Other malignant neuroendocrine tumors (HCC) 03/31/2022   Unilateral primary osteoarthritis, left hip 12/08/2018   Status post total replacement of left hip 12/08/2018   Osteoarthritis of right hip 06/04/2016   Avascular necrosis of bones of both hips (HCC) 06/04/2016   Status post total replacement of right hip 06/04/2016    Palliative Care Assessment & Plan   Patient Profile:   63 year old lady with a history of breast cancer, admitted with new onset of low back pain, Sciatica ,found to have metastatic lesion T7 spinal compression fracture, subacute fracture of left superior rami and destructive lesion left inferior rami with metastases, acute kidney injury right upper lobe mass status post EBUS/navigational bronchoscopy, cytology path results pending.  Patient has pain from pathological fracture of lumbar vertebra and pelvis. Patient has past medical history of hypertension dyslipidemia. Patient remains admitted to hospital medicine service Oncology and pulmonology following Radiation oncology also consulted Palliative consult for ongoing goals of care discussions has  been requested. Assessment: Hypercalcemia of malignancy Mass in upper lobe of right lung Pathologic fracture lumbar vertebra Uncontrolled pain Functional decline necessitating PT  Recommendations/Plan:   Goals of care discussions undertaken with the patient, mother and brother also present at bedside.  We reviewed her current hospital course thus far and acknowledged that we are still waiting on some more results in her future plan of care.  We talked about a probable disposition being skilled nursing facility for rehabilitation.  We discussed about addition of palliative services at the SNF for rehab and patient is in agreement.  We also discussed about patient establishing with palliative care consult cancer Center with my colleague Ms. Cousar, NP for ongoing pain and non-- pain symptom management as well as for additional support and for ongoing goals of care discussions. Patient aware that she will be initiating radiation therapy from 6/2 onwards.  Patient aware that bronchoscopy biopsy results are pending. Offered active listening and supportive presence.  Discussed about current opioid use and current pain management.  Continue current mode of care, palliative to continue to follow along.   Code Status:    Code Status Orders  (From admission, onward)           Start     Ordered   05/25/24 1309  Do not attempt resuscitation (DNR)- Limited -Do Not Intubate (DNI)  (Code Status)  Continuous       Question Answer Comment  If pulseless and not breathing No CPR or chest compressions.   In Pre-Arrest Conditions (Patient Is Breathing and Has A Pulse) Do not intubate. Provide all appropriate non-invasive medical interventions. Avoid ICU transfer unless indicated or required.   Consent: Discussion documented in EHR or advanced directives reviewed      05/25/24 1308           Code Status History     Date Active Date Inactive Code Status Order ID Comments User Context   05/22/2024  2238 05/25/2024 1308 Full Code [id number]  [name], MD ED  12/08/2018 1808 12/09/2018 1611 Full Code [id number]  Arnie Lao, MD Inpatient       Prognosis:  Unable to determine  Discharge Planning: Skilled Nursing Facility for rehab with Palliative care service follow-up  Care plan was discussed with patient, mother, brother  Thank you for allowing the Palliative Medicine Team to assist in the care of this patient.   MOD MDM     Greater than 50%  of this time was spent counseling and coordinating care related to the above assessment and plan.  Lujean Sake, MD  Please contact Palliative Medicine Team phone at [phone] for questions and concerns.

## 2024-05-27 NOTE — Assessment & Plan Note (Signed)
 Patient underwent bronchoscopy with EBUS 5/29 with biopsies obtained Will review results of biopsies with oncology once available  seen on CT imaging measuring 5.8 x 5.7 cm  concern for primary lung malignancy PCCM following daily, their input is appreciated.

## 2024-05-27 NOTE — Assessment & Plan Note (Signed)
 Creatinine seems to have now stabilized. substantial acute kidney injury secondary to volume depletion and hypercalcemia  Patient has been transitioned off of intravenous fluids. Strict input and output monitoring Minimizing use of nephrotoxic agents

## 2024-05-27 NOTE — Assessment & Plan Note (Signed)
 Severe hypercalcemia of malignancy, improving Thought to be secondary to either progressive metastatic breast cancer or right upper lobe mass concerning for primary lung malignancy. Calcium  has now dropped to 9.5 Calcitonin was discontinued on 5/29 Zometa  4 mg given on 5/27 Transitioning off of intravenous volume resuscitation with normal saline today Serial chemistries to monitor calcium  levels closely

## 2024-05-28 ENCOUNTER — Other Ambulatory Visit: Payer: Self-pay

## 2024-05-28 ENCOUNTER — Ambulatory Visit
Admission: RE | Admit: 2024-05-28 | Discharge: 2024-05-28 | Disposition: A | Discharge status: Home / Self Care | Source: Ambulatory Visit | Attending: Radiation Oncology | Admitting: Radiation Oncology

## 2024-05-28 LAB — CBC WITH DIFFERENTIAL/PLATELET
Abs Immature Granulocytes: 0.58 10*3/uL — ABNORMAL HIGH (ref 0.00–0.07)
Basophils Absolute: 0 10*3/uL (ref 0.0–0.1)
Basophils Relative: 0 %
Eosinophils Absolute: 0 10*3/uL (ref 0.0–0.5)
Eosinophils Relative: 0 %
HCT: 31.7 % — ABNORMAL LOW (ref 36.0–46.0)
Hemoglobin: 10.8 g/dL — ABNORMAL LOW (ref 12.0–15.0)
Immature Granulocytes: 4 %
Lymphocytes Relative: 4 %
Lymphs Abs: 0.6 10*3/uL — ABNORMAL LOW (ref 0.7–4.0)
MCH: 29.6 pg (ref 26.0–34.0)
MCHC: 34.1 g/dL (ref 30.0–36.0)
MCV: 86.8 fL (ref 80.0–100.0)
Monocytes Absolute: 1.3 10*3/uL — ABNORMAL HIGH (ref 0.1–1.0)
Monocytes Relative: 9 %
Neutro Abs: 12.6 10*3/uL — ABNORMAL HIGH (ref 1.7–7.7)
Neutrophils Relative %: 83 %
Platelets: 277 10*3/uL (ref 150–400)
RBC: 3.65 MIL/uL — ABNORMAL LOW (ref 3.87–5.11)
RDW: 12.4 % (ref 11.5–15.5)
WBC: 15.2 10*3/uL — ABNORMAL HIGH (ref 4.0–10.5)
nRBC: 0 % (ref 0.0–0.2)

## 2024-05-28 LAB — RAD ONC ARIA SESSION SUMMARY

## 2024-05-28 LAB — COMPREHENSIVE METABOLIC PANEL WITH GFR
ALT: 20 U/L (ref 0–44)
AST: 15 U/L (ref 15–41)
Albumin: 2.8 g/dL — ABNORMAL LOW (ref 3.5–5.0)
Alkaline Phosphatase: 128 U/L — ABNORMAL HIGH (ref 38–126)
Anion gap: 8 (ref 5–15)
BUN: 20 mg/dL (ref 8–23)
CO2: 21 mmol/L — ABNORMAL LOW (ref 22–32)
Calcium: 8.5 mg/dL — ABNORMAL LOW (ref 8.9–10.3)
Chloride: 104 mmol/L (ref 98–111)
Creatinine, Ser: 1.15 mg/dL — ABNORMAL HIGH (ref 0.44–1.00)
GFR, Estimated: 54 mL/min — ABNORMAL LOW (ref >60)
Glucose, Bld: 119 mg/dL — ABNORMAL HIGH (ref 70–99)
Potassium: 3.3 mmol/L — ABNORMAL LOW (ref 3.5–5.1)
Sodium: 133 mmol/L — ABNORMAL LOW (ref 135–145)
Total Bilirubin: 0.6 mg/dL (ref 0.0–1.2)
Total Protein: 6.4 g/dL — ABNORMAL LOW (ref 6.5–8.1)

## 2024-05-28 LAB — MAGNESIUM: Magnesium: 1.8 mg/dL (ref 1.7–2.4)

## 2024-05-28 MED ORDER — POTASSIUM CHLORIDE CRYS ER 20 MEQ PO TBCR
40.0000 meq | EXTENDED_RELEASE_TABLET | ORAL | Status: AC
  Administered 2024-05-28 (×2): 40 meq via ORAL
  Filled 2024-05-28 (×2): qty 2

## 2024-05-28 MED ORDER — K PHOS MONO-SOD PHOS DI & MONO 155-852-130 MG PO TABS
500.0000 mg | ORAL_TABLET | Freq: Three times a day (TID) | ORAL | Status: AC
  Administered 2024-05-28 (×3): 500 mg via ORAL
  Filled 2024-05-28 (×3): qty 2

## 2024-05-28 MED ORDER — ACETAMINOPHEN 500 MG PO TABS: 1000.0000 mg | ORAL_TABLET | Freq: Once | ORAL | Status: DC

## 2024-05-28 NOTE — Progress Notes (Signed)
 PROGRESS NOTE    Angela Lin  GMW:[id number] DOB: 1961-01-24 DOA: 05/22/2024 PCP: Helyn Lobstein, MD  Chief Complaint  Patient presents with   Hydration   Abnormal Labs    Brief Narrative:   63 y.o. 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  Patient follows with Dr. Gudena. Patient had outpatient labs performed revealing severe hypercalcemia patient was instructed to come to the [facility] Emergency Department for evaluation.   Upon evaluation in the emergency department, CT imaging of the chest abdomen and pelvis was performed revealing Draylon Mercadel right upper lobe mass measuring 5.8 x 5.7 cm in addition to diffuse bony metastatic disease throughout the abdomen and pelvis with Sharlon Pfohl destructive soft tissue mass in the right L1 pedicle causing significant spinal canal and neuroforaminal narrowing.  Case was discussed with Dr. Larrie Po of neurosurgery who recommended oncology and radiation oncology consultations for potential management.  Patient was initiated on intravenous fluids for the severe hypercalcemia and the hospitalist group was then called to assess the patient for admission to the hospital.   Patient was continuing aggressive intravenous volume resuscitation and was additionally provided with zoledronate and calcitonin.  Case was discussed with Dr. Gudena with oncology who would formally evaluate patient in consultation.   Considering the right upper lobe mass, Dr. Bertrum Brodie with PCCM was additionally consulted for recommendations on approach for biopsy.  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 Lee Public outpatient Radiation onc planning for radiation to T7, L1, and right rib metastases (first treatment 6/2) Had MRI C/T/L spine, canceled, will follow rationale (per discussion with provider, related to AKI - will discuss whether we should get MRI here - note recommended thoracic spine MRI to assess spinal canal patency - also, destructive soft tissue mass in R L1 pedicle causing significant spinal canal and neuroforaminal narrowing)  Hx Breast Cancer with Lung and Bone Mets Follows with Dr. Lee Public Workup for malignancy as noted above  AKI Resolved  Pathologic Pubic Rami Fractures WBAT Non op management per orth (5/28 note from Dorthy Gavia)  Hypertenion Amlodipine , metoprolol    Leukocytosis Related to steroids    DVT prophylaxis: SCD Code Status: DNR Family Communication: mother at bedside Disposition:   Status is: Inpatient Remains inpatient appropriate because: awaiting SNF placement   Consultants:  Oncology Rad onc  Pulmonary   Procedures:   5/29 Flexible video fiberoptic bronchoscopy with robotic assistance and biopsies.    Antimicrobials:  Anti-infectives (From admission, onward)    None       Subjective: No complaints Pain is ok  Objective: Vitals:   05/27/24 1958 05/28/24 0432 05/28/24 0938 05/28/24 1227  BP: 128/71 131/80 116/61 120/76  Pulse: 79 74 78 71  Resp:    16  Temp: 97.7 F (36.5 C) 98.2 F  (36.8 C)  99.4 F (37.4 C)  TempSrc: Oral Oral  Oral  SpO2: 99% 100%  97%  Weight:      Height:        Intake/Output Summary (Last 24 hours) at 05/28/2024 1550 Last data filed at 05/28/2024 1200 Gross per 24 hour  Intake 940 ml  Output --  Net 940 ml   Filed Weights   05/22/24 2300  Weight: 68 kg    Examination:  General exam: Appears calm and comfortable  Respiratory system: unlabored Cardiovascular system: RRR Gastrointestinal system: Abdomen is nondistended, soft and nontender Central nervous system: Alert and oriented. No focal neurological deficits. Extremities: no LEE    Data Reviewed: I have personally reviewed following labs and imaging studies  CBC: Recent Labs  Lab 05/24/24 0405 05/25/24 0359 05/26/24 0425 05/27/24 0343 05/28/24 0346  WBC 7.0 10.3 14.8* 14.4* 15.2*  NEUTROABS 5.9 9.5* 13.4* 12.5* 12.6*  HGB 10.8* 10.8* 10.2* 10.6* 10.8*  HCT 32.6* 32.1* 30.9* 31.7* 31.7*  MCV 88.6 86.5 88.0 86.1 86.8  PLT 275 261 304 305 277    Basic Metabolic Panel: Recent Labs  Lab 05/24/24 0405 05/25/24 0359 05/26/24 0425 05/27/24 0343 05/28/24 0346  NA 133* 135 135 136 133*  K 3.1* 3.5 3.5 3.2* 3.3*  CL 104 106 108 106 104  CO2 21* 21* 21* 20* 21*  GLUCOSE 92 119* 119* 125* 119*  BUN 22 23 23 22 20   CREATININE 1.73* 1.82* 1.41* 1.48* 1.15*  CALCIUM  12.0* 11.0* 9.5 9.2 8.5*  MG  --  1.2* 2.2  --  1.8  PHOS  --   --   --  1.7*  --     GFR: Estimated Creatinine Clearance: 45.6 mL/min ([name]) (by C-G formula based on SCr of 1.15 mg/dL (H)).  Liver Function Tests: Recent Labs  Lab 05/23/24 0026 05/24/24 0405 05/25/24 0359 05/26/24 0425 05/27/24 0343 05/28/24 0346  AST 18 17 17 16   --  15  ALT 24 17 19 19   --  20  ALKPHOS 140* 117 122 119  --  128*  BILITOT 0.4 0.6 0.6 0.3  --  0.6  PROT 6.9 6.0* 6.3* 6.0*  --  6.4*  ALBUMIN 3.0* 2.7* 2.5* 2.6* 2.8* 2.8*    CBG: No results for input(s): "GLUCAP" in the last 168 hours.   No results found for  this or any previous visit (from the past 240 hours).       Radiology Studies: No results found.      Scheduled Meds:  amLODipine   5 mg Oral Daily   atorvastatin   10 mg Oral Daily   dexamethasone   2 mg Oral BID   feeding supplement  237 mL Oral BID BM   letrozole   2.5 mg Oral Daily   metoprolol  succinate  150 mg Oral Daily   nystatin   5 mL Oral QID   oxyCODONE   5 mg Oral BID   phosphorus  500 mg Oral TID   potassium chloride   40 mEq Oral Q4H   senna  2 tablet Oral QHS   Continuous Infusions:   LOS: 6 days    Time spent: over 30 min    Donnetta Gains, MD [facility]   To contact the attending provider between 7A-7P or the covering provider during after hours 7P-7A, please log into the web site [email or web address] and access using universal [place] password for that web site.  If you do not have the password, please call the hospital operator.  05/28/2024, 3:50 PM

## 2024-05-28 NOTE — Progress Notes (Signed)
 Physical Therapy Treatment Patient Details Name: Angela Lin MRN: [id number] DOB: [birth year] Today's Date: 05/28/2024   History of Present Illness 63 yo female admitted with abnormal labs-hypercalcemia, new R lung mass. Imaging showed multifocal bone mets throughout the ribs, spine and scapula, pathologic fracture of T7, lateral lower left ribs, inferior right scapula, L pubic ramus fx. Hx of neuroendocrine Ca-lumpectomy, met breast Ca.    PT Comments  Pt agreeable to mobilizing. She walked to bathroom and around room. Deferred hallway ambulation this session, at pt's request, 2* pain rated 8/10 L lower back/hip area. Assisted pt into recliner to sit for a bit-NT in room with pt upon my exit. Pt reports she goes for radiation tx later today. Will continue to follow during hospital stay. Pt and family are requesting SNF placement.     If plan is discharge home, recommend the following: A little help with walking and/or transfers;A little help with bathing/dressing/bathroom;Assistance with cooking/housework;Assist for transportation;Help with stairs or ramp for entrance   Can travel by private vehicle        Equipment Recommendations  Rolling walker (2 wheels)    Recommendations for Other Services       Precautions / Restrictions Precautions Precautions: Fall Restrictions Weight Bearing Restrictions Per Provider Order: No     Mobility  Bed Mobility Overal bed mobility: Modified Independent                  Transfers Overall transfer level: Needs assistance Equipment used: Rolling walker (2 wheels) Transfers: Sit to/from Stand Sit to Stand: Supervision           General transfer comment: cues for hand placement.    Ambulation/Gait Ambulation/Gait assistance: Supervision Gait Distance (Feet): 20 Feet Assistive device: Rolling walker (2 wheels) Gait Pattern/deviations: Step-through pattern, Decreased stride length       General Gait Details: No LOB  with RW use. Pt denied dizziness. Limited distance on today 2* pain.   Stairs             Wheelchair Mobility     Tilt Bed    Modified Rankin (Stroke Patients Only)       Balance                                            Communication Communication Communication: No apparent difficulties  Cognition Arousal: Alert Behavior During Therapy: WFL for tasks assessed/performed   PT - Cognitive impairments: No apparent impairments                         Following commands: Intact      Cueing Cueing Techniques: Verbal cues  Exercises      General Comments        Pertinent Vitals/Pain Pain Assessment Pain Assessment: 0-10 Pain Score: 8  Pain Location: L lower back/hip Pain Descriptors / Indicators: Sharp, Aching Pain Intervention(s): Limited activity within patient's tolerance, Monitored during session, Repositioned    Home Living                          Prior Function            PT Goals (current goals can now be found in the care plan section) Progress towards PT goals: Progressing toward goals    Frequency    Min 3X/week  PT Plan      Co-evaluation              AM-PAC PT "6 Clicks" Mobility   Outcome Measure  Help needed turning from your back to your side while in a flat bed without using bedrails?: None Help needed moving from lying on your back to sitting on the side of a flat bed without using bedrails?: None Help needed moving to and from a bed to a chair (including a wheelchair)?: None Help needed standing up from a chair using your arms (e.g., wheelchair or bedside chair)?: None Help needed to walk in hospital room?: A Little Help needed climbing 3-5 steps with a railing? : A Little 6 Click Score: 22    End of Session   Activity Tolerance: Patient tolerated treatment well;Patient limited by pain Patient left: in chair;with call bell/phone within reach;with chair alarm set   PT  Visit Diagnosis: Muscle weakness (generalized) (M62.81);Pain;Difficulty in walking, not elsewhere classified (R26.2)     Time: 1337-1410 PT Time Calculation (min) (ACUTE ONLY): 33 min  Charges:    $Gait Training: 8-22 mins $Therapeutic Activity: 8-22 mins PT General Charges $$ ACUTE PT VISIT: 1 Visit                         Tanda Falter, PT Acute Rehabilitation  Office: [phone]

## 2024-05-28 NOTE — Progress Notes (Signed)
 Mobility Specialist - Progress Note   05/28/24 1600  Mobility  Activity Ambulated with assistance to bathroom  Level of Assistance Standby assist, set-up cues, supervision of patient - no hands on  Assistive Device Front wheel walker  Distance Ambulated (ft) 20 ft  Range of Motion/Exercises Active  Activity Response Tolerated well  Mobility Referral Yes  Mobility visit 1 Mobility  Mobility Specialist Start Time (ACUTE ONLY) 1550  Mobility Specialist Stop Time (ACUTE ONLY) 1600  Mobility Specialist Time Calculation (min) (ACUTE ONLY) 10 min   Pt requested assistance to bathroom. Returned to bed with all needs met. Call bell in reach.  Lorna Rose,  Mobility Specialist Can be reached via Secure Chat

## 2024-05-28 NOTE — TOC Progression Note (Addendum)
 Transition of Care [facility]) - Progression Note    Patient Details  Name: Angela Lin MRN: [id number] Date of Birth: Oct 19, 1961  [facility] CM/SW Contact  Gertha Ku, LCSW Phone Number: 05/28/2024, 9:45 AM  Clinical Narrative:     Pt has no bed offers at this time. TOC to follow.   Adden  11:00am CSW met with the pt and the pt's mother at the bedside to discuss recommendations for SNF placement. CSW informed the pt of the barriers to placement. The pt reports she is waiting to hear back from the MD regarding her diagnosis and to explore treatment options. Pt did not want to expand the bed search outside of the [facility] area. CSW will reach out to the pending facility for review. TOC to follow.    Expected Discharge Plan and Services                                               Social Determinants of Health (SDOH) Interventions SDOH Screenings   Food Insecurity: No Food Insecurity (05/22/2024)  Housing: Low Risk  (05/22/2024)  Transportation Needs: No Transportation Needs (05/22/2024)  Utilities: Not At Risk (05/22/2024)  Tobacco Use: High Risk (05/24/2024)    Readmission Risk Interventions    05/26/2024    4:01 PM  Readmission Risk Prevention Plan  Post Dischage Appt Complete  Medication Screening Complete  Transportation Screening Complete

## 2024-05-28 NOTE — Progress Notes (Signed)
 Daily Progress Note   Patient Name: Angela Lin       Date: 05/28/2024 DOB: 12/01/1961  Age: 63 y.o. MRN#: [id number] Attending Physician: Etter Hermann., * Primary Care Physician: Helyn Lobstein, MD Admit Date: 05/22/2024  Reason for Consultation/Follow-up: Establishing goals of care  Subjective: Patient resting in bed, chart reviewed.  Biopsy still pending Medication regimen noted, 5 doses of 5 mg each of PO Oxy IR in the past 24 hours.  TOC note also reviewed.   Length of Stay: 6  Current Medications: Scheduled Meds:   amLODipine   5 mg Oral Daily   atorvastatin   10 mg Oral Daily   dexamethasone   2 mg Oral BID   feeding supplement  237 mL Oral BID BM   letrozole   2.5 mg Oral Daily   metoprolol  succinate  150 mg Oral Daily   nystatin   5 mL Oral QID   oxyCODONE   5 mg Oral BID   phosphorus  500 mg Oral TID   potassium chloride   40 mEq Oral Q4H   senna  2 tablet Oral QHS    Continuous Infusions:   PRN Meds: hydrALAZINE , oxyCODONE  **OR** HYDROmorphone  (DILAUDID ) injection, melatonin, polyethylene glycol  Physical Exam         Awake alert resting in bed No acute distress    Vital Signs: BP 120/76 (BP Location: Right Arm)   Pulse 71   Temp 99.4 F (37.4 C) (Oral)   Resp 16   Ht 5\' 5"  (1.651 m)   Wt 68 kg   LMP 08/27/2008 (Approximate)   SpO2 97%   BMI 24.95 kg/m  SpO2: SpO2: 97 % O2 Device: O2 Device: Room Air O2 Flow Rate: O2 Flow Rate (L/min): 1 L/min  Intake/output summary:  Intake/Output Summary (Last 24 hours) at 05/28/2024 1425 Last data filed at 05/28/2024 4098 Gross per 24 hour  Intake 990 ml  Output --  Net 990 ml   LBM: Last BM Date :  (pt unable to recall) Baseline Weight: Weight: 68 kg Most recent weight: Weight: 68 kg        Palliative Assessment/Data:      Patient Active Problem List   Diagnosis Date Noted   Hypophosphatemia 05/28/2024   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) 05/25/2024   Pathologic fracture of lumbar vertebra, initial  encounter 05/25/2024   Goals of care, counseling/discussion 05/25/2024   Mass of upper lobe of right lung 05/23/2024   Pathological fracture, pelvis, initial encounter for fracture 05/23/2024   Hypercalcemia of malignancy 05/22/2024   AKI (acute kidney injury) (HCC) 05/22/2024   Hypokalemia 05/22/2024   Essential hypertension 05/16/2023   Elevated LDL cholesterol level 05/16/2023   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) 06/23/2022   Other malignant neuroendocrine tumors (HCC) 03/31/2022   Unilateral primary osteoarthritis, left hip 12/08/2018   Status post total replacement of left hip 12/08/2018   Osteoarthritis of right hip 06/04/2016   Avascular necrosis of bones of both hips (HCC) 06/04/2016   Status post total replacement of right hip 06/04/2016    Palliative Care Assessment & Plan   Patient Profile:   63 year old lady with a history of breast cancer, admitted with new onset of low back pain, Sciatica ,found to have metastatic lesion T7 spinal compression fracture, subacute fracture of left superior rami and destructive lesion left inferior rami with metastases, acute kidney injury right upper lobe mass status post EBUS/navigational bronchoscopy, cytology path results pending.  Patient has pain from pathological fracture of lumbar vertebra and pelvis. Patient has past medical history of hypertension dyslipidemia. Patient remains admitted to hospital medicine service Oncology and pulmonology following Radiation oncology also consulted Palliative consult for ongoing goals of care discussions has been requested. Assessment: Hypercalcemia of malignancy Mass in upper lobe of right lung Pathologic fracture lumbar  vertebra Uncontrolled pain Functional decline necessitating PT  Recommendations/Plan: DNR SNF with rehab Await biopsy results, further oncology and rad onc recommendations.  Recommend palliative care at cancer center on discharge.        Code Status:    Code Status Orders  (From admission, onward)           Start     Ordered   05/25/24 1309  Do not attempt resuscitation (DNR)- Limited -Do Not Intubate (DNI)  (Code Status)  Continuous       Question Answer Comment  If pulseless and not breathing No CPR or chest compressions.   In Pre-Arrest Conditions (Patient Is Breathing and Has A Pulse) Do not intubate. Provide all appropriate non-invasive medical interventions. Avoid ICU transfer unless indicated or required.   Consent: Discussion documented in EHR or advanced directives reviewed      05/25/24 1308           Code Status History     Date Active Date Inactive Code Status Order ID Comments User Context   05/22/2024 2238 05/25/2024 1308 Full Code [id number]  [name], MD ED   12/08/2018 1808 12/09/2018 1611 Full Code [id number]  Arnie Lao, MD Inpatient       Prognosis:  Unable to determine  Discharge Planning: Skilled Nursing Facility for rehab with Palliative care service follow-up  Care plan was discussed with IDT  Thank you for allowing the Palliative Medicine Team to assist in the care of this patient.   low MDM     Greater than 50%  of this time was spent counseling and coordinating care related to the above assessment and plan.  Lujean Sake, MD  Please contact Palliative Medicine Team phone at [phone] for questions and concerns.

## 2024-05-28 NOTE — Progress Notes (Signed)
 HEMATOLOGY-ONCOLOGY PROGRESS NOTE  SUBJECTIVE: complains of back pain She had her first radiation treatment today because of that her back is hurting slightly more because they have to position her.  Pathology from the bronc biopsy is not back yet.  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 pT2, pN0   05/26/2022 Cancer Staging   Staging form: Breast, AJCC 8th Edition - Clinical stage from 05/26/2022: Stage IIA (cT2, cN1, cM0, G2, ER+, PR+, HER2-) - Signed by Percival Brace, NP on 01/11/2023 Stage prefix: Initial diagnosis Histologic grading system: 3 grade system   07/15/2022 - 08/11/2022 Radiation Therapy    07/15/2022 through 08/11/2022 Site Technique Total Dose (Gy) Dose per Fx (Gy) Completed Fx Beam Energies  Breast, Left: Breast_L 3D 42.56/42.56 2.66 16/16 10XFFF  Breast, Left: Breast_L_Bst 3D 8/8 2 4/4 6X, 10X     07/2022 -  Anti-estrogen oral therapy   Letrozole  daily     OBJECTIVE: REVIEW OF SYSTEMS:       PHYSICAL EXAMINATION: ECOG PERFORMANCE STATUS: 3 - Symptomatic, >50% confined to bed  Vitals:   05/28/24 0938 05/28/24 1227  BP: 116/61 120/76  Pulse: 78 71  Resp:  16  Temp:  99.4 F (37.4 C)  SpO2:  97%   Filed Weights   05/22/24 2300  Weight: 149 lb 14.6 oz (68 kg)       LABORATORY DATA:  I have reviewed the data as listed    Latest Ref Rng & Units 05/28/2024    3:46 AM 05/27/2024    3:43 AM 05/26/2024    4:25 AM  CMP  Glucose 70 - 99 mg/dL 409  811  914   BUN 8 - 23 mg/dL 20  22  23    Creatinine  0.44 - 1.00 mg/dL 7.82  9.56  2.13   Sodium 135 - 145 mmol/L 133  136  135   Potassium 3.5 - 5.1 mmol/L 3.3  3.2  3.5   Chloride 98 - 111 mmol/L 104  106  108   CO2 22 - 32 mmol/L 21  20  21    Calcium  8.9 - 10.3 mg/dL 8.5  9.2  9.5   Total Protein 6.5 - 8.1 g/dL 6.4   6.0   Total Bilirubin 0.0 - 1.2 mg/dL 0.6   0.3   Alkaline Phos 38 - 126 U/L 128   119   AST 15 - 41 U/L 15   16   ALT 0 - 44 U/L 20   19     Lab Results  Component Value Date   WBC 15.2 (H) 05/28/2024   HGB 10.8 (L) 05/28/2024   HCT 31.7 (L) 05/28/2024   MCV 86.8 05/28/2024   PLT 277 05/28/2024   NEUTROABS 12.6 (H) 05/28/2024    ASSESSMENT AND PLAN: 1.  Metastatic carcinoma: Lung, lymphadenopathy, bone metastases: Status post EBUS and biopsy.  Awaiting pathology report. 2. goals of care: Patient expressed to me that she is not willing to go through any extensive treatment options and most likely her preference is going to be to be kept  comfortable and to die with hospice care. 3.  Radiation to the back ongoing  I will be meeting with her family tomorrow at 4:40 PM to discuss pathology report and finalize treatment plan

## 2024-05-28 NOTE — Assessment & Plan Note (Signed)
 Phos 1.7 K-Phos x 3 doses initiated.

## 2024-05-29 ENCOUNTER — Ambulatory Visit

## 2024-05-29 DIAGNOSIS — Z515 Encounter for palliative care: Secondary | ICD-10-CM

## 2024-05-29 DIAGNOSIS — C799 Secondary malignant neoplasm of unspecified site: Secondary | ICD-10-CM

## 2024-05-29 DIAGNOSIS — R4589 Other symptoms and signs involving emotional state: Secondary | ICD-10-CM

## 2024-05-29 DIAGNOSIS — R918 Other nonspecific abnormal finding of lung field: Secondary | ICD-10-CM

## 2024-05-29 DIAGNOSIS — Z66 Do not resuscitate: Secondary | ICD-10-CM | POA: Diagnosis not present

## 2024-05-29 DIAGNOSIS — G893 Neoplasm related pain (acute) (chronic): Secondary | ICD-10-CM

## 2024-05-29 DIAGNOSIS — Z7189 Other specified counseling: Secondary | ICD-10-CM

## 2024-05-29 LAB — CBC
HCT: 31.6 % — ABNORMAL LOW (ref 36.0–46.0)
Hemoglobin: 10.9 g/dL — ABNORMAL LOW (ref 12.0–15.0)
MCH: 29.4 pg (ref 26.0–34.0)
MCHC: 34.5 g/dL (ref 30.0–36.0)
MCV: 85.2 fL (ref 80.0–100.0)
Platelets: 239 10*3/uL (ref 150–400)
RBC: 3.71 MIL/uL — ABNORMAL LOW (ref 3.87–5.11)
RDW: 12.5 % (ref 11.5–15.5)
WBC: 18.6 10*3/uL — ABNORMAL HIGH (ref 4.0–10.5)
nRBC: 0 % (ref 0.0–0.2)

## 2024-05-29 LAB — CYTOLOGY - NON PAP

## 2024-05-29 LAB — BASIC METABOLIC PANEL WITH GFR
Anion gap: 8 (ref 5–15)
BUN: 22 mg/dL (ref 8–23)
CO2: 21 mmol/L — ABNORMAL LOW (ref 22–32)
Calcium: 8.6 mg/dL — ABNORMAL LOW (ref 8.9–10.3)
Chloride: 104 mmol/L (ref 98–111)
Creatinine, Ser: 0.87 mg/dL (ref 0.44–1.00)
GFR, Estimated: >60 mL/min (ref >60)
Glucose, Bld: 122 mg/dL — ABNORMAL HIGH (ref 70–99)
Potassium: 3.5 mmol/L (ref 3.5–5.1)
Sodium: 133 mmol/L — ABNORMAL LOW (ref 135–145)

## 2024-05-29 LAB — PHOSPHORUS: Phosphorus: 1.7 mg/dL — ABNORMAL LOW (ref 2.5–4.6)

## 2024-05-29 LAB — MAGNESIUM: Magnesium: 1.7 mg/dL (ref 1.7–2.4)

## 2024-05-29 MED ORDER — K PHOS MONO-SOD PHOS DI & MONO 155-852-130 MG PO TABS
500.0000 mg | ORAL_TABLET | Freq: Four times a day (QID) | ORAL | Status: AC
  Administered 2024-05-29 – 2024-05-30 (×8): 500 mg via ORAL
  Filled 2024-05-29 (×9): qty 2

## 2024-05-29 NOTE — Progress Notes (Signed)
 Daily Progress Note   Patient Name: Angela Lin       Date: 05/29/2024 DOB: 1961/02/21  Age: 63 y.o. MRN#: [id number] Attending Physician: Etter Hermann., * Primary Care Physician: Angela Lobstein, MD Admit Date: 05/22/2024 Length of Stay: 7 days  Reason for Consultation/Follow-up: Establishing goals of care  Subjective:   CC: Patient noting plan for meeting with oncology today at 4:40 PM.  Following up regarding complex medical decision making.  Subjective:  Reviewed EMR prior to presenting to bedside.  Discussed care with hospitalist to coordinate care.  Presented to bedside to meet with patient.  Introduced myself as a member of the palliative medicine team.  Patient able to update this provider plan for meeting this afternoon to discuss her medical care moving forward.  Meeting is supposed to include patient's mother, patient's brother oncology, and hospitalist to discuss results of patient's biopsy and how all patient wishes to proceed with her medical care.  Informed patient that unfortunately PMT has another meeting at this time so will be unable to attend. With permission, able to discuss patient's hopes for care moving forward.  Patient notes that she does not want to start therapies that will cause her adverse effects and worsening symptom burden.  Patient notes she has suffered from effects from cancer directed therapies previously and she does not want to go through that again.  Patient notes that she wants to have her pain well-managed for the time she does have.  Patient is still functional and eating well so discussed how could continue as needed pain medication moving forward to assist with keeping functional status and allowing patient to enjoy the time she has. Patient notes that she would eventually like to pursue long-term care.  Discussed patient may need rehab initially and then transition into long-term care.  Patient can lay her in hospice as well as  long-term care.  Patient cannot have rehab and hospice at the same time.  Patient acknowledged this. Spent time providing emotional support via active listening.  Patient discussed how she is "at peace" when her time comes.  Patient feels spiritually supported and notes how much she has already overcome in life.  Patient just does not want to suffer at the end of her life.  Currently patient feels the medications she is receiving for pain are managing her pain.  Acknowledged this and noted importance of continuing discussions with the providers regarding what is most important to her care moving forward.  Thank patient for allowing me to visit with her today.  Noted palliative medicine team to continue to follow with patient's medical journey.  Review of Systems Feels pain is controlled with oxycodone  as needed Objective:   Vital Signs:  BP 122/69 (BP Location: Right Arm)   Pulse 72   Temp 99.1 F (37.3 C) (Oral)   Resp 20   Ht 5\' 5"  (1.651 m)   Wt 68 kg   LMP 08/27/2008 (Approximate)   SpO2 98%   BMI 24.95 kg/m   Physical Exam: General: NAD, alert, pleasant Cardiovascular: RRR Respiratory: no increased work of breathing noted, not in respiratory distress Neuro: A&Ox4, following commands easily Psych: appropriately answers all questions  Imaging: I personally reviewed recent imaging.   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  During hospitalization patient was found to have a right upper lobe mass with diffuse bony metastatic disease throughout the abdomen and pelvis with a destructive soft tissue mesh in the right L1 pedicle  causing significant spinal cord and neural foraminal narrowing.  Oncology and radiation oncology consulted for recommendations.  Palliative medicine team consulted to assist with complex medical decision making.  Recommendations/Plan: # Complex medical decision making/goals of care:  - Discussed care with patient as detailed above in HPI.  Patient planning to meet with her family, oncology, and hospitalist at 4:40 PM today.  Informed patient PMT unfortunately unable to attend due to another meeting planned at that time.  Patient was able to discuss with this provider that her primary goal for care moving forward is to have appropriate symptom management and enjoy her time.  Patient does not want to seek therapies that would cause her adverse symptom burden.  Patient notes she is "at peace" with her death when the time comes.  At this time patient planning to seek long-term care support after attending rehab.  May consider addition of hospice to long-term care.  Awaiting outcomes of meeting this afternoon.  Palliative medicine team will continue to follow along and engage in conversations as able and appropriate.  -  Code Status: Limited: Do not attempt resuscitation (DNR) -DNR-LIMITED -Do Not Intubate/DNI   # Symptom management: Patient is receiving these palliative interventions for symptom management with an intent to improve quality of life.   - Pain, in setting of metastatic cancer   - Continue as needed oxycodone  5 mg every 4 hours  # Psychosocial Support:  - Mother, brother  # Discharge Planning: To Be Determined  Discussed with: Patient, hospitalist  Thank you for allowing the palliative care team to participate in the care [name]  Barnett Libel, DO Palliative Care Provider PMT # [phone]  If patient remains symptomatic despite maximum doses, please call PMT at [phone] between 0700 and 1900. Outside of these hours, please call attending, as PMT does not have  night coverage.  Personally spent 35 minutes in patient care including extensive chart review (labs, imaging, progress/consult notes, vital signs), medically appropraite exam, discussed with treatment team, education to patient, family, and staff, documenting clinical information, medication review and management, coordination of care, and available advanced directive documents.

## 2024-05-29 NOTE — Plan of Care (Signed)
   Problem: Education: Goal: Knowledge of General Education information will improve Description: Including pain rating scale, medication(s)/side effects and non-pharmacologic comfort measures Outcome: Progressing   Problem: Activity: Goal: Risk for activity intolerance will decrease Outcome: Progressing   Problem: Coping: Goal: Level of anxiety will decrease Outcome: Progressing

## 2024-05-29 NOTE — Progress Notes (Signed)
 Path results still not available  Will follow peripherally

## 2024-05-29 NOTE — Progress Notes (Signed)
   05/29/24 1340  Spiritual Encounters  Type of Visit Initial  Care provided to: Patient  Referral source Chaplain team  Reason for visit Routine spiritual support  OnCall Visit No   I met with the patient Angela Lin who expressed a solid affirmation that she is ready to die. She is confident that where she is going is much better than the present situation.  [name] was concerned  Though that I was going to tell her she has two weeks to live. I assured her I don't deliver news like that. [name] is optimistic about the scheduled meeting that she and her family are having with the medical team, will give her a clear way forward which includes going home at some point.   Clarence Croak  [facility]  [phone]

## 2024-05-29 NOTE — Progress Notes (Signed)
 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

## 2024-05-29 NOTE — Progress Notes (Signed)
 Occupational Therapy Treatment Patient Details Name: Quincee Gittens MRN: [id number] DOB: 18-Oct-1961 Today's Date: 05/29/2024   History of present illness 63 yr old female admitted with abnormal labs-hypercalcemia, new R lung mass. Imaging showed multifocal bone mets throughout the ribs, spine and scapula, pathologic fracture of T7, lateral lower left ribs, inferior right scapula, L pubic ramus fx. Hx of neuroendocrine CA-lumpectomy, met breast CA.   OT comments  The pt was motivated to participate in the session. She denied having pain, however reported feelings of generalized weakness and fatigue in her B LE with standing activities. She required SBA to CGA overall and use of a RW for support in standing. She expresses some concerns about managing her daily activities in the home, as she lives alone. As such, she is interested in potential short-term SNF rehab, once discharged from the hospital. Continue OT plan of care.       If plan is discharge home, recommend the following:  Assistance with cooking/housework;Help with stairs or ramp for entrance   Equipment Recommendations  Tub/shower seat    Recommendations for Other Services      Precautions / Restrictions Restrictions Weight Bearing Restrictions Per Provider Order: No       Mobility Bed Mobility Overal bed mobility: Modified Independent                  Transfers Overall transfer level: Needs assistance Equipment used: Rolling walker (2 wheels) Transfers: Sit to/from Stand Sit to Stand: Supervision                 Balance     Sitting balance-Leahy Scale: Good       Standing balance-Leahy Scale: Fair              ADL either performed or assessed with clinical judgement   ADL       Grooming: Set up;Bed level Grooming Details (indicate cue type and reason): The pt performed face washing in upright sitting at bed level.               Lower Body Dressing Details (indicate cue type  and reason): The pt requested assist to donn both socks seated EOB. OT anticipates pt would be able to perform tasks with no more than min overall assist.                     Vision Baseline Vision/History: 1 Wears glasses           Communication Communication Communication: No apparent difficulties   Cognition Arousal: Alert Behavior During Therapy: WFL for tasks assessed/performed    OT - Cognition Comments: Oriented x4                 Following commands: Intact                      Pertinent Vitals/ Pain       Pain Assessment Pain Assessment: No/denies pain   Frequency  Min 2X/week        Progress Toward Goals  OT Goals(current goals can now be found in the care plan section)  Progress towards OT goals: Progressing toward goals  Acute Rehab OT Goals OT Goal Formulation: With patient Time For Goal Achievement: 06/09/24 Potential to Achieve Goals: Good  Plan         AM-PAC OT "6 Clicks" Daily Activity     Outcome Measure   Help from another person eating meals?: None Help  from another person taking care of personal grooming?: None Help from another person toileting, which includes using toliet, bedpan, or urinal?: A Little Help from another person bathing (including washing, rinsing, drying)?: A Little Help from another person to put on and taking off regular upper body clothing?: None Help from another person to put on and taking off regular lower body clothing?: A Little 6 Click Score: 21    End of Session Equipment Utilized During Treatment: Rolling walker (2 wheels)  OT Visit Diagnosis: Pain;Other abnormalities of gait and mobility (R26.89)   Activity Tolerance Patient tolerated treatment well   Patient Left in bed;with call bell/phone within reach;with bed alarm set   Nurse Communication Mobility status        Time: 1610-9604 OT Time Calculation (min): 22 min  Charges: OT General Charges $OT Visit: 1 Visit OT  Treatments $Therapeutic Activity: 8-22 mins     Sheralyn Dies, OTR/L 05/29/2024, 2:56 PM

## 2024-05-29 NOTE — Progress Notes (Signed)
 PROGRESS NOTE    Angela Lin  ZOX:[id number] DOB: [birth year] DOA: 05/22/2024 PCP: Helyn Lobstein, MD  Chief Complaint  Patient presents with   Hydration   Abnormal Labs    Brief Narrative:   63 y.o. 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  Patient follows with Dr. Gudena. Patient had outpatient labs performed revealing severe hypercalcemia patient was instructed to come to the [facility] Emergency Department for evaluation.   Upon evaluation in the emergency department, CT imaging of the chest abdomen and pelvis was performed revealing Angela Lin right upper lobe mass measuring 5.8 x 5.7 cm in addition to diffuse bony metastatic disease throughout the abdomen and pelvis with Mixtli Reno destructive soft tissue mass in the right L1 pedicle causing significant spinal canal and neuroforaminal narrowing.  Case was discussed with Dr. Larrie Po of neurosurgery who recommended oncology and radiation oncology consultations for potential management.  Patient was initiated on intravenous fluids for the severe hypercalcemia and the hospitalist group was then called to assess the patient for admission to the hospital.   Patient treated with IVF, bisphosphonate, calcitonin.     She's now s/p bronchoscopy.  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    She's not interested in treatment per discussion with her oncology (Dr. Lee Public) 6/2.  They'll have additional discussions today, 6/3.  Likely plan for focus on comfort.    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  Likely going to focus on comfort.  Oncology to discuss additionally with her this afternoon.    Hypercalcemia of Malignancy Right Upper Lobe Mass  Mediastinal Adenopathy  Multifocal Osseous Metastatic Disease  Hypodensities in the liver concerning for metastatic disease S/p calcitonin and zometa  - improved calcium  today Cytology from 5/29 bronch showing malignant cells most c/w metastatic breast carcinoma and malignant cells consistent with non small cell carcinoma  Follows with Dr. Lee Public outpatient Radiation onc planning for radiation to T7, L1, and right rib metastases (first treatment 6/2) Had MRI C/T/L spine, canceled, will follow rationale (per discussion with provider, related to AKI - with plan to forgo treatment, will hold off on additional spine imaging at this time - note recommended thoracic spine MRI to assess spinal canal patency - also, destructive soft tissue mass in R L1 pedicle causing significant spinal canal and neuroforaminal narrowing)  Hx Breast Cancer with Lung and Bone Mets Follows with Dr. Lee Public Workup for malignancy as noted above  AKI Resolved  Pathologic Pubic Rami Fractures WBAT Non op management per orth (5/28 note from Dorthy Gavia)  Hypertenion Amlodipine , metoprolol    Leukocytosis Related to steroids    DVT prophylaxis: SCD Code Status: DNR Family Communication: friend at bedside Disposition:   Status is: Inpatient Remains inpatient appropriate because: awaiting SNF placement   Consultants:  Oncology Rad onc  Pulmonary  Palliative care  Procedures:   5/29 Flexible video fiberoptic bronchoscopy with robotic assistance  and biopsies.    Antimicrobials:  Anti-infectives (From admission, onward)    None       Subjective: No complaints Pain is ok  Objective: Vitals:   05/28/24 2005 05/28/24 2139 05/29/24 0513 05/29/24 1246  BP: 135/77  123/75 122/69  Pulse: 80  72 72  Resp: 19 16 18 20   Temp: 98.9 F (37.2 C)  98.4 F (36.9 C) 99.1 F (37.3 C)  TempSrc: Oral  Oral Oral  SpO2: 96%  98% 98%  Weight:      Height:       No intake or output data in the 24 hours ending 05/29/24 1529  Filed Weights   05/22/24 2300  Weight: 68 kg    Examination:  General: No acute distress. Lungs: unlabored Neurological: Corderro Koloski&Ox3, moving all extremities  Extremities: No clubbing or cyanosis. No edema.  Data Reviewed: I have personally reviewed following labs and imaging studies  CBC: Recent Labs  Lab 05/24/24 0405 05/25/24 0359 05/26/24 0425 05/27/24 0343 05/28/24 0346 05/29/24 0416  WBC 7.0 10.3 14.8* 14.4* 15.2* 18.6*  NEUTROABS 5.9 9.5* 13.4* 12.5* 12.6*  --   HGB 10.8* 10.8* 10.2* 10.6* 10.8* 10.9*  HCT 32.6* 32.1* 30.9* 31.7* 31.7* 31.6*  MCV 88.6 86.5 88.0 86.1 86.8 85.2  PLT 275 261 304 305 277 239    Basic Metabolic Panel: Recent Labs  Lab 05/25/24 0359 05/26/24 0425 05/27/24 0343 05/28/24 0346 05/29/24 0416  NA 135 135 136 133* 133*  K 3.5 3.5 3.2* 3.3* 3.5  CL 106 108 106 104 104  CO2 21* 21* 20* 21* 21*  GLUCOSE 119* 119* 125* 119* 122*  BUN 23 23 22 20 22   CREATININE 1.82* 1.41* 1.48* 1.15* 0.87  CALCIUM  11.0* 9.5 9.2 8.5* 8.6*  MG 1.2* 2.2  --  1.8 1.7  PHOS  --   --  1.7*  --  1.7*    GFR: Estimated Creatinine Clearance: 60.3 mL/min (by C-G formula based on SCr of 0.87 mg/dL).  Liver Function Tests: Recent Labs  Lab 05/23/24 0026 05/24/24 0405 05/25/24 0359 05/26/24 0425 05/27/24 0343 05/28/24 0346  AST 18 17 17 16   --  15  ALT 24 17 19 19   --  20  ALKPHOS 140* 117 122 119  --  128*  BILITOT 0.4 0.6 0.6 0.3  --  0.6  PROT 6.9 6.0* 6.3* 6.0*  --  6.4*   ALBUMIN 3.0* 2.7* 2.5* 2.6* 2.8* 2.8*    CBG: No results for input(s): "GLUCAP" in the last 168 hours.   No results found for this or any previous visit (from the past 240 hours).       Radiology Studies: No results found.      Scheduled Meds:  acetaminophen   1,000 mg Oral Once   amLODipine   5 mg Oral Daily   atorvastatin   10 mg Oral Daily   dexamethasone   2 mg Oral BID   feeding supplement  237 mL Oral BID BM   letrozole   2.5 mg Oral Daily   metoprolol  succinate  150 mg Oral Daily   oxyCODONE   5 mg Oral BID   phosphorus  500 mg Oral QID   senna  2 tablet Oral QHS   Continuous Infusions:   LOS: 7 days    Time spent: over 30 min    Donnetta Gains, MD [facility]   To contact the attending provider between 7A-7P or the covering  provider during after hours 7P-7A, please log into the web site [email or web address] and access using universal [place] password for that web site. If you do not have the password, please call the hospital operator.  05/29/2024, 3:29 PM

## 2024-05-30 ENCOUNTER — Encounter: Admitting: Physical Therapy

## 2024-05-30 ENCOUNTER — Ambulatory Visit

## 2024-05-30 DIAGNOSIS — R4589 Other symptoms and signs involving emotional state: Secondary | ICD-10-CM | POA: Diagnosis not present

## 2024-05-30 DIAGNOSIS — M84454A Pathological fracture, pelvis, initial encounter for fracture: Secondary | ICD-10-CM | POA: Diagnosis not present

## 2024-05-30 DIAGNOSIS — Z515 Encounter for palliative care: Secondary | ICD-10-CM | POA: Diagnosis not present

## 2024-05-30 DIAGNOSIS — C7951 Secondary malignant neoplasm of bone: Secondary | ICD-10-CM

## 2024-05-30 DIAGNOSIS — M8448XA Pathological fracture, other site, initial encounter for fracture: Secondary | ICD-10-CM | POA: Diagnosis not present

## 2024-05-30 LAB — BASIC METABOLIC PANEL WITH GFR
Anion gap: 12 (ref 5–15)
BUN: 21 mg/dL (ref 8–23)
CO2: 22 mmol/L (ref 22–32)
Calcium: 7.7 mg/dL — ABNORMAL LOW (ref 8.9–10.3)
Chloride: 99 mmol/L (ref 98–111)
Creatinine, Ser: 1.13 mg/dL — ABNORMAL HIGH (ref 0.44–1.00)
GFR, Estimated: 55 mL/min — ABNORMAL LOW (ref >60)
Glucose, Bld: 181 mg/dL — ABNORMAL HIGH (ref 70–99)
Potassium: 2.8 mmol/L — ABNORMAL LOW (ref 3.5–5.1)
Sodium: 133 mmol/L — ABNORMAL LOW (ref 135–145)

## 2024-05-30 LAB — PHOSPHORUS: Phosphorus: 2.8 mg/dL (ref 2.5–4.6)

## 2024-05-30 MED ORDER — POTASSIUM CHLORIDE CRYS ER 20 MEQ PO TBCR
40.0000 meq | EXTENDED_RELEASE_TABLET | Freq: Once | ORAL | Status: AC
  Administered 2024-05-30: 40 meq via ORAL
  Filled 2024-05-30: qty 2

## 2024-05-30 MED ORDER — POTASSIUM CHLORIDE CRYS ER 20 MEQ PO TBCR
40.0000 meq | EXTENDED_RELEASE_TABLET | Freq: Once | ORAL | Status: AC
  Administered 2024-05-30: 40 meq via ORAL
  Filled 2024-05-30 (×2): qty 2

## 2024-05-30 MED ORDER — POTASSIUM CHLORIDE 10 MEQ/100ML IV SOLN
10.0000 meq | INTRAVENOUS | Status: DC
  Filled 2024-05-30: qty 100

## 2024-05-30 NOTE — Progress Notes (Signed)
 PROGRESS NOTE Angela Lin  JYN:[id number] DOB: 17-Jul-1961 DOA: 05/22/2024 PCP: Helyn Lobstein, MD  Brief Narrative/Hospital Course: 63 y.o. 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  Patient follows with Dr. Gudena. Patient had outpatient labs performed revealing severe hypercalcemia patient was instructed to come to the [facility] Emergency Department for evaluation. in ED:CT imaging of the chest abdomen and pelvis was performed revealing a right upper lobe mass measuring 5.8 x 5.7 cm in addition to diffuse bony metastatic disease throughout the abdomen and pelvis with a destructive soft tissue mass in the right L1 pedicle causing significant spinal canal and neuroforaminal narrowing.  Case was discussed with Dr. Larrie Po of neurosurgery who recommended oncology and radiation oncology consultations for potential management.  Patient was initiated on intravenous fluids for the severe hypercalcemia and the hospitalist group was then called to assess the patient for admission to the hospital. Patient treated with IVF, bisphosphonate, calcitonin.   She's now s/p bronchoscopy.  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   She's not interested in treatment per discussion with her oncology (Dr. Lee Public) 6/2.   Subjective: Patient seen and examined this morning Pain controled on current regimen.  No complaints Overnight afebrile BP stable on room air No labs this morning-came back with potassium 2.8 calcium  7.7 normal creatinine 1.13  Assessment and plan:  Metastatic breast cancer with lung and bone mets Hypercalcemia of Malignancy Goals of care:: She has Right Upper Lobe Mass, Mediastinal Adenopathy Multifocal Osseous Metastatic  Disease Hypodensities in the liver concerning for metastatic disease. S/p calcitonin and zometa  - ca better Cytology from 5/29 bronch showing malignant cells most c/w metastatic breast carcinoma and malignant cells consistent with non small cell carcinoma .  Oncology discuss with her in detail yesterday she does not want to pursue any further cancer treatment wants to consider rehabilitation.Radiation onc planning for radiation to T7, L1, and right rib metastases (first treatment 6/2)  PTOT consult done pending SNF bed offers.  Hx Breast Cancer with Lung and Bone Mets Follows with Dr. Lee Public. Workup for malignancy as noted above   AKI Hypokalemia: Repalce k iv and po. Creat better.  Recheck in the morning   Pathologic Pubic Rami Fractures: WBAT, cont Non op management per orth (5/28 note from Dorthy Gavia)   Hypertension: BP stable, continue Amlodipine , metoprolol     Leukocytosis Related to steroids     DVT prophylaxis: Place and maintain sequential compression device Start: 05/28/24 1558 Code Status:   Code Status: Limited: Do not attempt resuscitation (DNR) -DNR-LIMITED -Do Not Intubate/DNI  Family Communication: plan of care discussed with patient at bedside. Patient status is: Remains hospitalized because of severity of illness Level of care: Progressive   Dispo: The patient is from: home            Anticipated disposition: Awaiting skilled nursing facility Objective: Vitals last 24 hrs: Vitals:   05/29/24 1246 05/29/24 1956 05/30/24 0300 05/30/24 0825  BP: 122/69 110/70 (!) 104/92 109/71  Pulse: 72 64 66 80  Resp: 20 16 16    Temp: 99.1 F (37.3 C) 98.2 F (36.8 C) 98.2 F (36.8 C)   TempSrc: Oral Oral Oral   SpO2: 98% 98% 99%   Weight:      Height:        Physical Examination: General exam: alert awake,  older than stated age HEENT:Oral mucosa moist, Ear/Nose WNL grossly Respiratory system: Bilaterally clear BS, no use of accessory muscle Cardiovascular system:  S1 & S2 +. Gastrointestinal system: Abdomen soft,  NT,ND,BS+ Nervous System: Alert, awake, following commands. Extremities: LE edema neg, warm extremities Skin: No rashes,warm. MSK: Normal muscle bulk/tone.   Data Reviewed: I have personally reviewed following labs and imaging studies ( see epic result tab) CBC: Recent Labs  Lab 05/24/24 0405 05/25/24 0359 05/26/24 0425 05/27/24 0343 05/28/24 0346 05/29/24 0416  WBC 7.0 10.3 14.8* 14.4* 15.2* 18.6*  NEUTROABS 5.9 9.5* 13.4* 12.5* 12.6*  --   HGB 10.8* 10.8* 10.2* 10.6* 10.8* 10.9*  HCT 32.6* 32.1* 30.9* 31.7* 31.7* 31.6*  MCV 88.6 86.5 88.0 86.1 86.8 85.2  PLT 275 261 304 305 277 239   CMP: Recent Labs  Lab 05/25/24 0359 05/26/24 0425 05/27/24 0343 05/28/24 0346 05/29/24 0416 05/30/24 0906  NA 135 135 136 133* 133* 133*  K 3.5 3.5 3.2* 3.3* 3.5 2.8*  CL 106 108 106 104 104 99  CO2 21* 21* 20* 21* 21* 22  GLUCOSE 119* 119* 125* 119* 122* 181*  BUN 23 23 22 20 22 21   CREATININE 1.82* 1.41* 1.48* 1.15* 0.87 1.13*  CALCIUM  11.0* 9.5 9.2 8.5* 8.6* 7.7*  MG 1.2* 2.2  --  1.8 1.7  --   PHOS  --   --  1.7*  --  1.7* 2.8   GFR: Estimated Creatinine Clearance: 46.4 mL/min (A) (by C-G formula based on SCr of 1.13 mg/dL (H)). Recent Labs  Lab 05/24/24 0405 05/25/24 0359 05/26/24 0425 05/27/24 0343 05/28/24 0346  AST 17 17 16   --  15  ALT 17 19 19   --  20  ALKPHOS 117 122 119  --  128*  BILITOT 0.6 0.6 0.3  --  0.6  PROT 6.0* 6.3* 6.0*  --  6.4*  ALBUMIN 2.7* 2.5* 2.6* 2.8* 2.8*   No results for input(s): "LIPASE", "AMYLASE" in the last 168 hours. No results for input(s): "AMMONIA" in the last 168 hours. Coagulation Profile:  Recent Labs  Lab 05/24/24 0405  INR 1.1   Unresulted Labs (From admission, onward)     Start     Ordered   05/31/24 0500  Basic metabolic panel  Tomorrow morning,   R       Question:  Specimen collection method  Answer:  Lab=Lab collect   05/30/24 1040   05/31/24 0500  Magnesium   Tomorrow  morning,   R       Question:  Specimen collection method  Answer:  Lab=Lab collect   05/30/24 1040   05/31/24 0500  Phosphorus  Tomorrow morning,   R       Question:  Specimen collection method  Answer:  Lab=Lab collect   05/30/24 1040           Antimicrobials/Microbiology: Anti-infectives (From admission, onward)    None      No results found for: "SDES", "SPECREQUEST", "CULT", "REPTSTATUS"  Procedures: Procedure(s) (LRB): BRONCHOSCOPY, WITH EBUS (Bilateral) VIDEO BRONCHOSCOPY WITH ENDOBRONCHIAL NAVIGATION (Right) VIDEO BRONCHOSCOPY WITH RADIAL ENDOBRONCHIAL ULTRASOUND BRONCHOSCOPY, WITH NEEDLE ASPIRATION BIOPSY BRONCHOSCOPY, WITH BRUSH BIOPSY BRONCHOSCOPY, WITH BIOPSY IRRIGATION, BRONCHUS Medications reviewed:  Scheduled Meds:  acetaminophen   1,000 mg Oral Once   amLODipine   5 mg Oral Daily   atorvastatin   10 mg Oral Daily   dexamethasone   2 mg Oral BID   feeding supplement  237 mL Oral BID BM   letrozole   2.5 mg  Oral Daily   metoprolol  succinate  150 mg Oral Daily   oxyCODONE   5 mg Oral BID   phosphorus  500 mg Oral QID   potassium chloride   40 mEq Oral Once   senna  2 tablet Oral QHS   Continuous Infusions:  potassium chloride       Lesa Rape, MD [facility] 05/30/2024, 11:35 AM

## 2024-05-30 NOTE — Progress Notes (Signed)
 Daily Progress Note   Patient Name: Angela Lin       Date: 05/30/2024 DOB: Mar 18, 1961  Age: 63 y.o. MRN#: [id number] Attending Physician: Angela Rape, MD Primary Care Physician: Angela Lobstein, MD Admit Date: 05/22/2024 Length of Stay: 8 days  Reason for Consultation/Follow-up: Establishing goals of care  Subjective:   CC: Patient laying in bed.  Following up regarding complex medical decision making.  Subjective:  Reviewed EMR prior to presenting to bedside.  Reviewed recent hospitalist and oncologist note.  Patient is not planning to pursue any cancer directed therapies.  Recent TOC note stating patient does not have any rehab bed offers and will have to work with PT at the hospital until able to return home safely. Review of BMP today noting sodium 133, potassium 2.8, BUN 21, creatinine 1.13, and GFR of 50.  Presented to bedside to meet with patient.  Patient welcoming visit with this provider.  Patient's friend present at bedside.  Patient gave permission to continue with conversation.  Patient about to receive IV and oral potassium.  Patient not wanting to have discomfort associated with IV potassium.  Noted would reach out to hospitalist about changing all to oral.  Patient acknowledged risks of low potassium.  Patient's primary concern at this time is her quality of life and not suffering from symptoms like pain.  Acknowledged this.   Spent time discussing care planning moving forward.  If patient is not excepted to rehab, will will need to work with physical therapy here in the hospital to regain strength.  Discussed importance of planning moving forward with patient to receive supportive care even if at home.  Discussed addition of hospice and what could and cannot be provided by this.  Patient would still need assistance with care around the home.  Discussed may be an out-of-pocket cost associated with that.  Patient planning to continue discussions with family to coordinate  care.  Spent time providing emotional support via active listening.  All questions answered at that time.  Noted palliative medicine to be continue to follow with patient's medical care.  Review of Systems Feels pain is controlled with oxycodone  as needed Objective:   Vital Signs:  BP 109/71   Pulse 80   Temp 98.2 F (36.8 C) (Oral)   Resp 16   Ht 5\' 5"  (1.651 m)   Wt 68 kg   LMP 08/27/2008 (Approximate)   SpO2 99%   BMI 24.95 kg/m   Physical Exam: General: NAD, alert, pleasant Cardiovascular: RRR Respiratory: no increased work of breathing noted, not in respiratory distress Neuro: A&Ox4, following commands easily Psych: appropriately answers all questions  Imaging: I personally reviewed recent imaging.   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  During hospitalization patient was found to have a right upper lobe mass with diffuse bony metastatic disease throughout the abdomen and pelvis with a destructive soft tissue mesh in the right L1 pedicle causing significant spinal cord and neural foraminal narrowing.  Oncology and radiation oncology consulted for recommendations.  Palliative medicine team consulted to assist with complex medical decision making.  Recommendations/Plan: # Complex medical decision making/goals of care:  - Discussed care with patient as detailed above in HPI.  Patient is not planning to seek further cancer therapies.  Patient noted she is "at peace" with her death when the time comes.  Patient does not have any bed offers for rehab.  May have to work with physical therapy in hospital until can regain  strength to return home.  Can potentially layer in home hospice if patient goes home.  Palliative medicine team will continue to follow along and engage in conversations as able and appropriate.  -  Code Status: Limited: Do not attempt resuscitation (DNR) -DNR-LIMITED -Do Not Intubate/DNI   # Symptom management: Patient is receiving these palliative interventions for symptom management with an intent to improve quality of life.   - Pain, in setting of metastatic cancer   - Continue as needed oxycodone  5 mg every 4 hours  # Psychosocial Support:  - Mother, brother  # Discharge Planning: To Be Determined  Discussed with: Patient, hospitalist  Thank you for allowing the palliative care team to participate in the care Angela Lin.  Angela Libel, DO Palliative Care Provider PMT # [phone]  If patient remains symptomatic despite maximum doses, please call PMT at [phone] between 0700 and 1900. Outside of these hours, please call attending, as PMT does not have night coverage.  Personally spent 35 minutes in patient care including extensive chart review (labs, imaging, progress/consult notes, vital signs), medically appropraite exam, discussed with treatment team, education to patient, family, and staff, documenting clinical information, medication review and management, coordination of care, and available advanced directive documents.

## 2024-05-30 NOTE — Progress Notes (Signed)
 PT Cancellation Note  Patient Details Name: Angela Lin MRN: [id number] DOB: December 30, 1960   Cancelled Treatment:    Reason Eval/Treat Not Completed:  Pt soundly sleeping-did not awaken. Will check back another day.    Tanda Falter, PT Acute Rehabilitation  Office: [phone]

## 2024-05-30 NOTE — TOC Progression Note (Signed)
 Transition of Care [facility]) - Progression Note    Patient Details  Name: Angela Lin MRN: [id number] Date of Birth: 05-11-1961  Transition of Care [facility]) CM/SW Contact  Gertha Ku, LCSW Phone Number: 05/30/2024, 8:51 AM  Clinical Narrative:     Pt still with no bed offers. Pt will have to work with PT here at the hospital to be able to return home safely. TOC to follow.   Expected Discharge Plan:  (TBD) Barriers to Discharge: Continued Medical Work up  Expected Discharge Plan and Services       Living arrangements for the past 2 months: Single Family Home                                       Social Determinants of Health (SDOH) Interventions SDOH Screenings   Food Insecurity: No Food Insecurity (05/22/2024)  Housing: Low Risk  (05/22/2024)  Transportation Needs: No Transportation Needs (05/22/2024)  Utilities: Not At Risk (05/22/2024)  Tobacco Use: High Risk (05/24/2024)    Readmission Risk Interventions    05/26/2024    4:01 PM  Readmission Risk Prevention Plan  Post Dischage Appt Complete  Medication Screening Complete  Transportation Screening Complete

## 2024-05-30 NOTE — Progress Notes (Signed)
 S: Able to tolerate back pain.  She informed me that she does not want to continue with radiation anymore.     05/30/2024    8:25 AM 05/30/2024    3:00 AM 05/29/2024    7:56 PM  Vitals with BMI  Systolic 109 104 119  Diastolic 71 92 70  Pulse 80 66 64      Latest Ref Rng & Units 05/29/2024    4:16 AM 05/28/2024    3:46 AM 05/27/2024    3:43 AM  CBC  WBC 4.0 - 10.5 K/uL 18.6  15.2  14.4   Hemoglobin 12.0 - 15.0 g/dL 14.7  82.9  56.2   Hematocrit 36.0 - 46.0 % 31.6  31.7  31.7   Platelets 150 - 400 K/uL 239  277  305       Latest Ref Rng & Units 05/29/2024    4:16 AM 05/28/2024    3:46 AM 05/27/2024    3:43 AM  CMP  Glucose 70 - 99 mg/dL 130  865  784   BUN 8 - 23 mg/dL 22  20  22    Creatinine 0.44 - 1.00 mg/dL 6.96  2.95  2.84   Sodium 135 - 145 mmol/L 133  133  136   Potassium 3.5 - 5.1 mmol/L 3.5  3.3  3.2   Chloride 98 - 111 mmol/L 104  104  106   CO2 22 - 32 mmol/L 21  21  20    Calcium  8.9 - 10.3 mg/dL 8.6  8.5  9.2   Total Protein 6.5 - 8.1 g/dL  6.4    Total Bilirubin 0.0 - 1.2 mg/dL  0.6    Alkaline Phos 38 - 126 U/L  128    AST 15 - 41 U/L  15    ALT 0 - 44 U/L  20     Assessment and plan: 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  I discussed with the patient that I requested the prognostic panel and molecular testing.  However patient informed me that she does not want to pursue any treatments. She is planning to go to a skilled nursing facility for some rehab. Subsequently depending on how she feels she might proceed to hospice. She is contemplating on applying for disability.  She works for [employer].

## 2024-05-31 ENCOUNTER — Ambulatory Visit

## 2024-05-31 DIAGNOSIS — Z515 Encounter for palliative care: Secondary | ICD-10-CM | POA: Diagnosis not present

## 2024-05-31 DIAGNOSIS — M8448XA Pathological fracture, other site, initial encounter for fracture: Secondary | ICD-10-CM | POA: Diagnosis not present

## 2024-05-31 DIAGNOSIS — Z66 Do not resuscitate: Secondary | ICD-10-CM | POA: Diagnosis not present

## 2024-05-31 DIAGNOSIS — M84454A Pathological fracture, pelvis, initial encounter for fracture: Secondary | ICD-10-CM | POA: Diagnosis not present

## 2024-05-31 LAB — POCT I-STAT CREATININE: Creatinine, Ser: 2.6 mg/dL — ABNORMAL HIGH (ref 0.44–1.00)

## 2024-05-31 LAB — BASIC METABOLIC PANEL WITH GFR
Anion gap: 9 (ref 5–15)
BUN: 20 mg/dL (ref 8–23)
CO2: 21 mmol/L — ABNORMAL LOW (ref 22–32)
Calcium: 7.4 mg/dL — ABNORMAL LOW (ref 8.9–10.3)
Chloride: 103 mmol/L (ref 98–111)
Creatinine, Ser: 1.13 mg/dL — ABNORMAL HIGH (ref 0.44–1.00)
GFR, Estimated: 55 mL/min — ABNORMAL LOW (ref >60)
Glucose, Bld: 129 mg/dL — ABNORMAL HIGH (ref 70–99)
Potassium: 3.5 mmol/L (ref 3.5–5.1)
Sodium: 133 mmol/L — ABNORMAL LOW (ref 135–145)

## 2024-05-31 LAB — MAGNESIUM: Magnesium: 1.6 mg/dL — ABNORMAL LOW (ref 1.7–2.4)

## 2024-05-31 LAB — CYTOLOGY - NON PAP

## 2024-05-31 LAB — PHOSPHORUS: Phosphorus: 3.1 mg/dL (ref 2.5–4.6)

## 2024-05-31 MED ORDER — MAGNESIUM OXIDE -MG SUPPLEMENT 400 (240 MG) MG PO TABS: 400.0000 mg | ORAL_TABLET | Freq: Two times a day (BID) | ORAL | Status: DC

## 2024-05-31 MED ORDER — ENOXAPARIN SODIUM 40 MG/0.4ML IJ SOSY
40.0000 mg | PREFILLED_SYRINGE | Freq: Every day | INTRAMUSCULAR | Status: DC
  Administered 2024-05-31: 40 mg via SUBCUTANEOUS
  Filled 2024-05-31 (×4): qty 0.4

## 2024-05-31 MED ORDER — MAGNESIUM OXIDE -MG SUPPLEMENT 400 (240 MG) MG PO TABS
400.0000 mg | ORAL_TABLET | Freq: Two times a day (BID) | ORAL | Status: AC
  Administered 2024-05-31 – 2024-06-01 (×4): 400 mg via ORAL
  Filled 2024-05-31 (×4): qty 1

## 2024-05-31 NOTE — Radiation Completion Notes (Addendum)
  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  Intent: Palliative     ==========DELIVERED PLANS==========  First Treatment Date: 2024-05-28 Last Treatment Date: 2024-05-28   Plan Name: Spine_T7 Site: Thoracic Spine Technique: 3D Mode: Photon Dose Per Fraction: 3 Gy Prescribed Dose (Delivered / Prescribed): 3 Gy / 30 Gy Prescribed Fxs (Delivered / Prescribed): 1 / 10   Plan Name: Spine_L1 Site: Lumbar Spine Technique: 3D Mode: Photon Dose Per Fraction: 3 Gy Prescribed Dose (Delivered / Prescribed): 3 Gy / 30 Gy Prescribed Fxs (Delivered / Prescribed): 1 / 10     ==========ON TREATMENT VISIT DATES==========     See weekly On Treatment Notes in Epic for details in the Media tab (listed as Progress notes on the On Treatment Visit Dates listed above).    The patient tolerated on treatment but on 05/29/24 discussed with her oncologist Dr. Gudena she did not wish to proceed with any additional therapy.   She will continue follow up with Dr. Gudena as needed after plans for enrolling in hospice care.     Shelvia Dick, PAC

## 2024-05-31 NOTE — Progress Notes (Signed)
 OT Cancellation Note  Patient Details Name: Angela Lin MRN: [id number] DOB: 1961/06/03   Cancelled Treatment:    Reason Eval/Treat Not Completed: Patient declined, no reason specified. OT attempt. Pt refuses to participate in therapy at this time, states she has already done PT and requests to rest as she is feeling "agitated". OT will re-check back at later time.Bridgette [facility] L. Luberta Grabinski, OTR/L  05/31/24, 2:11 PM

## 2024-05-31 NOTE — Plan of Care (Signed)
  Problem: Clinical Measurements: Goal: Will remain free from infection Outcome: Progressing   Problem: Clinical Measurements: Goal: Cardiovascular complication will be avoided Outcome: Progressing   Problem: Activity: Goal: Risk for activity intolerance will decrease Outcome: Progressing   Problem: Coping: Goal: Level of anxiety will decrease Outcome: Progressing   Problem: Nutrition: Goal: Adequate nutrition will be maintained Outcome: Progressing

## 2024-05-31 NOTE — TOC Progression Note (Signed)
 Transition of Care [facility]) - Progression Note    Patient Details  Name: Angela Lin MRN: [id number] Date of Birth: [birth year]  Transition of Care [facility]) CM/SW Contact  Gertha Ku, LCSW Phone Number: 05/31/2024, 2:36 PM  Clinical Narrative:    CSW met with the pt at bedside to discuss discharge planning. Pt currently has no bed offers for SNF placement. CSW informed the pt that she may need to discharge back home. CSW explained that home health services may be arranged, but typically involve visits no more than three times a week for approximately one hour each. Pt expressed that she will require 24-hour care. CSW attempted to discuss private pay options, but the pt requested that CSW contact her brother and asked CSW to leave the room.  CSW spoke with the pt's brother and provided an update on the situation. CSW reviewed the pt's options if pt is to return home. Pt's brother requested that CSW reach back out to [facility], a facility the pt declined earlier this week, to see if they can now accept her.  CSW contacted Ira Mann at [facility] to inquire about the pt's case. The facility has offered the pt a SNF bed and will begin insurance authorization.  CSW attempted to contact the pt's brother to provide an update; there was no answer, and a voicemail was left. TOC to follow.     Expected Discharge Plan and Services       Living arrangements for the past 2 months: Single Family Home                                       Social Determinants of Health (SDOH) Interventions SDOH Screenings   Food Insecurity: No Food Insecurity (05/22/2024)  Housing: Low Risk  (05/22/2024)  Transportation Needs: No Transportation Needs (05/22/2024)  Utilities: Not At Risk (05/22/2024)  Tobacco Use: High Risk (05/24/2024)    Readmission Risk Interventions    05/26/2024    4:01 PM  Readmission Risk Prevention Plan  Post Dischage Appt Complete  Medication  Screening Complete  Transportation Screening Complete

## 2024-05-31 NOTE — Progress Notes (Signed)
 Daily Progress Note   Patient Name: Angela Lin       Date: 05/31/2024 DOB: 12-18-1961  Age: 63 y.o. MRN#: [id number] Attending Physician: Lesa Rape, MD Primary Care Physician: Helyn Lobstein, MD Admit Date: 05/22/2024 Length of Stay: 9 days  Reason for Consultation/Follow-up: Establishing goals of care  Subjective:   CC: Patient laying in bed.  Following up regarding complex medical decision making.  Subjective:  Reviewed EMR prior to presenting to bedside.  Patient's BMP noting potassium improved to 3.5 after repletion yesterday though magnesium  low at 1.6. Discussed care with hospitalist, TOC, and continue to coordinate care.  Discussed that the patient was not candidate for facility, would need hospice support at home.  TOC discussed with patient and her brother.  Patient's brother noted that there was a facility that was willing to review patient for bed offer.  TOC reaching back out to this facility, [facility], to start insurance authorization for patient to go there.  Presented to bedside to see patient.  Patient lying comfortably in bed.  No visitors present at bedside.  Able to follow-up regarding patient's symptom management.  Patient still having pain though feels the oxycodone  5 mg as needed does assist with pain management.  Discussed care planning moving forward.  Noted TOC reaching out to [facility] for insurance authorization.  Patient agreeing with this plan.  Patient noted her brother is coordinating these matters and she greatly appreciates his assistance.  Allowing time for outcome of insurance authorization to further discuss care moving forward.  Spent time providing emotional support via active listening.  Normalized patient's feelings regarding transition.  Answered at that time.  Noted palliative medicine team to continue to follow patient's medical journey.  Review of Systems Feels pain is controlled with oxycodone  as needed Objective:   Vital  Signs:  BP 114/60 (BP Location: Left Arm)   Pulse 70   Temp 98.6 F (37 C) (Oral)   Resp 18   Ht 5\' 5"  (1.651 m)   Wt 68 kg   LMP 08/27/2008 (Approximate)   SpO2 98%   BMI 24.95 kg/m   Physical Exam: General: NAD, alert, pleasant Cardiovascular: RRR Respiratory: no increased work of breathing noted, not in respiratory distress Neuro: A&Ox4, following commands easily Psych: appropriately answers all questions  Imaging: I personally reviewed recent imaging.   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  During hospitalization patient was found to have a right upper lobe mass with diffuse bony metastatic disease throughout the abdomen and pelvis with a destructive soft tissue mesh in the right L1 pedicle causing significant spinal cord and neural foraminal narrowing.  Oncology and radiation oncology consulted for recommendations.  Palliative medicine team consulted to assist with complex medical decision making.  Recommendations/Plan: # Complex medical decision making/goals of care:  - Discussed care with patient as detailed above in HPI.  Patient is not planning to seek further cancer therapies.  Patient noted she is "at peace" with her death when the time comes.  Patient now may have offer for rehab bed.  Awaiting insurance authorization regarding this.  If patient is not excepted to rehab, may need to consider home hospice support though patient voices needing  24/7 assistance which home hospice does not provide. Palliative medicine team will continue to follow along and engage in conversations as able and appropriate.  -  Code Status:  Limited: Do not attempt resuscitation (DNR) -DNR-LIMITED -Do Not Intubate/DNI   # Symptom management: Patient is receiving these palliative interventions for symptom management with an intent to improve quality of life.   - Pain, in setting of metastatic cancer   - Continue as needed oxycodone  5 mg every 4 hours  # Psychosocial Support:  - Mother, brother  # Discharge Planning: To Be Determined  Discussed with: Patient, hospitalist  Thank you for allowing the palliative care team to participate in the care [facility].  Barnett Libel, DO Palliative Care Provider PMT # [phone]  If patient remains symptomatic despite maximum doses, please call PMT at [phone] between 0700 and 1900. Outside of these hours, please call attending, as PMT does not have night coverage.  Personally spent 35 minutes in patient care including extensive chart review (labs, imaging, progress/consult notes, vital signs), medically appropraite exam, discussed with treatment team, education to patient, family, and staff, documenting clinical information, medication review and management, coordination of care, and available advanced directive documents.

## 2024-05-31 NOTE — Progress Notes (Signed)
 PROGRESS NOTE Angela Lin  ZOX:[id number] DOB: 1961/06/14 DOA: 05/22/2024 PCP: Helyn Lobstein, MD  Brief Narrative/Hospital Course: 63 y.o. 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  Patient follows with Dr. Gudena. Patient had outpatient labs performed revealing severe hypercalcemia patient was instructed to come to the [facility] Emergency Department for evaluation. in ED:CT imaging of the chest abdomen and pelvis was performed revealing a right upper lobe mass measuring 5.8 x 5.7 cm in addition to diffuse bony metastatic disease throughout the abdomen and pelvis with a destructive soft tissue mass in the right L1 pedicle causing significant spinal canal and neuroforaminal narrowing.  Case was discussed with Dr. Larrie Po of neurosurgery who recommended oncology and radiation oncology consultations for potential management.  Patient was initiated on intravenous fluids for the severe hypercalcemia and the hospitalist group was then called to assess the patient for admission to the hospital. Patient treated with IVF, bisphosphonate, calcitonin.   She's now s/p bronchoscopy.  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   She's not interested in treatment per discussion with her oncology (Dr. Lee Public) 6/2. At this time patient is waiting for skilled nursing facility  Subjective: Patient seen and examined  She is alert awake oriented resting comfortably pain is controlled no new complaints  No new complaints  Assessment and plan:  Metastatic breast cancer with lung and bone mets Hypercalcemia of Malignancy Goals of care:: She has Right Upper Lobe Mass, Mediastinal Adenopathy Multifocal Osseous Metastatic Disease Hypodensities in  the liver concerning for metastatic disease. S/p calcitonin and zometa  - ca better. 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 Radiation onc planning for radiation to T7, L1, and right rib metastases (first treatment 6/2) PTOT consult done pending SNF bed offers.  Hx Breast Cancer with Lung and Bone Mets Follows with Dr. Lee Public. Workup for malignancy as noted above   AKI Hypokalemia Resolved   Hypomagnesemia : Continue Mag-Ox.     Pathologic Pubic Rami Fractures: WBAT, cont Non op management per orth (5/28 note from Dorthy Gavia)   Hypertension: BP stable, continue Amlodipine , metoprolol     Leukocytosis Related to steroids     DVT prophylaxis: enoxaparin (LOVENOX) injection 40 mg Start: 05/31/24 1215 Place and maintain sequential compression device Start: 05/28/24 1558 Code Status:   Code Status: Limited: Do not attempt resuscitation (DNR) -DNR-LIMITED -Do Not Intubate/DNI  Family Communication: plan of care discussed with patient at bedside. Patient status is: Remains hospitalized because of severity of illness Level of care: Progressive   Dispo: The patient is from: home            Anticipated disposition: Awaiting skilled nursing facility.  She is medically stable for discharge Objective: Vitals last 24 hrs: Vitals:   05/30/24 1456 05/30/24 2140 05/31/24 0413 05/31/24 1257  BP: (!) 104/92 128/69 114/60 109/63  Pulse: 66 75 70 70  Resp:  18 18 20   Temp: 98.3 F (36.8 C) 98.2 F (36.8 C) 98.6 F (37 C) 98.1 F (36.7 C)  TempSrc: Oral Oral Oral Oral  SpO2: 99% 98% 98% 100%  Weight:      Height:  Physical Examination: General exam: alert awake, oriented at baseline, older than stated age HEENT:Oral mucosa moist, Ear/Nose WNL  grossly Respiratory system: Bilaterally clear BS,no use of accessory muscle Cardiovascular system: S1 & S2 +, No JVD. Gastrointestinal system: Abdomen soft,NT,ND, BS+ Nervous System: Alert, awake, moving all extremities,and following commands. Extremities: LE edema neg,distal peripheral pulses palpable and warm.  Skin: No rashes,no icterus. MSK: Normal muscle bulk,tone, power   Data Reviewed: I have personally reviewed following labs and imaging studies ( see epic result tab) CBC: Recent Labs  Lab 05/25/24 0359 05/26/24 0425 05/27/24 0343 05/28/24 0346 05/29/24 0416  WBC 10.3 14.8* 14.4* 15.2* 18.6*  NEUTROABS 9.5* 13.4* 12.5* 12.6*  --   HGB 10.8* 10.2* 10.6* 10.8* 10.9*  HCT 32.1* 30.9* 31.7* 31.7* 31.6*  MCV 86.5 88.0 86.1 86.8 85.2  PLT 261 304 305 277 239   CMP: Recent Labs  Lab 05/25/24 0359 05/26/24 0425 05/27/24 0343 05/28/24 0346 05/29/24 0416 05/30/24 0906 05/31/24 0424  NA 135 135 136 133* 133* 133* 133*  K 3.5 3.5 3.2* 3.3* 3.5 2.8* 3.5  CL 106 108 106 104 104 99 103  CO2 21* 21* 20* 21* 21* 22 21*  GLUCOSE 119* 119* 125* 119* 122* 181* 129*  BUN 23 23 22 20 22 21 20   CREATININE 1.82* 1.41* 1.48* 1.15* 0.87 1.13* 1.13*  CALCIUM  11.0* 9.5 9.2 8.5* 8.6* 7.7* 7.4*  MG 1.2* 2.2  --  1.8 1.7  --  1.6*  PHOS  --   --  1.7*  --  1.7* 2.8 3.1   GFR: Estimated Creatinine Clearance: 46.4 mL/min (A) (by C-G formula based on SCr of 1.13 mg/dL (H)). Recent Labs  Lab 05/25/24 0359 05/26/24 0425 05/27/24 0343 05/28/24 0346  AST 17 16  --  15  ALT 19 19  --  20  ALKPHOS 122 119  --  128*  BILITOT 0.6 0.3  --  0.6  PROT 6.3* 6.0*  --  6.4*  ALBUMIN 2.5* 2.6* 2.8* 2.8*   No results for input(s): "LIPASE", "AMYLASE" in the last 168 hours. No results for input(s): "AMMONIA" in the last 168 hours. Coagulation Profile:  No results for input(s): "INR", "PROTIME" in the last 168 hours.  Unresulted Labs (From admission, onward)    None       Antimicrobials/Microbiology: Anti-infectives (From admission, onward)    None      No results found for: "SDES", "SPECREQUEST", "CULT", "REPTSTATUS"  Procedures: Procedure(s) (LRB): BRONCHOSCOPY, WITH EBUS (Bilateral) VIDEO BRONCHOSCOPY WITH ENDOBRONCHIAL NAVIGATION (Right) VIDEO BRONCHOSCOPY WITH RADIAL ENDOBRONCHIAL ULTRASOUND BRONCHOSCOPY, WITH NEEDLE ASPIRATION BIOPSY BRONCHOSCOPY, WITH BRUSH BIOPSY BRONCHOSCOPY, WITH BIOPSY IRRIGATION, BRONCHUS Medications reviewed:  Scheduled Meds:  amLODipine   5 mg Oral Daily   atorvastatin   10 mg Oral Daily   dexamethasone   2 mg Oral BID   enoxaparin (LOVENOX) injection  40 mg Subcutaneous Daily   feeding supplement  237 mL Oral BID BM   letrozole   2.5 mg Oral Daily   magnesium  oxide  400 mg Oral BID   metoprolol  succinate  150 mg Oral Daily   oxyCODONE   5 mg Oral BID   senna  2 tablet Oral QHS   Continuous Infusions:    Lesa Rape, MD [facility] 05/31/2024, 1:10 PM

## 2024-05-31 NOTE — Progress Notes (Signed)
 Physical Therapy Treatment Patient Details Name: Angela Lin MRN: [id number] DOB: 1961-03-31 Today's Date: 05/31/2024   History of Present Illness 63 yo female admitted with abnormal labs-hypercalcemia, new R lung mass. Imaging showed multifocal bone mets throughout the ribs, spine and scapula, pathologic fracture of T7, lateral lower left ribs, inferior right scapula, L pubic ramus fx. Hx of neuroendocrine Ca-lumpectomy, met breast Ca.    PT Comments  AxO x 3 willing to participate.  Pre medicated for pain.  On phone with her bank making arrangements for her accounts to be accessible to her brother.  Pt aware her prognosis is "not good".  Taked about be cremated and having her ashes spread [place], [place] . Initially, not willing to amb in hallway but did later in session.  I think she enjoyed the company/conversation as we walked and talked.  "A lot going on right now in my head".  Unsure of her time left.  Assisted with amb around room as well as in hallway.  Pt did NOT require Physical Assist.  Pt admits to amb self to and from bathroom as needed. LPT has rec SNF however per chart review, placement may be difficult.  If D/C to home, Pt will need a RW, BSC and possible a Hospital Bed.   If plan is discharge home, recommend the following:     Can travel by private vehicle        Equipment Recommendations  Rolling walker (2 wheels);BSC/3in1;Hospital bed    Recommendations for Other Services       Precautions / Restrictions Precautions Precautions: None Restrictions Weight Bearing Restrictions Per Provider Order: No     Mobility  Bed Mobility Overal bed mobility: Modified Independent             General bed mobility comments: self able OOB and back into bged.  Pain was well controlled.    Transfers Overall transfer level: Modified independent                 General transfer comment: self able from bed and the recliner.     Ambulation/Gait Ambulation/Gait assistance: Supervision Gait Distance (Feet): 75 Feet Assistive device: Rolling walker (2 wheels) Gait Pattern/deviations: Step-through pattern, Decreased stride length Gait velocity: decreased     General Gait Details: Pt tolerated a functional distance with light lean on walker for support.  No dizziness. Pain well controlled.   Stairs             Wheelchair Mobility     Tilt Bed    Modified Rankin (Stroke Patients Only)       Balance                                            Communication Communication Communication: No apparent difficulties  Cognition Arousal: Alert Behavior During Therapy: WFL for tasks assessed/performed   PT - Cognitive impairments: No apparent impairments                       PT - Cognition Comments: AxO x 3 willing to participate.  Pre medicated for pain.  On phone with her bank making arrangements for her accounts to be accessible to her brother.  Pt aware her prognosis is "not good".  Taked about be cremated and having her ashes spread [place] the [place], [place] . Initially, not willing to amb in  hallway but did later in session.  I think she enjoyed the company/conversation as we walked and talked.  "A lot going on right now in my head".  Unsure of her time left. Following commands: Intact      Cueing Cueing Techniques: Verbal cues  Exercises      General Comments        Pertinent Vitals/Pain Pain Assessment Pain Assessment: Faces Faces Pain Scale: Hurts a little bit Pain Location: L lower back/hip Pain Descriptors / Indicators: Aching Pain Intervention(s): Monitored during session, Premedicated before session    Home Living                          Prior Function            PT Goals (current goals can now be found in the care plan section) Progress towards PT goals: Progressing toward goals    Frequency    Min 3X/week      PT Plan       Co-evaluation              AM-PAC PT "6 Clicks" Mobility   Outcome Measure  Help needed turning from your back to your side while in a flat bed without using bedrails?: None Help needed moving from lying on your back to sitting on the side of a flat bed without using bedrails?: None Help needed moving to and from a bed to a chair (including a wheelchair)?: None Help needed standing up from a chair using your arms (e.g., wheelchair or bedside chair)?: None Help needed to walk in hospital room?: None Help needed climbing 3-5 steps with a railing? : A Little 6 Click Score: 23    End of Session Equipment Utilized During Treatment: Gait belt Activity Tolerance: Patient tolerated treatment well;Patient limited by pain Patient left: with call bell/phone within reach;in bed Nurse Communication: Mobility status PT Visit Diagnosis: Muscle weakness (generalized) (M62.81);Pain;Difficulty in walking, not elsewhere classified (R26.2)     Time: 4098-1191 PT Time Calculation (min) (ACUTE ONLY): 16 min  Charges:    $Gait Training: 8-22 mins PT General Charges $$ ACUTE PT VISIT: 1 Visit                    Bess Broody  PTA Acute  Rehabilitation Services Office M-F          [phone]

## 2024-06-01 ENCOUNTER — Ambulatory Visit

## 2024-06-01 DIAGNOSIS — M84454A Pathological fracture, pelvis, initial encounter for fracture: Secondary | ICD-10-CM | POA: Diagnosis not present

## 2024-06-01 DIAGNOSIS — R918 Other nonspecific abnormal finding of lung field: Secondary | ICD-10-CM | POA: Diagnosis not present

## 2024-06-01 DIAGNOSIS — M8440XA Pathological fracture, unspecified site, initial encounter for fracture: Secondary | ICD-10-CM

## 2024-06-01 DIAGNOSIS — Z515 Encounter for palliative care: Secondary | ICD-10-CM | POA: Diagnosis not present

## 2024-06-01 DIAGNOSIS — M8448XA Pathological fracture, other site, initial encounter for fracture: Secondary | ICD-10-CM | POA: Diagnosis not present

## 2024-06-01 NOTE — Progress Notes (Signed)
 Patient transferred to 5W Room 1522 under care of [place], [place]. All personal belongings forwarded with patient. Brother at bedside and aware of transfer. Patient was medicated with PRN Oxycodone  per her request prior to leaving unit.

## 2024-06-01 NOTE — Plan of Care (Signed)

## 2024-06-01 NOTE — TOC Progression Note (Signed)
 Transition of Care [facility]) - Progression Note    Patient Details  Name: Messina Kosinski MRN: [id number] Date of Birth: 01-Jul-1961  Transition of Care [facility]) CM/SW Contact  Levie Ream, RN Phone Number: 06/01/2024, 5:05 PM  Clinical Narrative:    Notified by Simona Dublin, LCSW that [facility] has started ins auth, and requests peer to peer to be completed within 14 days per [place]; POC [phone]; Dr Lesa Rape notified via secure chat; [facility] is following   Expected Discharge Plan:  (TBD) Barriers to Discharge: Continued Medical Work up  Expected Discharge Plan and Services       Living arrangements for the past 2 months: Single Family Home                                       Social Determinants of Health (SDOH) Interventions SDOH Screenings   Food Insecurity: No Food Insecurity (05/22/2024)  Housing: Low Risk  (05/22/2024)  Transportation Needs: No Transportation Needs (05/22/2024)  Utilities: Not At Risk (05/22/2024)  Tobacco Use: High Risk (05/24/2024)    Readmission Risk Interventions    05/26/2024    4:01 PM  Readmission Risk Prevention Plan  Post Dischage Appt Complete  Medication Screening Complete  Transportation Screening Complete

## 2024-06-01 NOTE — Progress Notes (Signed)
 Daily Progress Note   Patient Name: Angela Lin       Date: 06/01/2024 DOB: 09-Jan-1961  Age: 63 y.o. MRN#: [id number] Attending Physician: Lesa Rape, MD Primary Care Physician: Helyn Lobstein, MD Admit Date: 05/22/2024 Length of Stay: 10 days  Reason for Consultation/Follow-up: Establishing goals of care  Subjective:   CC: Patient sitting on side of bed.  Following up regarding complex medical decision making.  Subjective:  Reviewed EMR prior to presenting to bedside.  Reviewed recent documentation from [facility] and hospitalist.  Presented to bedside in afternoon.  Patient attempting to work with tech on hygiene care.  Patient feels symptoms currently managed at this time.  Awaiting insurance authorization regarding patient going to rehab.  Spent time providing emotional support via active listening.  Discussed planning to assist with patient's quality of life with forward.  All questions answered at that time.  Noted palliative medicine team to continue to follow with patient's medical journey.  Review of Systems Feels pain is controlled with oxycodone  as needed Objective:   Vital Signs:  BP 131/69 (BP Location: Right Arm)   Pulse 64   Temp 98.4 F (36.9 C) (Oral)   Resp 16   Ht 5\' 5"  (1.651 m)   Wt 68 kg   LMP 08/27/2008 (Approximate)   SpO2 93%   BMI 24.95 kg/m   Physical Exam: General: NAD, alert, pleasant Cardiovascular: RRR Respiratory: no increased work of breathing noted, not in respiratory distress Neuro: A&Ox4, following commands easily Psych: appropriately answers all questions  Imaging: I personally reviewed recent imaging.   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  During hospitalization patient was found to have a right upper lobe mass with diffuse bony metastatic disease throughout the abdomen and pelvis with a destructive soft tissue mesh in the right L1 pedicle causing significant spinal cord and neural foraminal narrowing.  Oncology and radiation oncology consulted for recommendations.  Palliative medicine team consulted to assist with complex medical decision making.  Recommendations/Plan: # Complex medical decision making/goals of care:  - Discussed care with patient as detailed above in HPI.  Patient is not planning to seek further cancer therapies.  Patient noted she is "at peace" with her death when the time comes.  Patient now may have offer for rehab bed.  Awaiting insurance authorization regarding this.  If patient is not excepted to rehab, may need to consider home hospice support though patient voices needing 24/7 assistance which home hospice does not provide. Palliative medicine team will continue to follow along and engage in conversations as able and appropriate.  -  Code Status: Limited: Do not attempt resuscitation (DNR) -DNR-LIMITED -Do Not Intubate/DNI   # Symptom management: Patient is receiving these palliative interventions for symptom management with an intent to improve quality of life.   - Pain, in setting of metastatic cancer   - Continue as needed oxycodone  5 mg every 4 hours  # Psychosocial Support:  - Mother, brother  # Discharge Planning: To Be Determined  Discussed with: Patient, hospitalist  Thank you for allowing the palliative care team to participate in the care [facility].  Barnett Libel, DO Palliative Care Provider PMT # [phone]  If  patient remains symptomatic despite maximum doses, please call PMT at [phone] between 0700 and 1900. Outside of these hours, please call attending, as PMT does not have night coverage.

## 2024-06-01 NOTE — Plan of Care (Signed)
  Problem: Clinical Measurements: Goal: Respiratory complications will improve Outcome: Progressing   Problem: Clinical Measurements: Goal: Cardiovascular complication will be avoided Outcome: Progressing   Problem: Activity: Goal: Risk for activity intolerance will decrease Outcome: Progressing   Problem: Education: Goal: Knowledge of General Education information will improve Description: Including pain rating scale, medication(s)/side effects and non-pharmacologic comfort measures Outcome: Progressing

## 2024-06-01 NOTE — TOC Progression Note (Signed)
 Transition of Care [facility]) - Progression Note    Patient Details  Name: Angela Lin MRN: [id number] Date of Birth: 16-Oct-1961  Transition of Care [facility]) CM/SW Contact  Gertha Ku, LCSW Phone Number: 06/01/2024, 9:47 AM  Clinical Narrative:     Insurance auth still pending. TOC to follow.    Expected Discharge Plan:  (TBD) Barriers to Discharge: Continued Medical Work up  Expected Discharge Plan and Services       Living arrangements for the past 2 months: Single Family Home                                       Social Determinants of Health (SDOH) Interventions SDOH Screenings   Food Insecurity: No Food Insecurity (05/22/2024)  Housing: Low Risk  (05/22/2024)  Transportation Needs: No Transportation Needs (05/22/2024)  Utilities: Not At Risk (05/22/2024)  Tobacco Use: High Risk (05/24/2024)    Readmission Risk Interventions    05/26/2024    4:01 PM  Readmission Risk Prevention Plan  Post Dischage Appt Complete  Medication Screening Complete  Transportation Screening Complete

## 2024-06-01 NOTE — Progress Notes (Signed)
 PROGRESS NOTE Shandrea Lusk Hayter  OJJ:[id number] DOB: 03/07/1961 DOA: 05/22/2024 PCP: Helyn Lobstein, MD  Brief Narrative/Hospital Course: 63 y.o. 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  Patient follows with Dr. Gudena. Patient had outpatient labs performed revealing severe hypercalcemia patient was instructed to come to the [facility] Emergency Department for evaluation. in ED:CT imaging of the chest abdomen and pelvis was performed revealing a right upper lobe mass measuring 5.8 x 5.7 cm in addition to diffuse bony metastatic disease throughout the abdomen and pelvis with a destructive soft tissue mass in the right L1 pedicle causing significant spinal canal and neuroforaminal narrowing.  Case was discussed with Dr. Larrie Po of neurosurgery who recommended oncology and radiation oncology consultations for potential management.  Patient was initiated on intravenous fluids for the severe hypercalcemia and the hospitalist group was then called to assess the patient for admission to the hospital. Patient treated with IVF, bisphosphonate, calcitonin.   She's now s/p bronchoscopy.  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   She's not interested in treatment per discussion with her oncology (Dr. Lee Public) 6/2. 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 S/p calcitonin and zometa  - ca better. 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 Radiation onc planning for radiation to T7, L1, and right rib metastases (first treatment 6/2) PTOT consult done pending SNF bed offers.  Hx Breast Cancer with Lung and Bone Mets Follows with Dr. Lee Public. Workup for malignancy as noted above   AKI Hypokalemia Resolved   Hypomagnesemia : Continue Mag-Ox.     Pathologic Pubic Rami Fractures: WBAT, cont Non op management per orth (5/28 note from Dorthy Gavia)   Hypertension: BP stable, continue Amlodipine , metoprolol     Leukocytosis Related to steroids     DVT prophylaxis: enoxaparin (LOVENOX) injection 40 mg Start: 05/31/24 1215 Place and maintain sequential compression device Start: 05/28/24 1558 Code Status:   Code Status: Limited: Do not attempt resuscitation (DNR) -DNR-LIMITED -Do Not Intubate/DNI  Family Communication: plan of care discussed with patient at bedside. Patient status is: Remains hospitalized because of severity of illness Level of care: Progressive   Dispo: The patient is from: home            Anticipated disposition: Awaiting skilled nursing facility.  She is medically stable for discharge Objective: Vitals last 24 hrs: Vitals:   05/31/24 0413 05/31/24 1257 05/31/24 2011 06/01/24 0158  BP: 114/60 109/63 (!) 112/56 131/69  Pulse: 70 70 65 64  Resp: 18 20 15 16   Temp: 98.6 F (37 C) 98.1 F (36.7 C) 97.8 F (36.6 C) 98.4 F (36.9 C)  TempSrc: Oral Oral Oral Oral  SpO2: 98% 100% 97% 93%  Weight:      Height:        Physical Examination:  General exam: alert awake, oriented  HEENT:Oral mucosa moist, Ear/Nose WNL grossly Respiratory system: Bilaterally clear BS,no use  of accessory muscle Cardiovascular system: S1 & S2 +, No JVD. Gastrointestinal system: Abdomen soft,NT,ND, BS+ Nervous System: Alert, awake, moving all extremities,and following commands. Extremities: LE edema neg,distal peripheral pulses palpable and warm.  Skin: No rashes,no icterus. MSK: Normal muscle bulk,tone, power    Data Reviewed: I have personally reviewed following labs and imaging studies ( see epic result tab) CBC: Recent Labs  Lab 05/26/24 0425 05/27/24 0343 05/28/24 0346 05/29/24 0416  WBC 14.8* 14.4* 15.2* 18.6*  NEUTROABS 13.4* 12.5* 12.6*  --   HGB 10.2* 10.6* 10.8* 10.9*  HCT 30.9* 31.7* 31.7* 31.6*  MCV 88.0 86.1 86.8 85.2  PLT 304 305 277 239   CMP: Recent Labs  Lab 05/26/24 0425 05/27/24 0343 05/28/24 0346 05/29/24 0416 05/30/24 0906 05/31/24 0424  NA 135 136 133* 133* 133* 133*  K 3.5 3.2* 3.3* 3.5 2.8* 3.5  CL 108 106 104 104 99 103  CO2 21* 20* 21* 21* 22 21*  GLUCOSE 119* 125* 119* 122* 181* 129*  BUN 23 22 20 22 21 20   CREATININE 1.41* 1.48* 1.15* 0.87 1.13* 1.13*  CALCIUM  9.5 9.2 8.5* 8.6* 7.7* 7.4*  MG 2.2  --  1.8 1.7  --  1.6*  PHOS  --  1.7*  --  1.7* 2.8 3.1   GFR: Estimated Creatinine Clearance: 46.4 mL/min (A) (by C-G formula based on SCr of 1.13 mg/dL (H)). Recent Labs  Lab 05/26/24 0425 05/27/24 0343 05/28/24 0346  AST 16  --  15  ALT 19  --  20  ALKPHOS 119  --  128*  BILITOT 0.3  --  0.6  PROT 6.0*  --  6.4*  ALBUMIN 2.6* 2.8* 2.8*   No results for input(s): "LIPASE", "AMYLASE" in the last 168 hours. No results for input(s): "AMMONIA" in the last 168 hours. Coagulation Profile:  No results for input(s): "INR", "PROTIME" in the last 168 hours.  Unresulted Labs (From admission, onward)    None      Antimicrobials/Microbiology: Anti-infectives (From admission, onward)    None      No results found for: "SDES", "SPECREQUEST", "CULT", "REPTSTATUS"  Procedures: Procedure(s) (LRB): BRONCHOSCOPY, WITH EBUS  (Bilateral) VIDEO BRONCHOSCOPY WITH ENDOBRONCHIAL NAVIGATION (Right) VIDEO BRONCHOSCOPY WITH RADIAL ENDOBRONCHIAL ULTRASOUND BRONCHOSCOPY, WITH NEEDLE ASPIRATION BIOPSY BRONCHOSCOPY, WITH BRUSH BIOPSY BRONCHOSCOPY, WITH BIOPSY IRRIGATION, BRONCHUS Medications reviewed:  Scheduled Meds:  amLODipine   5 mg Oral Daily   dexamethasone   2 mg Oral BID   enoxaparin (LOVENOX) injection  40 mg Subcutaneous Daily   letrozole   2.5 mg Oral Daily   magnesium  oxide  400 mg Oral BID   metoprolol  succinate  150 mg Oral Daily   oxyCODONE   5 mg Oral BID   senna  2 tablet Oral QHS   Continuous Infusions:    Lesa Rape, MD [facility] 06/01/2024, 11:00 AM

## 2024-06-02 DIAGNOSIS — Z515 Encounter for palliative care: Secondary | ICD-10-CM | POA: Diagnosis not present

## 2024-06-02 DIAGNOSIS — M8448XA Pathological fracture, other site, initial encounter for fracture: Secondary | ICD-10-CM | POA: Diagnosis not present

## 2024-06-02 DIAGNOSIS — Z79899 Other long term (current) drug therapy: Secondary | ICD-10-CM

## 2024-06-02 DIAGNOSIS — M84454A Pathological fracture, pelvis, initial encounter for fracture: Secondary | ICD-10-CM | POA: Diagnosis not present

## 2024-06-02 DIAGNOSIS — R4589 Other symptoms and signs involving emotional state: Secondary | ICD-10-CM | POA: Diagnosis not present

## 2024-06-02 MED ORDER — OXYCODONE HCL ER 10 MG PO T12A
10.0000 mg | EXTENDED_RELEASE_TABLET | Freq: Two times a day (BID) | ORAL | Status: DC
  Administered 2024-06-02 – 2024-06-09 (×14): 10 mg via ORAL
  Filled 2024-06-02 (×14): qty 1

## 2024-06-02 MED ORDER — MAGNESIUM SULFATE 2 GM/50ML IV SOLN
2.0000 g | Freq: Once | INTRAVENOUS | Status: AC
  Administered 2024-06-02: 2 g via INTRAVENOUS
  Filled 2024-06-02: qty 50

## 2024-06-02 NOTE — TOC Progression Note (Signed)
 Transition of Care [facility]) - Progression Note    Patient Details  Name: Angela Lin MRN: [id number] Date of Birth: [birth year]  Transition of Care [facility]) CM/SW Contact  Levie Ream, RN Phone Number: 06/02/2024, 10:37 AM  Clinical Narrative:    Dr Alla Isaacs notified peer to peer requested per [facility]; POC [phone]; will pass onto oncoming TOC for follow up tomorrow.     Expected Discharge Plan:  (TBD) Barriers to Discharge: Continued Medical Work up  Expected Discharge Plan and Services       Living arrangements for the past 2 months: Single Family Home                                       Social Determinants of Health (SDOH) Interventions SDOH Screenings   Food Insecurity: No Food Insecurity (05/22/2024)  Housing: Low Risk  (05/22/2024)  Transportation Needs: No Transportation Needs (05/22/2024)  Utilities: Not At Risk (05/22/2024)  Tobacco Use: High Risk (05/24/2024)    Readmission Risk Interventions    05/26/2024    4:01 PM  Readmission Risk Prevention Plan  Post Dischage Appt Complete  Medication Screening Complete  Transportation Screening Complete

## 2024-06-02 NOTE — Progress Notes (Signed)
 PROGRESS NOTE    Angela Lin  ZOX:[id number] DOB: 1961-07-15 DOA: 05/22/2024 PCP: Helyn Lobstein, MD  Brief Narrative: 63 y.o. 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  Patient follows with Dr. Gudena. Patient had outpatient labs performed revealing severe hypercalcemia patient was instructed to come to the [facility] Emergency Department for evaluation. in ED:CT imaging of the chest abdomen and pelvis was performed revealing a right upper lobe mass measuring 5.8 x 5.7 cm in addition to diffuse bony metastatic disease throughout the abdomen and pelvis with a destructive soft tissue mass in the right L1 pedicle causing significant spinal canal and neuroforaminal narrowing.  Case was discussed with Dr. Larrie Po of neurosurgery who recommended oncology and radiation oncology consultations for potential management.  Patient was initiated on intravenous fluids for the severe hypercalcemia and the hospitalist group was then called to assess the patient for admission to the hospital. Patient treated with IVF, bisphosphonate, calcitonin.   She's now s/p bronchoscopy.  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   She's not interested in treatment per discussion with her oncology (Dr. Lee Public) 6/2. At this time patient is waiting for skilled nursing facility  Assessment & Plan:   Principal Problem:   Hypercalcemia of malignancy Active Problems:   Other malignant neuroendocrine tumors (HCC)   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)   Essential hypertension   AKI (acute kidney injury) (HCC)   Hypokalemia   Mass of upper lobe of right lung   Pathological fracture, pelvis, initial encounter for fracture    Pathologic fracture of lumbar vertebra, initial encounter   Goals of care, counseling/discussion   Hypophosphatemia   DNR (do not resuscitate)   Cancer associated pain   Need for emotional support   Counseling and coordination of care   Metastatic malignant neoplasm (HCC)   Lung mass   Palliative care encounter   Multiple pathological fractures   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 S/p calcitonin and zometa  - ca better. 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 Radiation onc planning for radiation to T7, L1, and right rib metastases (first treatment 6/2) PTOT consult done pending SNF bed offers.   Hx Breast Cancer with Lung and Bone Mets Follows with Dr. Lee Public. Workup for malignancy as noted above   AKI Hypokalemia Resolved    Hypomagnesemia : Continue Mag-Ox.     Pathologic Pubic Rami Fractures: WBAT, cont Non op management per orth (5/28 note from Dorthy Gavia)   Hypertension: BP stable, continue Amlodipine , metoprolol     Leukocytosis Related to steroids      Estimated body mass index is 24.95 kg/m as calculated from the following:   Height as of this encounter: 5\' 5"  (1.651 m).   Weight as of this encounter: 68 kg.  DVT prophylaxis: Lovenox  Code Status: DNR limited Family Communication: None at bedside Disposition Plan:  Status is: Inpatient Remains inpatient appropriate because: Wait SNF   Consultants: Palliative care  Subjective:  Patient is resting in bed she offers no complaints Calcium  7.4 creatinine 1.13 I  called the [phone] the are closed  Objective: Vitals:   06/01/24 1320 06/01/24 1742 06/02/24 0500 06/02/24 1022  BP: 125/67 (!) 128/52 126/62 120/61  Pulse: 69 (!) 59 69 60  Resp: 16 18 17    Temp: 98.2 F (36.8 C) 98 F (36.7 C) 98.2 F (36.8 C) 98.2 F (36.8 C)  TempSrc: Oral Oral  Oral  SpO2: 100% 98% 97% 98%  Weight:      Height:        Intake/Output Summary (Last 24 hours) at 06/02/2024 1323 Last data filed at 06/02/2024 0805 Gross per 24 hour  Intake 200 ml  Output --  Net 200 ml   Filed Weights   05/22/24 2300  Weight: 68 kg    Examination:  General exam: Appears in no acute distress  respiratory system: Clear to auscultation. Respiratory effort normal. Cardiovascular system: S1 & S2 heard, RRR. No JVD, murmurs, rubs, gallops or clicks. No pedal edema. Gastrointestinal system: Abdomen is nondistended, soft and nontender. No organomegaly or masses felt. Normal bowel sounds heard. Central nervous system: Alert and oriented.  Extremities: No edema   Data Reviewed: I have personally reviewed following labs and imaging studies  CBC: Recent Labs  Lab 05/27/24 0343 05/28/24 0346 05/29/24 0416  WBC 14.4* 15.2* 18.6*  NEUTROABS 12.5* 12.6*  --   HGB 10.6* 10.8* 10.9*  HCT 31.7* 31.7* 31.6*  MCV 86.1 86.8 85.2  PLT 305 277 239   Basic Metabolic Panel: Recent Labs  Lab 05/27/24 0343 05/28/24 0346 05/29/24 0416 05/30/24 0906 05/31/24 0424  NA 136 133* 133* 133* 133*  K 3.2* 3.3* 3.5 2.8* 3.5  CL 106 104 104 99 103  CO2 20* 21* 21* 22 21*  GLUCOSE 125* 119* 122* 181* 129*  BUN 22 20 22 21 20   CREATININE 1.48* 1.15* 0.87 1.13* 1.13*  CALCIUM  9.2 8.5* 8.6* 7.7* 7.4*  MG  --  1.8 1.7  --  1.6*  PHOS 1.7*  --  1.7* 2.8 3.1   GFR: Estimated Creatinine Clearance: 46.4 mL/min (A) (by C-G formula based on SCr of 1.13 mg/dL (H)). Liver Function Tests: Recent Labs  Lab 05/27/24 0343 05/28/24 0346  AST  --  15  ALT  --  20  ALKPHOS  --  128*  BILITOT   --  0.6  PROT  --  6.4*  ALBUMIN 2.8* 2.8*   No results for input(s): "LIPASE", "AMYLASE" in the last 168 hours. No results for input(s): "AMMONIA" in the last 168 hours. Coagulation Profile: No results for input(s): "INR", "PROTIME" in the last 168 hours. Cardiac Enzymes: No results for input(s): "CKTOTAL", "CKMB", "CKMBINDEX", "TROPONINI" in the last 168 hours. BNP (last 3 results) No results for input(s): "PROBNP" in the last 8760 hours. HbA1C: No results for input(s): "HGBA1C" in the last 72 hours. CBG: No results for input(s): "GLUCAP" in the last 168 hours. Lipid Profile: No results for input(s): "CHOL", "HDL", "LDLCALC", "TRIG", "CHOLHDL", "LDLDIRECT" in the last 72 hours. Thyroid Function Tests: No results for input(s): "TSH", "T4TOTAL", "FREET4", "T3FREE", "THYROIDAB" in the last 72 hours. Anemia Panel: No results for input(s): "VITAMINB12", "FOLATE", "FERRITIN", "TIBC", "IRON", "RETICCTPCT" in the last 72 hours. Sepsis Labs: No results for input(s): "PROCALCITON", "LATICACIDVEN" in the last 168 hours.  No results found for this or any previous visit (from the past 240 hours).    Radiology Studies: No results found.  Scheduled Meds:  amLODipine   5 mg Oral Daily   dexamethasone   2 mg Oral BID   enoxaparin  (LOVENOX ) injection  40 mg Subcutaneous Daily   letrozole   2.5 mg Oral Daily   metoprolol  succinate  150 mg Oral Daily   oxyCODONE   5 mg Oral BID   senna  2 tablet Oral QHS   Continuous Infusions:   LOS: 11 days    Barbee Lew, MD  06/02/2024, 1:23 PM

## 2024-06-02 NOTE — Progress Notes (Signed)
 Daily Progress Note   Patient Name: Angela Lin       Date: 06/02/2024 DOB: 1961-11-07  Age: 63 y.o. MRN#: [id number] Attending Physician: Barbee Lew, MD Primary Care Physician: Helyn Lobstein, MD Admit Date: 05/22/2024 Length of Stay: 11 days  Reason for Consultation/Follow-up: Establishing goals of care  Subjective:   CC: Patient lying in bed.  Following up regarding complex medical decision making.  Subjective:  Reviewed EMR prior to presenting to bedside.  At time of EMR review in past 24 hours patient has required oxycodone  5 mg x 6 doses.  Reviewed recent [facility] and hospitalist note.  Reviewed recent BMP noting BUN 20 and creatinine 1.13 so GFR estimated at 55 to assist with medication management.  Presents to bedside in afternoon to meet with patient.  Patient laying in bed.  Patient able to introduce her mother who was present at bedside.  Introduced myself as a member of the palliative medicine team.  Discussed patient's symptom management today.  Patient needing more frequent as needed oxycodone .  Discussed starting long-acting medication of OxyContin  twice daily.  Patient agreeing with this plan.  Noted will continue to have as needed oxycodone  available. Spent time providing emotional support via active listening.  Noted palliative medicine team continue to follow with patient's medical journey.  All questions answered at that time.   Review of Systems Patient needing more pain medications for back as feels oxycodone  is wearing off more quickly Objective:   Vital Signs:  BP 126/62 (BP Location: Right Arm)   Pulse 69   Temp 98.2 F (36.8 C)   Resp 17   Ht 5\' 5"  (1.651 m)   Wt 68 kg   LMP 08/27/2008 (Approximate)   SpO2 97%   BMI 24.95 kg/m   Physical Exam: General: NAD, alert, pleasant Cardiovascular: RRR Respiratory: no increased work of breathing noted, not in respiratory distress Neuro: A&Ox4, following commands easily Psych: appropriately  answers all questions  Imaging: I personally reviewed recent imaging.   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  During hospitalization patient was found to have a right upper lobe mass with diffuse bony metastatic disease throughout the abdomen and pelvis with a destructive soft tissue mesh in the right L1 pedicle causing significant spinal cord and neural foraminal narrowing.  Oncology and radiation oncology consulted for recommendations.  Palliative medicine team consulted to assist with complex medical decision making.  Recommendations/Plan: # Complex medical decision making/goals of care:  - Discussed care with patient as detailed above in HPI.  Patient is not planning to seek further cancer therapies.  Patient noted she is "at peace" with her death when the time comes.  Patient now may have offer for rehab bed.  Awaiting insurance authorization regarding this.  If patient is not accepted to rehab, may need to consider home hospice support though patient voices needing 24/7 assistance which home hospice does not provide. Palliative medicine team will continue to follow along and engage in conversations as able and appropriate.  -  Code Status: Limited: Do not attempt resuscitation (DNR) -DNR-LIMITED -Do Not Intubate/DNI   # Symptom management: Patient is receiving these palliative interventions for symptom management with an intent to improve  quality of life.   - Pain, in setting of metastatic cancer   - Start OxyContin  10 mg every 12 hours during the day.  First dose at 2200 on 06/02/2024.   - Continue as needed oxycodone  5 mg every 4  hours   - Discontinued scheduled oxycodone  5 mg twice daily  # Psychosocial Support:  - Mother, brother  # Discharge Planning: To Be Determined   Thank you for allowing the palliative care team to participate in the care [facility].  Barnett Libel, DO Palliative Care Provider PMT # [phone]  If patient remains symptomatic despite maximum doses, please call PMT at [phone] between 0700 and 1900. Outside of these hours, please call attending, as PMT does not have night coverage.

## 2024-06-02 NOTE — Plan of Care (Signed)

## 2024-06-03 DIAGNOSIS — M84454A Pathological fracture, pelvis, initial encounter for fracture: Secondary | ICD-10-CM | POA: Diagnosis not present

## 2024-06-03 DIAGNOSIS — M8448XA Pathological fracture, other site, initial encounter for fracture: Secondary | ICD-10-CM | POA: Diagnosis not present

## 2024-06-03 DIAGNOSIS — Z515 Encounter for palliative care: Secondary | ICD-10-CM | POA: Diagnosis not present

## 2024-06-03 DIAGNOSIS — R4589 Other symptoms and signs involving emotional state: Secondary | ICD-10-CM | POA: Diagnosis not present

## 2024-06-03 NOTE — Progress Notes (Addendum)
 Daily Progress Note   Patient Name: Angela Lin       Date: 06/03/2024 DOB: 05-26-1961  Age: 63 y.o. MRN#: [id number] Attending Physician: Lesa Rape, MD Primary Care Physician: Helyn Lobstein, MD Admit Date: 05/22/2024 Length of Stay: 12 days  Reason for Consultation/Follow-up: Establishing goals of care  Subjective:   CC: Patient lying in bed.  Following up regarding complex medical decision making.  Subjective:  Reviewed EMR prior to presenting to bedside.  At time of EMR review in past 24 hours patient has required oxycodone  5 mg x 2 doses.  Patient was started on OxyContin  10 mg every 12 hours in the evening on 06/02/2024. Reviewed recent documentation by hospitalist.  Presented to bedside to see patient.  No visitors present at bedside currently.  Patient noted that her pain is overall better controlled with addition of OxyContin .  Noted patient still has as needed oxycodone  available if needed.  Encourage patient that pain medication is to continue enjoying quality of life as able. Also discussed with patient use of Lovenox .  Reviewed risk and benefits of this medication.  Patient does not want to receive Lovenox  injections any further as they cause her discomfort.  Patient is accepting of risks associated with not receiving Lovenox .  Have discontinued Lovenox  as per patient's request.  Patient awaiting outcome of peer to peer discussion about possible rehab.  Hospitalist has been attempting to reach phone number for peer to peer without success.  Noted would likely occur on Monday when office opens.  If patient does not go to rehab, would recommend hospice support.  Hopeful patient can maintain and regain some strength and enjoy quality time moving forward.  Spent time providing emotional support via active listening.  Noted palliative medicine team to continue following patient's medical journey.   Review of Systems Patient pain better controlled Objective:   Vital Signs:   BP 122/69   Pulse 63   Temp 98.4 F (36.9 C) (Oral)   Resp 18   Ht 5\' 5"  (1.651 m)   Wt 68 kg   LMP 08/27/2008 (Approximate)   SpO2 96%   BMI 24.95 kg/m   Physical Exam: General: NAD, alert, pleasant Cardiovascular: RRR Respiratory: no increased work of breathing noted, not in respiratory distress Neuro: A&Ox4, following commands easily Psych: appropriately answers all questions  Imaging: I personally reviewed recent imaging.   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  During hospitalization patient was found to have a right upper lobe mass with diffuse bony metastatic disease throughout the abdomen and pelvis with a destructive soft tissue mesh in the right L1 pedicle causing significant spinal cord and neural foraminal narrowing.  Patient diagnosed with metastatic breast cancer with lung and bone metastases.  Oncology and radiation oncology consulted for recommendations.  Palliative medicine team consulted to assist with complex medical decision making.  Recommendations/Plan: # Complex medical decision making/goals of care:  - Discussed care with patient as detailed above in HPI.  Patient is not planning to seek further cancer therapies.  Patient noted she is "at peace" with her death when the time comes.  Awaiting outcome of peer to peer conversation to determine if patient can go to rehab.Aaron Aas  If patient is not accepted to rehab, may need to consider home hospice support. Palliative medicine team will continue to follow along and engage in conversations as able and appropriate.  -  Code Status: Limited: Do not attempt resuscitation  (DNR) -DNR-LIMITED -Do Not Intubate/DNI   # Symptom management: Patient is receiving these palliative interventions for symptom management with an intent to improve quality of life.   - Pain, in setting of metastatic cancer   - Continue OxyContin  10 mg every 12 hours during the day.  First dose at 2200 on 06/02/2024.   - Continue as needed oxycodone  5 mg every 4 hours  -Discontinuing Lovenox  as per patient's request that it causes her discomfort.  Reviewed risk and benefits of this medication and patient accepting of risks associated with not receiving Lovenox .  # Psychosocial Support:  - Mother, brother  # Discharge Planning: To Be Determined   Thank you for allowing the palliative care team to participate in the care [facility] [place].  Barnett Libel, DO Palliative Care Provider PMT # [phone]  If patient remains symptomatic despite maximum doses, please call PMT at [phone] between 0700 and 1900. Outside of these hours, please call attending, as PMT does not have night coverage.

## 2024-06-03 NOTE — Plan of Care (Signed)

## 2024-06-03 NOTE — Progress Notes (Signed)
 PROGRESS NOTE Angela Lin  WJX:[id number] DOB: 1961/10/11 DOA: 05/22/2024 PCP: Helyn Lobstein, MD  Brief Narrative/Hospital Course: 63 y.o. 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  Patient follows with Dr. Gudena. Patient had outpatient labs performed revealing severe hypercalcemia patient was instructed to come to the [facility] Emergency Department for evaluation. in ED:CT imaging of the chest abdomen and pelvis was performed revealing a right upper lobe mass measuring 5.8 x 5.7 cm in addition to diffuse bony metastatic disease throughout the abdomen and pelvis with a destructive soft tissue mass in the right L1 pedicle causing significant spinal canal and neuroforaminal narrowing.  Case was discussed with Dr. Larrie Po of neurosurgery who recommended oncology and radiation oncology consultations for potential management.  Patient was initiated on intravenous fluids for the severe hypercalcemia and the hospitalist group was then called to assess the patient for admission to the hospital. Patient treated with IVF, bisphosphonate, calcitonin.   She's now s/p bronchoscopy.  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   She's not interested in treatment per discussion with her oncology (Dr. Lee Public) 6/2. At this time patient is waiting for skilled nursing facility  Subjective: Seen and examined Resting well, no complaints Overnight afebrile BP stable  Last labs from 6/5 reviewed and stable  Assessment and plan:  Metastatic breast cancer with lung and bone mets Hypercalcemia of Malignancy Goals of care:: She has Right Upper Lobe Mass, Mediastinal Adenopathy Multifocal Osseous Metastatic Disease Hypodensities in the liver  concerning for metastatic disease. S/p calcitonin and zometa  - ca better. 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 Radiation onc planning for radiation to T7, L1, and right rib metastases (first treatment 6/2) PTOT consult done pending SNF bed offers. P2P review attempted 6/6 and 6/7 and [facility] been closed for the weekend  Hx Breast Cancer with Lung and Bone Mets Follows with Dr. Lee Public. Workup for malignancy as noted above   AKI Hypokalemia Resolved   Hypomagnesemia : Continue Mag-Ox.     Pathologic Pubic Rami Fractures: WBAT, cont Non op management per orth (5/28 note from Dorthy Gavia)   Hypertension: BP stable, continue Amlodipine , metoprolol     Leukocytosis Related to steroids     DVT prophylaxis: Place and maintain sequential compression device Start: 05/28/24 1558.  She has a Lovenox /chemical DVT prophylaxis-wants to continue ambulate discussed that she is high risk for DVT. Code Status:   Code Status: Limited: Do not attempt resuscitation (DNR) -DNR-LIMITED -Do Not Intubate/DNI  Family Communication: plan of care discussed with patient at bedside. Patient status is: Remains hospitalized because of severity of illness Level of care: Med-Surg   Dispo: The patient is from: home            Anticipated disposition: Awaiting SNF  Objective: Vitals last 24 hrs: Vitals:   06/02/24 1856 06/03/24 0013 06/03/24 0436 06/03/24 0931  BP: (!) 134/57 (!) 127/55 122/69 124/64  Pulse: 64 65 63 66  Resp: 18 18 18    Temp: 98.7 F (37.1 C) 98.4 F (36.9 C) 98.4 F (36.9 C)   TempSrc: Oral Oral Oral   SpO2: 99% 98% 96%  Weight:      Height:        Physical Examination: General exam: alert awake, oriented at baseline, older than stated  age HEENT:Oral mucosa moist, Ear/Nose WNL grossly Respiratory system: Bilaterally clear BS,no use of accessory muscle Cardiovascular system: S1 & S2 +, No JVD. Gastrointestinal system: Abdomen soft,NT,ND, BS+ Nervous System: Alert, awake, moving all extremities,and following commands. Extremities: LE edema neg,distal peripheral pulses palpable and warm.  Skin: No rashes,no icterus. MSK: Normal muscle bulk,tone, power   Data Reviewed: I have personally reviewed following labs and imaging studies ( see epic result tab) CBC: Recent Labs  Lab 05/28/24 0346 05/29/24 0416  WBC 15.2* 18.6*  NEUTROABS 12.6*  --   HGB 10.8* 10.9*  HCT 31.7* 31.6*  MCV 86.8 85.2  PLT 277 239   CMP: Recent Labs  Lab 05/28/24 0346 05/29/24 0416 05/30/24 0906 05/31/24 0424  NA 133* 133* 133* 133*  K 3.3* 3.5 2.8* 3.5  CL 104 104 99 103  CO2 21* 21* 22 21*  GLUCOSE 119* 122* 181* 129*  BUN 20 22 21 20   CREATININE 1.15* 0.87 1.13* 1.13*  CALCIUM  8.5* 8.6* 7.7* 7.4*  MG 1.8 1.7  --  1.6*  PHOS  --  1.7* 2.8 3.1   GFR: Estimated Creatinine Clearance: 46.4 mL/min (A) (by C-G formula based on SCr of 1.13 mg/dL (H)). Recent Labs  Lab 05/28/24 0346  AST 15  ALT 20  ALKPHOS 128*  BILITOT 0.6  PROT 6.4*  ALBUMIN 2.8*   No results for input(s): "LIPASE", "AMYLASE" in the last 168 hours. No results for input(s): "AMMONIA" in the last 168 hours. Coagulation Profile:  No results for input(s): "INR", "PROTIME" in the last 168 hours.  Unresulted Labs (From admission, onward)    None      Antimicrobials/Microbiology: Anti-infectives (From admission, onward)    None      No results found for: "SDES", "SPECREQUEST", "CULT", "REPTSTATUS"  Procedures: Procedure(s) (LRB): BRONCHOSCOPY, WITH EBUS (Bilateral) VIDEO BRONCHOSCOPY WITH ENDOBRONCHIAL NAVIGATION (Right) VIDEO BRONCHOSCOPY WITH RADIAL ENDOBRONCHIAL ULTRASOUND BRONCHOSCOPY, WITH NEEDLE ASPIRATION BIOPSY BRONCHOSCOPY, WITH BRUSH  BIOPSY BRONCHOSCOPY, WITH BIOPSY IRRIGATION, BRONCHUS Medications reviewed:  Scheduled Meds:  amLODipine   5 mg Oral Daily   dexamethasone   2 mg Oral BID   letrozole   2.5 mg Oral Daily   metoprolol  succinate  150 mg Oral Daily   oxyCODONE   10 mg Oral Q12H   senna  2 tablet Oral QHS   Continuous Infusions:    Lesa Rape, MD [facility] 06/03/2024, 10:20 AM

## 2024-06-04 ENCOUNTER — Ambulatory Visit

## 2024-06-04 DIAGNOSIS — M8448XA Pathological fracture, other site, initial encounter for fracture: Secondary | ICD-10-CM | POA: Diagnosis not present

## 2024-06-04 DIAGNOSIS — R4589 Other symptoms and signs involving emotional state: Secondary | ICD-10-CM | POA: Diagnosis not present

## 2024-06-04 DIAGNOSIS — Z515 Encounter for palliative care: Secondary | ICD-10-CM | POA: Diagnosis not present

## 2024-06-04 DIAGNOSIS — M84454A Pathological fracture, pelvis, initial encounter for fracture: Secondary | ICD-10-CM | POA: Diagnosis not present

## 2024-06-04 NOTE — TOC Progression Note (Addendum)
 Transition of Care [facility]) - Progression Note    Patient Details  Name: Angela Lin MRN: [id number] Date of Birth: 1961-10-31  [facility] CM/SW Contact  Jonni Nettle, LCSW Phone Number: 06/04/2024, 9:22 AM  Clinical Narrative:    CSW notified attending provider, Dr. Bobbetta Burnet, of [employer]'s request for peer-to-peer evaluation for SNF insurance authorization. Per Dr. Bobbetta Burnet, Dr. Toby Fortune with [facility], will call provider tomorrow for peer-to-peer between 9am-2pm. TOC will continue to follow.   Expected Discharge Plan:  (TBD) Barriers to Discharge: Continued Medical Work up, English as a second language teacher  Expected Discharge Plan and Services  Short-term rehab at [facility] arrangements for the past 2 months: Single Family Home    Social Determinants of Health (SDOH) Interventions SDOH Screenings   Food Insecurity: No Food Insecurity (05/22/2024)  Housing: Low Risk  (05/22/2024)  Transportation Needs: No Transportation Needs (05/22/2024)  Utilities: Not At Risk (05/22/2024)  Tobacco Use: High Risk (05/24/2024)    Readmission Risk Interventions    05/26/2024    4:01 PM  Readmission Risk Prevention Plan  Post Dischage Appt Complete  Medication Screening Complete  Transportation Screening Complete    [place], MSW, LCSW 06/04/2024 9:22 AM

## 2024-06-04 NOTE — Plan of Care (Signed)
  Problem: Education: Goal: Knowledge of General Education information will improve Description: Including pain rating scale, medication(s)/side effects and non-pharmacologic comfort measures Outcome: Progressing   Problem: Health Behavior/Discharge Planning: Goal: Ability to manage health-related needs will improve Outcome: Progressing   Problem: Clinical Measurements: Goal: Ability to maintain clinical measurements within normal limits will improve Outcome: Progressing Goal: Will remain free from infection Outcome: Progressing Goal: Diagnostic test results will improve Outcome: Progressing Goal: Respiratory complications will improve Outcome: Progressing Goal: Cardiovascular complication will be avoided Outcome: Progressing   Problem: Coping: Goal: Level of anxiety will decrease Outcome: Progressing   Problem: Nutrition: Goal: Adequate nutrition will be maintained Outcome: Progressing   Problem: Elimination: Goal: Will not experience complications related to bowel motility Outcome: Progressing Goal: Will not experience complications related to urinary retention Outcome: Progressing   Problem: Pain Managment: Goal: General experience of comfort will improve and/or be controlled Outcome: Progressing   Problem: Safety: Goal: Ability to remain free from injury will improve Outcome: Progressing   Problem: Skin Integrity: Goal: Risk for impaired skin integrity will decrease Outcome: Progressing

## 2024-06-04 NOTE — Progress Notes (Signed)
 PROGRESS NOTE Angela Lin  FAO:[id number] DOB: 11-15-1961 DOA: 05/22/2024 PCP: Helyn Lobstein, MD  Brief Narrative/Hospital Course: 63 y.o. 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  Patient follows with Dr. Gudena. Patient had outpatient labs performed revealing severe hypercalcemia patient was instructed to come to the [facility] Emergency Department for evaluation. in ED:CT imaging of the chest abdomen and pelvis was performed revealing a right upper lobe mass measuring 5.8 x 5.7 cm in addition to diffuse bony metastatic disease throughout the abdomen and pelvis with a destructive soft tissue mass in the right L1 pedicle causing significant spinal canal and neuroforaminal narrowing.  Case was discussed with Dr. Larrie Po of neurosurgery who recommended oncology and radiation oncology consultations for potential management.  Patient was initiated on intravenous fluids for the severe hypercalcemia and the hospitalist group was then called to assess the patient for admission to the hospital. Patient treated with IVF, bisphosphonate, calcitonin.   She's now s/p bronchoscopy.  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   She's not interested in treatment per discussion with her oncology (Dr. Lee Public) 6/2. At this time patient is waiting for skilled nursing facility>. P2P attempted on 6/6, 6/7-but [employer] office  was closed   Subjective: Patient seen and examined Sleeping comfortably No complaint Overnight afebrile BP stable vertebral placement   Assessment and plan:  Metastatic breast cancer with lung and bone mets Hypercalcemia of Malignancy Goals of care:: She has Right Upper Lobe Mass, Mediastinal Adenopathy Multifocal  Osseous Metastatic Disease Hypodensities in the liver concerning for metastatic disease. S/p calcitonin and zometa  - ca better. 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 Radiation onc planning for radiation to T7, L1, and right rib metastases (first treatment 6/2) PTOT consult done pending SNF bed offers. P2P review attempted 6/6 and 6/7 and [facility] been closed for the weekend> called [employer] and they have been given timeline tomorrow and they will contact us . Phone no  provided  Hx Breast Cancer with Lung and Bone Mets Follows with Dr. Lee Public. Workup for malignancy as noted above   AKI Hypokalemia Resolved   Hypomagnesemia : Continue Mag-Ox.     Pathologic Pubic Rami Fractures: WBAT, cont Non op management per orth (5/28 note from Dorthy Gavia)   Hypertension: BP stable, continue Amlodipine , metoprolol     Leukocytosis Related to steroids     DVT prophylaxis: Place and maintain sequential compression device Start: 05/28/24 1558.  She has a Lovenox /chemical DVT prophylaxis-wants to continue ambulate discussed that she is high risk for DVT. Code Status:   Code Status: Limited: Do not attempt resuscitation (DNR) -DNR-LIMITED -Do Not Intubate/DNI  Family Communication: plan of care discussed with patient at bedside. Patient status is: Remains hospitalized because of severity of illness Level of care: Med-Surg   Dispo: The patient is from: home            Anticipated disposition: Awaiting SNF-waiting for callback from the insurance company for peer to peer review on 6/10  Objective: Vitals last 24 hrs: Vitals:   06/03/24 0931 06/03/24 1225 06/03/24 1949 06/04/24 0419  BP: 124/64 125/65 119/79 128/70  Pulse: 66 63 61 61  Resp:  18  18 18   Temp:  98.2 F (36.8 C) 98.4 F (36.9 C) 98.4 F (36.9 C)  TempSrc:  Oral  Oral  SpO2:  97% 98% 97%  Weight:      Height:        Physical Examination: General exam: alert awake, oriented HEENT:Oral mucosa moist, Ear/Nose WNL grossly Respiratory system: Bilaterally clear BS,no use of accessory muscle Cardiovascular system: S1 & S2 +, No JVD. Gastrointestinal system: Abdomen soft,NT,ND, BS+ Nervous System: Alert, awake, moving all extremities,and following commands. Extremities: LE edema neg,distal peripheral pulses palpable and warm.  Skin: No rashes,no icterus. MSK: Normal muscle bulk,tone, power    Data Reviewed: I have personally reviewed following labs and imaging studies ( see epic result tab) CBC: Recent Labs  Lab 05/29/24 0416  WBC 18.6*  HGB 10.9*  HCT 31.6*  MCV 85.2  PLT 239   CMP: Recent Labs  Lab 05/29/24 0416 05/30/24 0906 05/31/24 0424  NA 133* 133* 133*  K 3.5 2.8* 3.5  CL 104 99 103  CO2 21* 22 21*  GLUCOSE 122* 181* 129*  BUN 22 21 20   CREATININE 0.87 1.13* 1.13*  CALCIUM  8.6* 7.7* 7.4*  MG 1.7  --  1.6*  PHOS 1.7* 2.8 3.1   GFR: Estimated Creatinine Clearance: 46.4 mL/min (A) (by C-G formula based on SCr of 1.13 mg/dL (H)). No results for input(s): "AST", "ALT", "ALKPHOS", "BILITOT", "PROT", "ALBUMIN" in the last 168 hours.  No results for input(s): "LIPASE", "AMYLASE" in the last 168 hours. No results for input(s): "AMMONIA" in the last 168 hours. Coagulation Profile:  No results for input(s): "INR", "PROTIME" in the last 168 hours.  Unresulted Labs (From admission, onward)    None      Antimicrobials/Microbiology: Anti-infectives (From admission, onward)    None      No results found for: "SDES", "SPECREQUEST", "CULT", "REPTSTATUS"  Procedures: Procedure(s) (LRB): BRONCHOSCOPY, WITH EBUS (Bilateral) VIDEO BRONCHOSCOPY WITH ENDOBRONCHIAL NAVIGATION (Right) VIDEO BRONCHOSCOPY WITH RADIAL ENDOBRONCHIAL  ULTRASOUND BRONCHOSCOPY, WITH NEEDLE ASPIRATION BIOPSY BRONCHOSCOPY, WITH BRUSH BIOPSY BRONCHOSCOPY, WITH BIOPSY IRRIGATION, BRONCHUS Medications reviewed:  Scheduled Meds:  amLODipine   5 mg Oral Daily   dexamethasone   2 mg Oral BID   letrozole   2.5 mg Oral Daily   metoprolol  succinate  150 mg Oral Daily   oxyCODONE   10 mg Oral Q12H   senna  2 tablet Oral QHS   Continuous Infusions:    Lesa Rape, MD [facility] 06/04/2024, 12:06 PM

## 2024-06-04 NOTE — Progress Notes (Signed)
 OT Cancellation Note  Patient Details Name: Angela Lin MRN: [id number] DOB: 18-Sep-1961   Cancelled Treatment:    Reason Eval/Treat Not Completed: Other (comment) Patient reported that she does not need occupational therapy services any more. Patient was educated on goals for therapy at this time with patient reporting she had those ADL tasks under control. OT to sign off per patient request.  Wynette Heckler, MS Acute Rehabilitation Department Office# [phone]  06/04/2024, 2:06 PM

## 2024-06-04 NOTE — Progress Notes (Signed)
 PT Cancellation Note  Patient Details Name: Angela Lin MRN: [id number] DOB: 1961-08-27   Cancelled Treatment:     Pt in bed visiting with her Mother.  Pt declined at this time.  Per nursing staff, Pt is amb self to the bathroom.    Bess Broody  PTA Acute  Rehabilitation Services Office M-F          [phone]

## 2024-06-04 NOTE — Progress Notes (Signed)
 Daily Progress Note   Patient Name: Angela Lin       Date: 06/04/2024 DOB: 08-Sep-1961  Age: 63 y.o. MRN#: [id number] Attending Physician: Lesa Rape, MD Primary Care Physician: Helyn Lobstein, MD Admit Date: 05/22/2024 Length of Stay: 13 days  Reason for Consultation/Follow-up: Establishing goals of care  Subjective:   CC: Patient lying in bed.  Following up regarding complex medical decision making.  Subjective:  Reviewed EMR prior to presenting to bedside.  Time of EMR review on past 24 hours, patient has received as needed oxycodone  5 mg x 2 doses.  Patient continues to receive OxyContin  10 mg every 12 hours during the day. Disposition still pending as hospitalist has been trying to coordinate with insurance company regarding peer to peer review.  Presented to bedside to see patient.  Patient laying comfortably in bed.  Patient noting pain well-managed at this time.  Patient awaiting further information about peer to peer review.  Discussed planning moving forward.  Patient is hopeful to go to rehab to regain strength.  If patient unable to go to rehab, would recommend patient returning to her mother's house with hospice.  Patient acknowledged this.  Spent time providing emotional support via active listening.  All questions answered at that time.  Thanked patient for allowing me to visit with her today.  Review of Systems Patient pain well controlled Objective:   Vital Signs:  BP 128/70 (BP Location: Right Arm)   Pulse 61   Temp 98.4 F (36.9 C)   Resp 18   Ht 5\' 5"  (1.651 m)   Wt 68 kg   LMP 08/27/2008 (Approximate)   SpO2 97%   BMI 24.95 kg/m   Physical Exam: General: NAD, alert, pleasant Cardiovascular: RRR Respiratory: no increased work of breathing noted, not in respiratory distress Neuro: A&Ox4, following commands easily Psych: appropriately answers all questions  Imaging: I personally reviewed recent imaging.   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  During hospitalization patient was found to have a right upper lobe mass with diffuse bony metastatic disease throughout the abdomen and pelvis with a destructive soft tissue mesh in the right L1 pedicle causing significant spinal cord and neural foraminal narrowing.  Patient diagnosed with metastatic breast cancer with lung and bone metastases.  Oncology and radiation oncology consulted for recommendations.  Palliative medicine team consulted to assist with complex medical decision making.  Recommendations/Plan: # Complex medical decision making/goals of care:  - Discussed care with patient as detailed above in HPI.  Patient is not planning to seek further cancer therapies.  Patient noted she is "at peace" with her death when the time comes.  Awaiting outcome of peer to peer conversation to determine if patient can go to rehab.  If patient is not accepted to rehab, recommend going to her mother's home with hospice support.  TOC to assist with discharge coordination.  Palliative medicine team will continue to follow along and engage in conversations as able and appropriate.  -  Code Status: Limited: Do not attempt resuscitation (DNR) -DNR-LIMITED -Do Not Intubate/DNI   # Symptom management: Patient is receiving these palliative interventions for symptom management with an intent to improve quality of life.   -  Pain, in setting of metastatic cancer   - Continue OxyContin  10 mg every 12 hours during the day.  First dose at 2200 on 06/02/2024.   - Continue as needed oxycodone  5 mg every 4 hours  - Discontinued Lovenox  as per  patient's request that it causes her discomfort.  Reviewed risk and benefits of this medication and patient accepting of risks associated with not receiving Lovenox  injections.  # Psychosocial Support:  - Mother, brother  # Discharge Planning: To Be Determined  Thank you for allowing the palliative care team to participate in the care [facility].  Barnett Libel, DO Palliative Care Provider PMT # [phone]  If patient remains symptomatic despite maximum doses, please call PMT at [phone] between 0700 and 1900. Outside of these hours, please call attending, as PMT does not have night coverage.

## 2024-06-05 ENCOUNTER — Ambulatory Visit

## 2024-06-05 NOTE — Plan of Care (Signed)

## 2024-06-05 NOTE — Progress Notes (Signed)
  Daily Progress Note   Patient Name: Angela Lin       Date: 06/05/2024 DOB: 11/19/1961  Age: 63 y.o. MRN#: [id number] Attending Physician: Lesa Rape, MD Primary Care Physician: Helyn Lobstein, MD Admit Date: 05/22/2024 Length of Stay: 14 days  Reason for Consultation/Follow-up: Establishing goals of care  Subjective:   CC: Patient lying in bed.  Following up regarding complex medical decision making.  Subjective:  Reviewed EMR prior to presenting to bedside.  Medication history noted.   Patient resting in bed, in no distress.  TRH and TOC notes reviewed, awaiting peer-to-peer discussions regarding an appropriate disposition.   Review of Systems Patient pain well controlled Objective:   Vital Signs:  BP 130/67 (BP Location: Left Arm)   Pulse 65   Temp 98.4 F (36.9 C) (Tympanic)   Resp 18   Ht 5\' 5"  (1.651 m)   Wt 68 kg   LMP 08/27/2008 (Approximate)   SpO2 98%   BMI 24.95 kg/m   Physical Exam: General: NAD  Cardiovascular: RRR Respiratory: no increased work of breathing noted, not in respiratory distress Neuro: resting comfortably, no distress noted.   Imaging: I personally reviewed recent imaging.   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  During hospitalization patient was found to have a right upper lobe mass with diffuse bony metastatic disease throughout the abdomen and pelvis with a destructive soft tissue mesh in the right L1 pedicle causing significant spinal cord and neural foraminal narrowing.  Patient diagnosed with metastatic breast cancer with lung and bone metastases.  Oncology and radiation  oncology consulted for recommendations.  Palliative medicine team consulted to assist with complex medical decision making.  Recommendations/Plan: # Complex medical decision making/goals of care:  - Discussed care with patient as detailed above in HPI.  Patient is not planning to seek further cancer therapies.  Patient noted she is "at peace" with her death when the time comes.  Awaiting outcome of peer to peer conversation to determine if patient can go to rehab.  If patient is not accepted to rehab, recommend going to her mother's home with hospice support.  TOC is assisting with discharge coordination.  Palliative medicine team will continue to follow along and engage in conversations as able and appropriate.  -  Code Status: Limited: Do not attempt resuscitation (DNR) -DNR-LIMITED -Do Not Intubate/DNI   # Symptom management: Patient is receiving these palliative interventions for symptom management with an intent to improve quality of life.   - Pain, in setting of metastatic cancer   - Continue OxyContin  10 mg every 12 hours during the day.  First dose at 2200 on 06/02/2024.   - Continue as needed oxycodone  5 mg every 4 hours   # Psychosocial Support:  - Mother, brother  # Discharge Planning: To Be Determined  Thank you for allowing the palliative care team to participate in the care [facility].  Low MDM Lujean Sake MD.  Palliative Care Provider PMT # [phone]  If patient remains symptomatic despite maximum doses, please call PMT at [phone] between 0700 and 1900. Outside of these hours, please call attending, as PMT does not have night coverage.

## 2024-06-05 NOTE — Progress Notes (Signed)
 PROGRESS NOTE Angela Lin  WUJ:[id number] DOB: 07/19/1961 DOA: 05/22/2024 PCP: Helyn Lobstein, MD  Brief Narrative/Hospital Course: 63 y.o. 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  Patient follows with Dr. Gudena. Patient had outpatient labs performed revealing severe hypercalcemia patient was instructed to come to the [facility] Emergency Department for evaluation. in ED:CT imaging of the chest abdomen and pelvis was performed revealing a right upper lobe mass measuring 5.8 x 5.7 cm in addition to diffuse bony metastatic disease throughout the abdomen and pelvis with a destructive soft tissue mass in the right L1 pedicle causing significant spinal canal and neuroforaminal narrowing.  Case was discussed with Dr. Larrie Po of neurosurgery who recommended oncology and radiation oncology consultations for potential management.  Patient was initiated on intravenous fluids for the severe hypercalcemia and the hospitalist group was then called to assess the patient for admission to the hospital. Patient treated with IVF, bisphosphonate, calcitonin.   She's now s/p bronchoscopy.  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   She's not interested in treatment per discussion with her oncology (Dr. Lee Public) 6/2. At this time patient is waiting for skilled nursing facility>. P2P attempted on 6/6, 6/7-but [employer] office  was closed   Subjective: Patient seen and examined Overnight afebrile BP stable  Awaiting for placement, peer to peer review pending 6/10-waiting for call back from insurance, Last BM 6/6-passing gas does not feel bloated She wants us  to communicate with his brother [name] for any plan: I informed her that we are waiting  for peer-to-peer, at this time not much updates-she verbalized understanding, social cooperative following  Assessment and plan:  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 S/p calcitonin and zometa  - ca better. 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 Radiation onc planning for radiation to T7, L1, and right rib metastases (first treatment 6/2) PTOT consult done pending SNF bed offers. P2P review attempted 6/6 and 6/7 and [facility] for the weekend> called the insurance company 6/9  and they have informed they will call back on 6/10, my phone number was  provided> I will ask TOC to follow-up.  Hx Breast Cancer with Lung and Bone Mets Follows with Dr. Lee Public. Workup for malignancy as noted above   AKI Hypokalemia Resolved   Hypomagnesemia : Continue Mag-Ox.     Pathologic Pubic Rami Fractures: WBAT, cont Non op management per orth (5/28 note from Dorthy Gavia)   Hypertension: BP stable, continue Amlodipine , metoprolol     Leukocytosis Related to steroids    Constipation: Continue stool softeners bowel regimen   DVT prophylaxis: Place and maintain sequential compression device Start: 05/28/24 1558.  Plan Lovenox  but agreed for Xarelto once a day  Code Status:   Code Status: Limited: Do not attempt resuscitation (DNR) -DNR-LIMITED -Do Not Intubate/DNI  Family Communication: plan of care discussed with patient at bedside. Patient status is: Remains hospitalized because of severity of illness Level of care: Med-Surg   Dispo: The  patient is from: home            Anticipated disposition: Awaiting SNF-waiting for callback from the insurance company for peer to peer review on 6/10  Objective: Vitals last 24 hrs: Vitals:   06/04/24 1233 06/04/24 2005 06/05/24 0414 06/05/24 1131  BP: 120/69 (!) 117/59 (!) 134/53 130/67  Pulse: 66 69 65 65  Resp: 18  16 18   Temp: 98.4 F (36.9 C) 98.4 F (36.9 C) 97.9 F (36.6 C) 98.4 F (36.9 C)  TempSrc:  Oral Oral Tympanic  SpO2: 98% 97% 96% 98%  Weight:      Height:        Physical Examination: General exam: alert awake, oriented at baseline, older than stated age HEENT:Oral mucosa moist, Ear/Nose WNL grossly Respiratory system: Bilaterally clear BS,no use of accessory muscle Cardiovascular system: S1 & S2 +, No JVD. Gastrointestinal system: Abdomen soft,NT,ND, BS+ Nervous System: Alert, awake, moving all extremities,and following commands. Extremities: LE edema neg,distal peripheral pulses palpable and warm.  Skin: No rashes,no icterus. MSK: Normal muscle bulk,tone, power  Data Reviewed: I have personally reviewed following labs and imaging studies ( see epic result tab) CBC: No results for input(s): "WBC", "NEUTROABS", "HGB", "HCT", "MCV", "PLT" in the last 168 hours.  CMP: Recent Labs  Lab 05/30/24 0906 05/31/24 0424  NA 133* 133*  K 2.8* 3.5  CL 99 103  CO2 22 21*  GLUCOSE 181* 129*  BUN 21 20  CREATININE 1.13* 1.13*  CALCIUM  7.7* 7.4*  MG  --  1.6*  PHOS 2.8 3.1   GFR: Estimated Creatinine Clearance: 46.4 mL/min (A) (by C-G formula based on SCr of 1.13 mg/dL (H)). No results for input(s): "AST", "ALT", "ALKPHOS", "BILITOT", "PROT", "ALBUMIN" in the last 168 hours.  No results for input(s): "LIPASE", "AMYLASE" in the last 168 hours. No results for input(s): "AMMONIA" in the last 168 hours. Coagulation Profile:  No results for input(s): "INR", "PROTIME" in the last 168 hours.  Unresulted Labs (From admission, onward)    None       Antimicrobials/Microbiology: Anti-infectives (From admission, onward)    None      No results found for: "SDES", "SPECREQUEST", "CULT", "REPTSTATUS"  Procedures: Procedure(s) (LRB): BRONCHOSCOPY, WITH EBUS (Bilateral) VIDEO BRONCHOSCOPY WITH ENDOBRONCHIAL NAVIGATION (Right) VIDEO BRONCHOSCOPY WITH RADIAL ENDOBRONCHIAL ULTRASOUND BRONCHOSCOPY, WITH NEEDLE ASPIRATION BIOPSY BRONCHOSCOPY, WITH BRUSH BIOPSY BRONCHOSCOPY, WITH BIOPSY IRRIGATION, BRONCHUS Medications reviewed:  Scheduled Meds:  amLODipine   5 mg Oral Daily   dexamethasone   2 mg Oral BID   letrozole   2.5 mg Oral Daily   metoprolol  succinate  150 mg Oral Daily   oxyCODONE   10 mg Oral Q12H   senna  2 tablet Oral QHS   Continuous Infusions:    Lesa Rape, MD [facility] 06/05/2024, 1:13 PM

## 2024-06-06 ENCOUNTER — Ambulatory Visit

## 2024-06-06 MED ORDER — RIVAROXABAN 10 MG PO TABS
10.0000 mg | ORAL_TABLET | Freq: Every day | ORAL | Status: DC
  Administered 2024-06-06 – 2024-06-08 (×3): 10 mg via ORAL
  Filled 2024-06-06 (×3): qty 1

## 2024-06-06 NOTE — Progress Notes (Signed)
 PROGRESS NOTE Angela Lin  GNF:[id number] DOB: [birth year] DOA: 05/22/2024 PCP: Helyn Lobstein, MD  Brief Narrative/Hospital Course: 63 y.o. 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  Patient follows with Dr. Gudena. Patient had outpatient labs performed revealing severe hypercalcemia patient was instructed to come to the [facility] Emergency Department for evaluation. In the ED:CT imaging of the chest abdomen and pelvis was performed revealing a right upper lobe mass measuring 5.8 x 5.7 cm in addition to diffuse bony metastatic disease throughout the abdomen and pelvis with a destructive soft tissue mass in the right L1 pedicle causing significant spinal canal and neuroforaminal narrowing.  Case was discussed with Dr. Larrie Po of neurosurgery who recommended oncology and radiation oncology consultations for potential management.  Patient was initiated on intravenous fluids for the severe hypercalcemia and the hospitalist group was then called to assess the patient for admission to the hospital. Patient treated with IVF, bisphosphonate, calcitonin.   She's now s/p bronchoscopy.  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  She's not interested in treatment per discussion with her oncology (Dr. Lee Public) 6/2. At this time patient is waiting for skilled nursing facility>. P2P attempted on 6/6, 6/7-but [employer] office  was closed and completed 6/11.   Subjective: Seen and examined. Peer to peer review completed-Snf declined at this time as not enough info and no clear cut need for skiiled nursing need and therapy needs>  last ptot visit on 6/5-patient has declined other visits and has been ambulating self to the bathroom   Assessment and  plan:  Metastatic breast cancer with lung and bone mets Hypercalcemia of Malignancy Goals of care: She has Right Upper Lobe Mass, Mediastinal Adenopathy Multifocal Osseous Metastatic Disease Hypodensities in the liver concerning for metastatic disease. S/p calcitonin and zometa  - ca better. Cytology from 5/29 bronch showing malignant cells most c/w metastatic breast carcinoma and malignant cells consistent with non small cell carcinoma Oncology discussed with the patient and she does not want to pursue any further cancer treatment > wants to consider rehabilitation.Radiation onc planning for radiation to T7, L1, and right rib metastases (first treatment 6/2). PTOT consult done pending SNF bed offers. P2P review attempted 6/6 and 6/7 and [facility] has been closed for the weekend>called the insurance company 6/9  and they have informed they will call back on 6/10, my phone number was  provided>I will ask TOC to follow-up. Hospice is being set up for return to mother's home w/ hospice.   Hx Breast Cancer with Lung and Bone Mets Follows with Dr. Lee Public. Workup for malignancy as noted above   AKI Hypokalemia Resolved   Hypomagnesemia : Continue Mag-Ox.     Pathologic Pubic Rami Fractures: WBAT, cont Non op management per orth (5/28 note from Dorthy Gavia)   Hypertension: BP stable, continue Amlodipine , metoprolol     Leukocytosis Related to steroids    Constipation: Continue stool softeners bowel regimen.   DVT prophylaxis: rivaroxaban (XARELTO) tablet 10 mg Start: 06/06/24 2200 Place and maintain sequential compression device Start: 05/28/24 1558.  Plan Lovenox  but agreed for Xarelto once a day  Code Status:   Code Status: Limited: Do not attempt resuscitation (DNR) -DNR-LIMITED -Do Not Intubate/DNI  Family Communication: plan of care discussed with patient at bedside. Called and updated [name] her brother Patient status is: Remains hospitalized because of  severity of illness Level of  care: Med-Surg   Dispo: The patient is from: home            Anticipated disposition: Plan for home w/ hospice once arranged. Per [employer] peer to peer declined snf  Objective: Vitals last 24 hrs: Vitals:   06/05/24 0414 06/05/24 1131 06/05/24 2113 06/06/24 0602  BP: (!) 134/53 130/67 (!) 140/53 131/65  Pulse: 65 65 73 63  Resp: 16 18 16 15   Temp: 97.9 F (36.6 C) 98.4 F (36.9 C) 98.2 F (36.8 C) 98.1 F (36.7 C)  TempSrc: Oral Tympanic Oral Oral  SpO2: 96% 98% 96% 98%  Weight:      Height:        Physical Examination: General exam: alert awake, oriented  HEENT:Oral mucosa moist, Ear/Nose WNL grossly Respiratory system: Bilaterally clear BS,no use of accessory muscle Cardiovascular system: S1 & S2 +, No JVD. Gastrointestinal system: Abdomen soft,NT,ND, BS+ Nervous System: Alert, awake, moving all extremities,and following commands. Extremities: LE edema neg,distal peripheral pulses palpable and warm.  Skin: No rashes,no icterus. MSK: Normal muscle bulk,tone, power   Data Reviewed: I have personally reviewed following labs and imaging studies ( see epic result tab) CBC: No results for input(s): WBC, NEUTROABS, HGB, HCT, MCV, PLT in the last 168 hours.  CMP: Recent Labs  Lab 05/31/24 0424  NA 133*  K 3.5  CL 103  CO2 21*  GLUCOSE 129*  BUN 20  CREATININE 1.13*  CALCIUM  7.4*  MG 1.6*  PHOS 3.1   GFR: Estimated Creatinine Clearance: 46.4 mL/min (A) (by C-G formula based on SCr of 1.13 mg/dL (H)). No results for input(s): AST, ALT, ALKPHOS, BILITOT, PROT, ALBUMIN in the last 168 hours.  No results for input(s): LIPASE, AMYLASE in the last 168 hours. No results for input(s): AMMONIA in the last 168 hours. Coagulation Profile:  No results for input(s): INR, PROTIME in the last 168 hours.  Unresulted Labs (From admission, onward)    None      Antimicrobials/Microbiology: Anti-infectives (From admission, onward)    None       No results found for: SDES, SPECREQUEST, CULT, REPTSTATUS  Procedures: Procedure(s) (LRB): BRONCHOSCOPY, WITH EBUS (Bilateral) VIDEO BRONCHOSCOPY WITH ENDOBRONCHIAL NAVIGATION (Right) VIDEO BRONCHOSCOPY WITH RADIAL ENDOBRONCHIAL ULTRASOUND BRONCHOSCOPY, WITH NEEDLE ASPIRATION BIOPSY BRONCHOSCOPY, WITH BRUSH BIOPSY BRONCHOSCOPY, WITH BIOPSY IRRIGATION, BRONCHUS Medications reviewed:  Scheduled Meds:  amLODipine   5 mg Oral Daily   dexamethasone   2 mg Oral BID   letrozole   2.5 mg Oral Daily   metoprolol  succinate  150 mg Oral Daily   oxyCODONE   10 mg Oral Q12H   rivaroxaban  10 mg Oral QHS   senna  2 tablet Oral QHS   Continuous Infusions:    Lesa Rape, MD [facility] 06/06/2024, 11:56 AM

## 2024-06-06 NOTE — Progress Notes (Signed)
 Mobility Specialist - Progress Note   06/06/24 1050  Mobility  Activity Ambulated with assistance in hallway  Level of Assistance Modified independent, requires aide device or extra time  Assistive Device Front wheel walker  Distance Ambulated (ft) 200 ft  Activity Response Tolerated well  Mobility Referral Yes  Mobility visit 1 Mobility  Mobility Specialist Start Time (ACUTE ONLY) 1043  Mobility Specialist Stop Time (ACUTE ONLY) 1050  Mobility Specialist Time Calculation (min) (ACUTE ONLY) 7 min   Pt received in bed and agreeable to mobility. No complaints during session. Pt to bed after session with all needs met.   [facility]

## 2024-06-06 NOTE — Progress Notes (Signed)
 Daily Progress Note   Patient Name: Angela Lin       Date: 06/06/2024 DOB: April 15, 1961  Age: 63 y.o. MRN#: [id number] Attending Physician: Lesa Rape, MD Primary Care Physician: Helyn Lobstein, MD Admit Date: 05/22/2024 Length of Stay: 15 days  Reason for Consultation/Follow-up: Establishing goals of care  Subjective:   CC: Patient lying in bed.  Following up regarding complex medical decision making.  Subjective:  Reviewed EMR prior to presenting to bedside.  Medication history noted.   Patient resting in bed, in no distress.  Patient is awake and alert.  She states that she is trying to be in good spirits as much as possible.  She discusses about the peer-to-peer evaluation and about her being declined a SNF rehab attempt.  She talks about the challenges pertaining to home with hospice discharge as her elderly mother is going to become her primary caregiver.  We spent some time reflecting on the serious nature of her condition and the patient was provided with supportive empathic presence and active listening.  TRH and TOC notes reviewed.  Review of Systems Patient pain well controlled Objective:   Vital Signs:  BP 131/65 (BP Location: Left Arm)   Pulse 63   Temp 98.1 F (36.7 C) (Oral)   Resp 15   Ht 5' 5 (1.651 m)   Wt 68 kg   LMP 08/27/2008 (Approximate)   SpO2 98%   BMI 24.95 kg/m   Physical Exam: General: NAD  Cardiovascular: RRR Respiratory: no increased work of breathing noted, not in respiratory distress Neuro: resting comfortably, no distress noted.   Imaging: I personally reviewed recent imaging.   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  During hospitalization patient was found to have a right upper lobe mass with diffuse bony metastatic disease throughout the abdomen and pelvis with a destructive soft tissue mesh in the right L1 pedicle causing significant spinal cord and neural foraminal narrowing.  Patient diagnosed with metastatic breast cancer with lung and bone metastases.  Oncology and radiation oncology consulted for recommendations.  Palliative medicine team consulted to assist with complex medical decision making.  Recommendations/Plan: # Complex medical decision making/goals of care:  - Discussed care with patient as detailed above in HPI.  Patient is not planning to seek further cancer therapies.  Patient noted she is at peace with her death when the time comes.  She will likely be going to her mother's home with hospice support.  TOC is assisting with discharge coordination.  Palliative medicine team will continue to follow along and engage in conversations as able and appropriate.  -  Code Status: Limited: Do not attempt resuscitation (DNR) -DNR-LIMITED -Do Not Intubate/DNI   # Symptom management: Patient is receiving these palliative interventions for symptom management with an intent to improve quality of life.   - Pain, in setting of metastatic cancer   - Continue OxyContin  10 mg every 12 hours during the day.  First dose at 2200 on 06/02/2024.   - Continue as needed oxycodone  5 mg every 4 hours   # Psychosocial Support:  - Mother, brother  # Discharge Planning: Likely for home with hospice  Thank you for allowing the palliative care  team to participate in the care [facility].  MOD MDM Lujean Sake MD.  Palliative Care Provider PMT # [phone]  If patient remains symptomatic despite maximum doses, please call PMT at [phone] between 0700 and 1900. Outside of these hours, please call attending, as PMT does not have night coverage.

## 2024-06-06 NOTE — Plan of Care (Signed)

## 2024-06-06 NOTE — TOC Progression Note (Addendum)
 Transition of Care [facility]) - Progression Note    Patient Details  Name: Angela Lin MRN: [id number] Date of Birth: 18-Sep-1961  Transition [facility]) CM/SW Contact  Kathryn Parish, RN Phone Number: 06/06/2024, 8:39 AM  Clinical Narrative:    CM called [facility] to confirm bed offer. Spoke with Le Primes who will check. CM called patients brother [name] [phone] to discuss plan of care. Address SNF denial, home with palliative/hospice care, offered choice, and decided on [facility]. CM send referral to [facility], Shawn Deann Exon and Ardine Beckwith. Patients brother called to ask about discharge date and timing. [name] states that an attorney will be coming to the hospital tomorrow for patient to sign legal documents.   Expected Discharge Plan:  (TBD) Barriers to Discharge: Continued Medical Work up, English as a second language teacher  Expected Discharge Plan and Services       Living arrangements for the past 2 months: Single Family Home                                       Social Determinants of Health (SDOH) Interventions SDOH Screenings   Food Insecurity: No Food Insecurity (05/22/2024)  Housing: Low Risk  (05/22/2024)  Transportation Needs: No Transportation Needs (05/22/2024)  Utilities: Not At Risk (05/22/2024)  Tobacco Use: High Risk (05/24/2024)    Readmission Risk Interventions    05/26/2024    4:01 PM  Readmission Risk Prevention Plan  Post Dischage Appt Complete  Medication Screening Complete  Transportation Screening Complete

## 2024-06-06 NOTE — TOC Progression Note (Signed)
 Transition of Care [facility]) - Progression Note    Patient Details  Name: Angela Lin MRN: [id number] Date of Birth: 1961-06-12  Transition of Care [facility]) CM/SW Contact  Gini Caputo, Greggory Learn, RN Phone Number:  Clinical Narrative:    Peer to to Peer had to be rescheduled due to [facility] having the wrong telephone number to contact Dr. Bobbetta Burnet. The peer to peer is scheduled for today.   Expected Discharge Plan:  (TBD) Barriers to Discharge: Continued Medical Work up, English as a second language teacher  Expected Discharge Plan and Services       Living arrangements for the past 2 months: Single Family Home                                       Social Determinants of Health (SDOH) Interventions SDOH Screenings   Food Insecurity: No Food Insecurity (05/22/2024)  Housing: Low Risk  (05/22/2024)  Transportation Needs: No Transportation Needs (05/22/2024)  Utilities: Not At Risk (05/22/2024)  Tobacco Use: High Risk (05/24/2024)    Readmission Risk Interventions    05/26/2024    4:01 PM  Readmission Risk Prevention Plan  Post Dischage Appt Complete  Medication Screening Complete  Transportation Screening Complete

## 2024-06-07 ENCOUNTER — Ambulatory Visit

## 2024-06-07 ENCOUNTER — Encounter: Admitting: Rehabilitative and Restorative Service Providers"

## 2024-06-07 NOTE — Plan of Care (Signed)
  Problem: Education: Goal: Knowledge of General Education information will improve Description: Including pain rating scale, medication(s)/side effects and non-pharmacologic comfort measures Outcome: Adequate for Discharge   Problem: Health Behavior/Discharge Planning: Goal: Ability to manage health-related needs will improve Outcome: Adequate for Discharge   Problem: Clinical Measurements: Goal: Ability to maintain clinical measurements within normal limits will improve Outcome: Adequate for Discharge Goal: Will remain free from infection Outcome: Adequate for Discharge Goal: Diagnostic test results will improve Outcome: Adequate for Discharge Goal: Respiratory complications will improve Outcome: Adequate for Discharge Goal: Cardiovascular complication will be avoided Outcome: Adequate for Discharge   Problem: Activity: Goal: Risk for activity intolerance will decrease Outcome: Adequate for Discharge   Problem: Nutrition: Goal: Adequate nutrition will be maintained Outcome: Adequate for Discharge   Problem: Coping: Goal: Level of anxiety will decrease Outcome: Adequate for Discharge   Problem: Elimination: Goal: Will not experience complications related to bowel motility Outcome: Adequate for Discharge Goal: Will not experience complications related to urinary retention Outcome: Adequate for Discharge   Problem: Pain Managment: Goal: General experience of comfort will improve and/or be controlled Outcome: Adequate for Discharge   Problem: Safety: Goal: Ability to remain free from injury will improve Outcome: Adequate for Discharge   Problem: Skin Integrity: Goal: Risk for impaired skin integrity will decrease Outcome: Adequate for Discharge   Problem: Acute Rehab PT Goals(only PT should resolve) Goal: Pt Will Go Supine/Side To Sit Outcome: Adequate for Discharge Goal: Pt Will Go Sit To Supine/Side Outcome: Adequate for Discharge Goal: Patient Will Transfer  Sit To/From Stand Outcome: Adequate for Discharge Goal: Pt Will Ambulate Outcome: Adequate for Discharge

## 2024-06-07 NOTE — Progress Notes (Signed)
 Angela Lin [phone] [facility] Liaison Note Received request from Transitions of Care Manager, for hospice services at home after discharge. Spoke with [name] to initiate education related to hospice philosophy, services, and team approach to care. Patient/family verbalized understanding of information given. Per discussion, the plan is for discharge home by likely Saturday.   DME needs discussed. Patient has the no DME in the home. Patient/family requests the following equipment for delivery: walker, bedside commode, shower chair, hospital bed and overbed table. The address has been updated to her mother's address. [name] is the family contact to arrange time of equipment delivery, which he anticipates being tomorrow to allow time for preparing the space for the equipment.   Please send signed and completed DNR home with patient/family. Please provide prescriptions at discharge as needed to ensure ongoing symptom management.   [facility] information and contact numbers given to [place]. Above information shared with Transitions of Care Manager. Please call with any questions or concerns.   Thank you for the opportunity to participate in this patient's care.   [name] BSN, Charity fundraiser, [facility] [employer] [phone]

## 2024-06-07 NOTE — Progress Notes (Signed)
 PHYSICAL THERAPY  Pt resting/napping.  Will attempt to see another time/day as schedule permits.  Bess Broody  PTA Acute  Rehabilitation Services Office M-F          [phone]

## 2024-06-07 NOTE — TOC Progression Note (Signed)
 Transition of Care [facility]) - Progression Note    Patient Details  Name: Angela Lin MRN: [id number] Date of Birth: Sep 06, 1961  Transition of Care [facility]) CM/SW Contact  Kathryn Parish, RN Phone Number: 06/07/2024, 10:24 AM  Clinical Narrative:    CM received a call from the patients brother [name] to discuss to  plans for today and discharge. Message sent to care team to inform them of a 3:00 PM meeting with [facility] and 3:30 PM meeting with attorney to sign legal documents.   Expected Discharge Plan: Home w Hospice Care Barriers to Discharge: SNF Authorization Denied  Expected Discharge Plan and Services       Living arrangements for the past 2 months: Single Family Home                             [facility] Agency: [facility] [place] Date [facility] Agency Contacted: 06/06/24 Time HH Agency Contacted: 1100 Representative spoke with at [facility] Agency: [name]   Social Determinants of Health (SDOH) Interventions SDOH Screenings   Food Insecurity: No Food Insecurity (05/22/2024)  Housing: Low Risk  (05/22/2024)  Transportation Needs: No Transportation Needs (05/22/2024)  Utilities: Not At Risk (05/22/2024)  Tobacco Use: High Risk (05/24/2024)    Readmission Risk Interventions    05/26/2024    4:01 PM  Readmission Risk Prevention Plan  Post Dischage Appt Complete  Medication Screening Complete  Transportation Screening Complete

## 2024-06-07 NOTE — Progress Notes (Signed)
 PROGRESS NOTE Angela Lin  HYQ:[id number] DOB: [birth year] DOA: 05/22/2024 PCP: Angela Lobstein, MD  Brief Narrative/Hospital Course:  63 y.o. 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  Patient follows with Dr. Gudena. Patient had outpatient labs performed revealing severe hypercalcemia patient was instructed to come to the [facility] Emergency Department for evaluation. In the ED:CT imaging of the chest abdomen and pelvis was performed revealing a right upper lobe mass measuring 5.8 x 5.7 cm in addition to diffuse bony metastatic disease throughout the abdomen and pelvis with a destructive soft tissue mass in the right L1 pedicle causing significant spinal canal and neuroforaminal narrowing.  Case was discussed with Dr. Larrie Lin of neurosurgery who recommended oncology and radiation oncology consultations for potential management.  Patient was initiated on intravenous fluids for the severe hypercalcemia and the hospitalist group was then called to assess the patient for admission to the hospital. Patient treated with IVF, bisphosphonate, calcitonin.   She's now s/p bronchoscopy.  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  She's not interested in treatment per discussion with her oncology (Dr. Lee Lin) 6/2. At this time patient is waiting for skilled nursing facility>. P2P attempted on 6/6, 6/7-but [employer] office  was closed and completed 6/11.    Subjective: Seen and examined. 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 S/p calcitonin and zometa  - ca better. Cytology from 5/29 bronch showing malignant cells most c/w metastatic breast carcinoma and malignant cells consistent with non small cell carcinoma Oncology discussed with the patient and she does not want to pursue any further cancer treatment > wants to consider rehabilitation.Radiation onc planning for radiation to T7, L1, and right rib metastases (first treatment 6/2).  Plan now is home with home hospice. DME to be delivered on 6/13, if that happens by a reasonable time should be good to discharge later that day  Hx Breast Cancer with Lung and Bone Mets Follows with Dr. Lee Lin. Workup for malignancy as noted above   AKI Hypokalemia Resolved    Hypomagnesemia : Continue Mag-Ox.     Pathologic Pubic Rami Fractures: WBAT, cont Non op management per orth (5/28 note from Angela Lin)   Hypertension: BP stable, continue Amlodipine , metoprolol     Leukocytosis Related to steroids     Constipation: Continue stool softeners bowel regimen.    DVT prophylaxis: rivaroxaban (XARELTO) tablet 10 mg Start: 06/06/24 2200 Place and maintain sequential compression device Start: 05/28/24 1558.  Plan Lovenox  but agreed for Xarelto once a day  Code Status:   Code Status: Limited: Do not attempt resuscitation (DNR) -DNR-LIMITED -Do Not Intubate/DNI  Family Communication: plan of care discussed with patient at bedside.  Patient status is: Remains hospitalized because pending DME delivery tomorrow Level of care: Med-Surg    Dispo: The patient is from: home            Anticipated disposition:  Plan for home w/ hospice once arranged. Per [employer] peer to peer declined snf Objective: Vitals last 24  hrs: Vitals:   06/06/24 1721 06/06/24 1934 06/07/24 0532 06/07/24 1208  BP: 125/75 121/60 124/67 (!) 113/58  Pulse: 63 67 72 72  Resp: 18 18 18 16   Temp: 97.9 F (36.6 C) 97.7 F (36.5 C) 98 F (36.7 C) 98.8 F (37.1 C)  TempSrc: Oral Oral Oral Oral  SpO2: 96% 98% 97% 97%  Weight:      Height:        Physical Examination: General exam: alert awake, oriented  HEENT:Oral mucosa moist  Respiratory system: Bilaterally clear BS,no use of accessory muscle Cardiovascular system: S1 & S2  Gastrointestinal system: Abdomen soft,NT,ND, BS+ Nervous System: Alert, awake, moving all extremities,and following commands. Extremities:warm, no edema Skin: No rashes,no icterus. MSK: Normal muscle bulk,tone, power   Data Reviewed: I have personally reviewed following labs and imaging studies ( see epic result tab) CBC: No results for input(s): WBC, NEUTROABS, HGB, HCT, MCV, PLT in the last 168 hours.  CMP: No results for input(s): NA, K, CL, CO2, GLUCOSE, BUN, CREATININE, CALCIUM , MG, PHOS in the last 168 hours.  GFR: Estimated Creatinine Clearance: 46.4 mL/min (A) (by C-G formula based on SCr of 1.13 mg/dL (H)). No results for input(s): AST, ALT, ALKPHOS, BILITOT, PROT, ALBUMIN in the last 168 hours.  No results for input(s): LIPASE, AMYLASE in the last 168 hours. No results for input(s): AMMONIA in the last 168 hours. Coagulation Profile:  No results for input(s): INR, PROTIME in the last 168 hours.  Unresulted Labs (From admission, onward)    None      Antimicrobials/Microbiology: Anti-infectives (From admission, onward)    None      No results found for: SDES, SPECREQUEST, CULT, REPTSTATUS  Procedures: Procedure(s) (LRB): BRONCHOSCOPY, WITH EBUS (Bilateral) VIDEO BRONCHOSCOPY WITH ENDOBRONCHIAL NAVIGATION (Right) VIDEO BRONCHOSCOPY WITH RADIAL ENDOBRONCHIAL ULTRASOUND BRONCHOSCOPY, WITH NEEDLE ASPIRATION  BIOPSY BRONCHOSCOPY, WITH BRUSH BIOPSY BRONCHOSCOPY, WITH BIOPSY IRRIGATION, BRONCHUS Medications reviewed:  Scheduled Meds:  amLODipine   5 mg Oral Daily   dexamethasone   2 mg Oral BID   letrozole   2.5 mg Oral Daily   metoprolol  succinate  150 mg Oral Daily   oxyCODONE   10 mg Oral Q12H   rivaroxaban  10 mg Oral QHS   senna  2 tablet Oral QHS   Continuous Infusions:    Raymonde Calico, MD [facility] 06/07/2024, 5:08 PM

## 2024-06-08 ENCOUNTER — Ambulatory Visit

## 2024-06-08 ENCOUNTER — Ambulatory Visit: Payer: Self-pay | Admitting: Pulmonary Disease

## 2024-06-08 ENCOUNTER — Telehealth: Payer: Self-pay

## 2024-06-08 NOTE — TOC Progression Note (Signed)
 Transition of Care [facility]) - Progression Note    Patient Details  Name: Angela Lin MRN: [id number] Date of Birth: Feb 08, 1961  [facility] CM/SW Contact  Tessie Fila, RN Phone Number: 06/08/2024, 3:26 PM  Clinical Narrative:    Pt will DC home tomorrow with hospice care services through [facility]. Pt will be transported by family member via private vehicle at discharge. Walker, bedside commode, shower chair, hospital bed and overbed table have been ordered and set to be delivered. Pt and pt brother are in agreement with DC plan. TOC following.    Expected Discharge Plan: Home w Hospice Care Barriers to Discharge: SNF Authorization Denied  Expected Discharge Plan and Services       Living arrangements for the past 2 months: Single Family Home                             [facility] Agency: [facility] Date [facility] Agency Contacted: 06/06/24 Time HH Agency Contacted: 1100 Representative spoke with at [facility] Agency: [name]   Social Determinants of Health (SDOH) Interventions SDOH Screenings   Food Insecurity: No Food Insecurity (05/22/2024)  Housing: Low Risk  (05/22/2024)  Transportation Needs: No Transportation Needs (05/22/2024)  Utilities: Not At Risk (05/22/2024)  Tobacco Use: High Risk (05/24/2024)    Readmission Risk Interventions    05/26/2024    4:01 PM  Readmission Risk Prevention Plan  Post Dischage Appt Complete  Medication Screening Complete  Transportation Screening Complete

## 2024-06-08 NOTE — Telephone Encounter (Signed)
 Notified the Pt brother, who is her point of contact, regarding her FMLA forms being completed,faxed and confirmation received. Angela Lin hard copy was mailed to her brother address as requested. No questions or concerns at this time.

## 2024-06-08 NOTE — Progress Notes (Signed)
 Progress Note   Patient: Angela Lin UJW:[id number] DOB: 02-07-1961 DOA: 05/22/2024     17 DOS: the patient was seen and examined on 06/08/2024   Brief hospital course: 63 y.o. 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  Patient follows with Dr. Gudena. Patient had outpatient labs performed revealing severe hypercalcemia patient was instructed to come to the [facility] Emergency Department for evaluation. In the ED:CT imaging of the chest abdomen and pelvis was performed revealing a right upper lobe mass measuring 5.8 x 5.7 cm in addition to diffuse bony metastatic disease throughout the abdomen and pelvis with a destructive soft tissue mass in the right L1 pedicle causing significant spinal canal and neuroforaminal narrowing.  Case was discussed with Dr. Larrie Po of neurosurgery who recommended oncology and radiation oncology consultations for potential management.  Patient was initiated on intravenous fluids for the severe hypercalcemia and the hospitalist group was then called to assess the patient for admission to the hospital. Patient treated with IVF, bisphosphonate, calcitonin.   She's now s/p bronchoscopy.  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  She's not interested in treatment per discussion with her oncology (Dr. Lee Public) 6/2. At this time patient is waiting for skilled nursing facility>. P2P attempted on 6/6, 6/7-but [employer] office  was closed and completed 6/11.   Subjective: Seen and examined. Peer to peer review completed-Snf declined at this time as not enough info and no clear cut need for skiiled nursing need and therapy needs>  last ptot visit on 6/5-patient has declined other visits and has been ambulating  self to the bathroom   6/13--> patient seen at bedside this morning, she has no acute complaints.  Awaits delivery of DME at home prior to discharge to hospice.  Has no acute complaints.  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 S/p calcitonin and zometa  - ca better. Cytology from 5/29 bronch showing malignant cells most c/w metastatic breast carcinoma and malignant cells consistent with non small cell carcinoma Oncology discussed with the patient and she does not want to pursue any further cancer treatment > wants to consider rehabilitation.Radiation onc planning for radiation to T7, L1, and right rib metastases (first treatment 6/2). PTOT consult done pending SNF bed offers. P2P review attempted 6/6 and 6/7 and [facility] has been closed for the weekend>called the insurance company 6/9  and they have informed they will call back on 6/10, my phone number was  provided>I will ask TOC to follow-up. Hospice is being set up for return to mother's home w/ hospice.   Hx Breast Cancer with Lung and Bone Mets Follows with Dr. Lee Public. Workup for malignancy as noted above   AKI Hypokalemia Resolved   Hypomagnesemia : Continue Mag-Ox.     Pathologic Pubic Rami Fractures: WBAT, cont Non op management per orth (5/28 note from Dorthy Gavia)   Hypertension: BP stable, continue Amlodipine , metoprolol     Leukocytosis Related to steroids    Constipation: Continue stool softeners bowel regimen.  Assessment and Plan: * Hypercalcemia of malignancy Severe hypercalcemia of malignancy, improving Thought to be secondary to either progressive metastatic breast cancer or right upper lobe mass concerning  for primary lung malignancy. Calcitonin was discontinued on 5/29 Zometa  4 mg  given on 5/27 Transitioning off of intravenous volume resuscitation on 5/31 Serial chemistries to monitor calcium  levels closely  Hypophosphatemia Phos 1.7 K-Phos x 3 doses initiated.  Goals of care, counseling/discussion Visit and goals of care discussions with patient and family daily Patient wishes to be DNR/DNI at this time Prognosis guarded/poor Patient is strongly considering hospice services Dr. Alejos Husband with Palliative care consulted to assist with facilitating these conversations, their assistance is appreciated.  Pathologic fracture of lumbar vertebra, initial encounter Patient continuing to have substantial back pain due to lytic lesions of the spine resulting in destructive mass identified in the right pedicle of L1, on the right pedicle of the L4 vertebral body as well as the L2 vertebral body Continue low-dose dexamethasone  to assist with inflammation and discomfort Opiate-based analgesics for pain control Offered IR consultation for possible kyphoplasty, patient has declined Dr. Lorri Rota with radiation oncology consulted who plans on initiating radiation therapy 6/2  Eventual plan for skilled nursing facility placement this week once bed available and authorization obtained.  Pathological fracture, pelvis, initial encounter for fracture CT imaging revealing fracture of the left superior pubic ramus at the pubic acetabular junction as well as a large destructive mass filling the inferior pubic left pubic ramus measuring 3.5 cm Patient evaluated by orthopedic surgery on 5/28, supportive care recommended, their input is appreciated.  Mass of upper lobe of right lung Patient underwent bronchoscopy with EBUS 5/29 with biopsies obtained, awaiting results Results may not be available prior to discharge. seen on CT imaging measuring 5.8 x 5.7 cm  concern for primary lung malignancy PCCM following daily, their input is appreciated.    Hypokalemia Recurrent Replacing with  potassium chloride  Evaluating for concurrent hypomagnesemia  Monitoring potassium levels with serial chemistries.   AKI (acute kidney injury) (HCC) Creatinine seems to have now stabilized. substantial acute kidney injury secondary to volume depletion and hypercalcemia  Patient has been transitioned off of intravenous fluids. Strict input and output monitoring Minimizing use of nephrotoxic agents  Essential hypertension Continue amlodipine , metoprolol  Blood pressures well-controlled As needed intravenous hydralazine  for markedly elevated blood pressures.  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 Lee Public Patient has been on letrozole  since April 2023, continuing Significant metastatic disease of the spine and pelvis, oncology to coordinate with radiation oncology for potential radiation therapy          Physical Exam: Vitals:   06/07/24 1208 06/07/24 1939 06/08/24 0542 06/08/24 1225  BP: (!) 113/58 134/71 135/69 114/72  Pulse: 72 66 65 76  Resp: 16 17 17 17   Temp: 98.8 F (37.1 C) 98.1 F (36.7 C) 98.6 F (37 C) 98.5 F (36.9 C)  TempSrc: Oral Oral Oral   SpO2: 97% 96% 97% 94%  Weight:      Height:       General exam: alert awake, oriented  HEENT:Oral mucosa moist  Respiratory system: Bilaterally clear BS,no use of accessory muscle Cardiovascular system: S1 & S2  Gastrointestinal system: Abdomen soft,NT,ND, BS+ Nervous System: Alert, awake, moving all extremities,and following commands. Extremities:warm, no edema Skin: No rashes,no icterus. MSK: Normal muscle bulk,tone, power  Data Reviewed:  There are no new results to review at this time.    Disposition: Status is: Inpatient Remains inpatient appropriate because:   Planned Discharge Destination: Home  Time spent: 32 minutes  Author: Volney Grumbles, MD 06/08/2024 2:26 PM  For on call review [email or web address].

## 2024-06-09 ENCOUNTER — Other Ambulatory Visit (HOSPITAL_COMMUNITY): Payer: Self-pay

## 2024-06-09 MED ORDER — OXYCODONE HCL ER 10 MG PO T12A: 10.0000 mg | EXTENDED_RELEASE_TABLET | Freq: Two times a day (BID) | ORAL | 0 refills | Status: DC

## 2024-06-09 MED ORDER — AMLODIPINE BESYLATE 5 MG PO TABS
5.0000 mg | ORAL_TABLET | Freq: Every day | ORAL | 0 refills | Status: DC
  Filled 2024-06-09: qty 30, 30d supply, fill #0

## 2024-06-09 MED ORDER — OXYCODONE HCL 5 MG PO TABS: 5.0000 mg | ORAL_TABLET | ORAL | 0 refills | Status: DC | PRN

## 2024-06-09 MED ORDER — OXYCODONE HCL 5 MG PO TABS
5.0000 mg | ORAL_TABLET | Freq: Once | ORAL | Status: AC
  Administered 2024-06-09: 5 mg via ORAL

## 2024-06-09 MED ORDER — RIVAROXABAN 10 MG PO TABS: 10.0000 mg | ORAL_TABLET | Freq: Every day | ORAL | 0 refills | Status: DC

## 2024-06-09 MED ORDER — DEXAMETHASONE 2 MG PO TABS: 2.0000 mg | ORAL_TABLET | Freq: Two times a day (BID) | ORAL | 0 refills | Status: DC

## 2024-06-09 MED ORDER — METOPROLOL SUCCINATE ER 50 MG PO TB24
50.0000 mg | ORAL_TABLET | Freq: Every day | ORAL | 0 refills | Status: DC
  Filled 2024-06-09: qty 30, 30d supply, fill #0

## 2024-06-09 NOTE — Progress Notes (Signed)
 TOC med in a secure bag delivered to pt's nurse by this RN

## 2024-06-09 NOTE — Discharge Summary (Signed)
 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)   Essential hypertension   AKI (acute kidney injury) (HCC)   Hypokalemia   Mass of upper lobe of right lung   Pathological fracture, pelvis, initial encounter for fracture   Pathologic fracture of lumbar vertebra, initial encounter   Goals of care, counseling/discussion   Hypophosphatemia   DNR (do not resuscitate)   Cancer associated pain   Need for emotional support   Counseling and coordination of care   Metastatic malignant neoplasm (HCC)   Lung mass   Palliative care encounter   Multiple pathological fractures   Medication management  Resolved Problems:   * No resolved hospital problems. [facility] Hospital Course: 63 y.o. 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  Patient follows with Dr. Gudena. Patient had outpatient labs performed revealing severe hypercalcemia patient was instructed to come to the [facility] Emergency Department for evaluation. In the ED:CT imaging of the chest abdomen and pelvis was performed revealing a right upper lobe mass measuring 5.8 x 5.7 cm in addition to diffuse bony metastatic disease throughout the abdomen and pelvis with a destructive soft tissue mass in the right L1 pedicle causing significant spinal canal and neuroforaminal narrowing.  Case was discussed with Dr. Larrie Po of neurosurgery who recommended  oncology and radiation oncology consultations for potential management.  Patient was initiated on intravenous fluids for the severe hypercalcemia and the hospitalist group was then called to assess the patient for admission to the hospital. Patient treated with IVF, bisphosphonate, calcitonin.   She's now s/p bronchoscopy.  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  She's not interested in treatment per discussion with her oncology (Dr. Lee Public) 6/2. At this time patient is waiting for skilled nursing facility>. P2P attempted on 6/6, 6/7-but [employer] office  was closed and completed 6/11.   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 S/p calcitonin and zometa  - ca better. Cytology from 5/29 bronch showing malignant cells most c/w metastatic breast carcinoma and malignant cells consistent with non small cell carcinoma Oncology discussed with the patient and she does not want to pursue any further cancer treatment > wants to consider rehabilitation.Radiation onc planning for radiation to T7, L1, and right rib metastases (first treatment 6/2). PTOT consult done pending SNF bed offers. P2P review attempted 6/6 and 6/7 and [facility] has been closed for the weekend>called the insurance company 6/9  and they have informed they will call back on 6/10, my phone number was  provided>I will ask TOC to follow-up. Hospice is being set up for return to mother's home w/ hospice. DC with paper scripts for her pain meds-Oxycodone  and oxycontin .  Hx Breast Cancer with Lung and Bone Mets Follows with Dr. Lee Public. Workup for malignancy  as noted above   AKI Hypokalemia Resolved   Hypomagnesemia : Continue Mag-Ox.     Pathologic  Pubic Rami Fractures: WBAT, cont Non op management per orth (5/28 note from Dorthy Gavia)   Hypertension: BP stable, continue Amlodipine , metoprolol     Leukocytosis Related to steroids    Constipation: Continue stool softeners bowel regimen.  Assessment and Plan: * Hypercalcemia of malignancy Severe hypercalcemia of malignancy, improving Thought to be secondary to either progressive metastatic breast cancer or right upper lobe mass concerning for primary lung malignancy. Calcitonin was discontinued on 5/29 Zometa  4 mg given on 5/27 Transitioning off of intravenous volume resuscitation on 5/31 Serial chemistries to monitor calcium  levels closely  Hypophosphatemia Phos 1.7 K-Phos x 3 doses initiated.  Goals of care, counseling/discussion Visit and goals of care discussions with patient and family daily Patient wishes to be DNR/DNI at this time Prognosis guarded/poor Patient is strongly considering hospice services Dr. Alejos Husband with Palliative care consulted to assist with facilitating these conversations, their assistance is appreciated.  Pathologic fracture of lumbar vertebra, initial encounter Patient continuing to have substantial back pain due to lytic lesions of the spine resulting in destructive mass identified in the right pedicle of L1, on the right pedicle of the L4 vertebral body as well as the L2 vertebral body Continue low-dose dexamethasone  to assist with inflammation and discomfort Opiate-based analgesics for pain control Offered IR consultation for possible kyphoplasty, patient has declined Dr. Lorri Rota with radiation oncology consulted who plans on initiating radiation therapy 6/2  Eventual plan for skilled nursing facility placement this week once bed available and authorization obtained.  Pathological fracture, pelvis, initial encounter for fracture CT imaging revealing fracture of the left superior pubic ramus at the pubic acetabular junction as well as a large  destructive mass filling the inferior pubic left pubic ramus measuring 3.5 cm Patient evaluated by orthopedic surgery on 5/28, supportive care recommended, their input is appreciated.  Mass of upper lobe of right lung Patient underwent bronchoscopy with EBUS 5/29 with biopsies obtained, awaiting results Results may not be available prior to discharge. seen on CT imaging measuring 5.8 x 5.7 cm  concern for primary lung malignancy PCCM following daily, their input is appreciated.    Hypokalemia Recurrent Replacing with potassium chloride  Evaluating for concurrent hypomagnesemia  Monitoring potassium levels with serial chemistries.   AKI (acute kidney injury) (HCC) Creatinine seems to have now stabilized. substantial acute kidney injury secondary to volume depletion and hypercalcemia  Patient has been transitioned off of intravenous fluids. Strict input and output monitoring Minimizing use of nephrotoxic agents  Essential hypertension Continue amlodipine , metoprolol  Blood pressures well-controlled As needed intravenous hydralazine  for markedly elevated blood pressures.  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 Lee Public Patient has been on letrozole  since April 2023, continuing Significant metastatic disease of the spine and pelvis, oncology to coordinate with radiation oncology for potential radiation therapy         Disposition: Hospice care Diet recommendation:  Regular diet DISCHARGE MEDICATION: Allergies as of 06/09/2024       Reactions   Hydrochlorothiazide Other (See Comments)   Unknown         Medication List     STOP taking these medications    amoxicillin -clavulanate 875-125 MG tablet Commonly known as: AUGMENTIN    aspirin  81 MG chewable tablet   atorvastatin  10 MG tablet Commonly  known as:  Lipitor   HAIR/SKIN/NAILS PO   losartan  100 MG tablet Commonly known as: COZAAR    milk thistle 175 MG tablet   naproxen sodium 220 MG tablet Commonly known as: ALEVE   predniSONE  10 MG (21) Tbpk tablet Commonly known as: STERAPRED UNI-PAK 21 TAB   traMADol  50 MG tablet Commonly known as: ULTRAM        TAKE these medications    amLODipine  5 MG tablet Commonly known as: NORVASC  Take 1 tablet (5 mg total) by mouth daily. Start taking on: June 10, 2024   clotrimazole  10 MG troche Commonly known as: MYCELEX  Take 1 tablet (10 mg total) by mouth five times a day while on immunosuppressive medicine   cyclobenzaprine  5 MG tablet Commonly known as: FLEXERIL  Take 1 tablet (5 mg total) by mouth at bedtime as needed for muscle spasms. What changed: when to take this   dexamethasone  2 MG tablet Commonly known as: DECADRON  Take 1 tablet (2 mg total) by mouth 2 (two) times daily.   diphenhydrAMINE  25 mg capsule Commonly known as: BENADRYL  Take 25 mg by mouth at bedtime.   fluticasone  50 MCG/ACT nasal spray Commonly known as: FLONASE  Place 2 sprays into both nostrils daily. What changed:  how much to take when to take this reasons to take this   metoprolol  succinate 50 MG 24 hr tablet Commonly known as: TOPROL -XL Take with or immediately following a meal. Start taking on: June 10, 2024 What changed:  medication strength how much to take how to take this when to take this additional instructions   oxyCODONE  10 mg 12 hr tablet Commonly known as: OXYCONTIN  Take 1 tablet (10 mg total) by mouth every 12 (twelve) hours.   oxyCODONE  5 MG immediate release tablet Commonly known as: Oxy IR/ROXICODONE  Take 1 tablet (5 mg total) by mouth every 4 (four) hours as needed for moderate pain (pain score 4-6).   rivaroxaban  10 MG Tabs tablet Commonly known as: XARELTO  Take 1 tablet (10 mg total) by mouth at bedtime.   tiZANidine  4 MG tablet Commonly known as: ZANAFLEX  Take 1  tablet (4 mg total) by mouth at bedtime as needed. What changed:  when to take this reasons to take this        Discharge Exam: Filed Weights   05/22/24 2300  Weight: 68 kg   General exam: alert awake, oriented  HEENT:Oral mucosa moist  Respiratory system: Bilaterally clear BS,no use of accessory muscle Cardiovascular system: S1 & S2  Gastrointestinal system: Abdomen soft,NT,ND, BS+ Nervous System: Alert, awake, moving all extremities,and following commands. Extremities:warm, no edema Skin: No rashes,no icterus. MSK: Normal muscle bulk,tone, power      Condition at discharge: good  The results of significant diagnostics from this hospitalization (including imaging, microbiology, ancillary and laboratory) are listed below for reference.   Imaging Studies: DG Lumbar Spine 2-3 Views Result Date: 05/25/2024 CLINICAL DATA:  Back pain EXAM: LUMBAR SPINE - 2-3 VIEW COMPARISON:  CT 05/22/2024 FINDINGS: Grade 1 anterolisthesis L4 on L5. Vertebral body heights are maintained. Multilevel degenerative changes with moderate disc space narrowing at L1-L2, L2-L3, L3-L4 and L5-S1. Multilevel degenerative osteophytes and facet degenerative changes. Faintly visible lytic lesion at L2 but better seen on CT. Other known lytic lesions are better seen on CT. IMPRESSION: Multilevel degenerative changes. Grade 1 anterolisthesis L4 on L5. Faintly visible lytic lesion at L2 but better seen on CT as are other known lytic lesions. Electronically Signed   By: Ulyess Gammons.D.  On: 05/25/2024 16:39   DG Pelvis Comp Min 3V Result Date: 05/25/2024 CLINICAL DATA:  Right-sided hip pain EXAM: JUDET PELVIS - 3+ VIEW COMPARISON:  CT 05/22/2024, radiograph 12/08/2018 FINDINGS: SI joints are non widened. There are bilateral hip replacements with normal alignment. Age indeterminate left superior pubic ramus fracture. Lytic/destructive lesion left inferior pubic ramus. Other scattered lucent lesions on CT are not well  seen radiographically. IMPRESSION: 1. Age indeterminate left superior pubic ramus fracture. 2. Lytic/destructive lesion left inferior pubic ramus. Other scattered lucent lesions on CT are not well seen radiographically. Electronically Signed   By: Esmeralda Hedge M.D.   On: 05/25/2024 16:36   DG Chest Port 1 View Result Date: 05/24/2024 CLINICAL DATA:  Status post bronchoscopy EXAM: PORTABLE CHEST 1 VIEW COMPARISON:  Chest CT 05/22/2024 FINDINGS: Large mass again noted in the right upper lobe measuring approximately 5.6 cm. Probable adjacent postobstructive atelectasis or pneumonia in the right upper lobe. No pneumothorax following bronchoscopy. Left lung clear. Heart is normal size. Soft tissue prominence in the right paratracheal region likely corresponding to previously seen right paratracheal adenopathy. No effusions. IMPRESSION: Right upper lobe mass. Adjacent airspace disease could reflect postobstructive atelectasis or pneumonia. No pneumothorax following bronchoscopy. Electronically Signed   By: Janeece Mechanic M.D.   On: 05/24/2024 18:18   DG C-ARM BRONCHOSCOPY Result Date: 05/24/2024 C-ARM BRONCHOSCOPY: Fluoroscopy was utilized by the requesting physician.  No radiographic interpretation.   ECHOCARDIOGRAM COMPLETE Result Date: 05/23/2024    ECHOCARDIOGRAM REPORT   Patient Name:   [facility] SCARLET Stumpp Date of Exam: 05/23/2024 Medical Rec #:  [id number]              Height:       65.0 in Accession #:    [id number]             Weight:       149.9 lb Date of Birth:  June 03, 1961              BSA:          1.750 m Patient Age:    63 years               BP:           139/76 mmHg Patient Gender: F                      HR:           100 bpm. Exam Location:  Inpatient Procedure: 2D Echo, Cardiac Doppler and Color Doppler (Both Spectral and Color            Flow Doppler were utilized during procedure). Indications:    R06.02 SOB  History:        Patient has no prior history of Echocardiogram examinations.                  Breast cancer.  Sonographer:    Raynelle Callow RDCS Referring Phys: [phone] [facility]  Sonographer Comments: Technically difficult study due to poor echo windows. Patient in pain, supine. IMPRESSIONS  1. Left ventricular ejection fraction, by estimation, is 60 to 65%. The left ventricle has normal function. The left ventricle has no regional wall motion abnormalities. Left ventricular diastolic parameters are consistent with Grade I diastolic dysfunction (impaired relaxation).  2. Right ventricular systolic function is normal. The right ventricular size is normal. Tricuspid regurgitation signal is inadequate for assessing PA pressure. The estimated right ventricular systolic pressure is 9.7 mmHg.  3. The mitral valve is grossly normal. Trivial mitral valve regurgitation. No evidence of mitral stenosis.  4. The aortic valve is grossly normal. Aortic valve regurgitation is not visualized. No aortic stenosis is present.  5. The inferior vena cava is normal in size with greater than 50% respiratory variability, suggesting right atrial pressure of 3 mmHg. FINDINGS  Left Ventricle: Left ventricular ejection fraction, by estimation, is 60 to 65%. The left ventricle has normal function. The left ventricle has no regional wall motion abnormalities. The left ventricular internal cavity size was normal in size. There is  no left ventricular hypertrophy. Left ventricular diastolic parameters are consistent with Grade I diastolic dysfunction (impaired relaxation). Normal left ventricular filling pressure. Right Ventricle: The right ventricular size is normal. No increase in right ventricular wall thickness. Right ventricular systolic function is normal. Tricuspid regurgitation signal is inadequate for assessing PA pressure. The tricuspid regurgitant velocity is 1.29 m/s, and with an assumed right atrial pressure of 3 mmHg, the estimated right ventricular systolic pressure is 9.7 mmHg. Left Atrium: Left atrial size was  normal in size. Right Atrium: Right atrial size was normal in size. Pericardium: There is no evidence of pericardial effusion. Mitral Valve: The mitral valve is grossly normal. Trivial mitral valve regurgitation. No evidence of mitral valve stenosis. Tricuspid Valve: The tricuspid valve is not well visualized. Tricuspid valve regurgitation is trivial. No evidence of tricuspid stenosis. Aortic Valve: The aortic valve is grossly normal. Aortic valve regurgitation is not visualized. No aortic stenosis is present. Pulmonic Valve: The pulmonic valve was not well visualized. Pulmonic valve regurgitation is not visualized. No evidence of pulmonic stenosis. Aorta: The aortic root and ascending aorta are structurally normal, with no evidence of dilitation. Venous: The inferior vena cava is normal in size with greater than 50% respiratory variability, suggesting right atrial pressure of 3 mmHg. IAS/Shunts: The atrial septum is grossly normal.  LEFT VENTRICLE PLAX 2D LVIDd:         3.90 cm     Diastology LVIDs:         2.70 cm     LV e' medial:    8.49 cm/s LV PW:         0.90 cm     LV E/e' medial:  7.8 LV IVS:        0.90 cm     LV e' lateral:   9.79 cm/s LVOT diam:     2.10 cm     LV E/e' lateral: 6.7 LV SV:         70 LV SV Index:   40 LVOT Area:     3.46 cm  LV Volumes (MOD) LV vol d, MOD A2C: 44.3 ml LV vol d, MOD A4C: 44.0 ml LV vol s, MOD A2C: 15.6 ml LV vol s, MOD A4C: 16.1 ml LV SV MOD A2C:     28.7 ml LV SV MOD A4C:     44.0 ml LV SV MOD BP:      29.3 ml RIGHT VENTRICLE             IVC RV S prime:     17.50 cm/s  IVC diam: 1.40 cm TAPSE (M-mode): 2.8 cm LEFT ATRIUM           Index        RIGHT ATRIUM           Index LA diam:      3.00 cm 1.71 cm/m   RA Area:  11.30 cm LA Vol (A2C): 12.6 ml 7.20 ml/m   RA Volume:   24.00 ml  13.71 ml/m LA Vol (A4C): 31.4 ml 17.94 ml/m  AORTIC VALVE LVOT Vmax:   119.00 cm/s LVOT Vmean:  79.300 cm/s LVOT VTI:    0.202 m  AORTA Ao Root diam: 3.20 cm Ao Asc diam:  2.70 cm MITRAL  VALVE                TRICUSPID VALVE MV Area (PHT): 3.61 cm     TR Peak grad:   6.7 mmHg MV Decel Time: 210 msec     TR Vmax:        129.00 cm/s MV E velocity: 65.80 cm/s MV A velocity: 102.60 cm/s  SHUNTS MV E/A ratio:  0.64         Systemic VTI:  0.20 m                             Systemic Diam: 2.10 cm Maudine Sos MD Electronically signed by Maudine Sos MD Signature Date/Time: 05/23/2024/5:14:59 PM    Final    CT Renal Stone Study Result Date: 05/22/2024 CLINICAL DATA:  R flank pain, recent cancer diagnosis, aki EXAM: CT ABDOMEN AND PELVIS WITHOUT CONTRAST TECHNIQUE: Multidetector CT imaging of the abdomen and pelvis was performed following the standard protocol without IV contrast. RADIATION DOSE REDUCTION: This exam was performed according to the departmental dose-optimization program which includes automated exposure control, adjustment of the mA and/or kV according to patient size and/or use of iterative reconstruction technique. COMPARISON:  None Available. FINDINGS: Of note, the lack of intravenous contrast limits evaluation of the solid organ parenchyma and vascularity. Lower chest: For findings above the diaphragm, please see the separately dictated CT of the chest report, which was performed concurrently. Hepatobiliary: Subcentimeter hypodensity in the right hepatic lobe measuring 5 mm (axial 9). Left hepatic lobe hypodensity measuring 9 mm (axial 9). Segment 4 hypodensity measuring 1.1 cm (axial 17). 1.3 cm hypodensity abutting the gallbladder fossa in the right hepatic lobe inferiorly (axial 27).Focal fatty infiltration along the falciform ligament.Layering biliary sludge or punctate gallstones. No gallbladder wall thickening. No intrahepatic or extrahepatic biliary ductal dilation. Pancreas: No mass or main ductal dilation. No peripancreatic inflammation or fluid collection. Spleen: Normal size. No mass. Adrenals/Urinary Tract: No adrenal mass. Large cyst in the left upper pole. No  hydronephrosis or nephrolithiasis. The urinary bladder is obscured by metallic streak artifact by both hip arthroplasties. Stomach/Bowel:Likely fluid-filled diverticulum arising from the posterior stomach (axial 11). The stomach contains ingested material without focal abnormality. No small bowel wall thickening or inflammation. No small bowel obstruction.Normal appendix. Vascular/Lymphatic: No aortic aneurysm. Diffuse aortoiliac atherosclerosis. No intraabdominal or pelvic lymphadenopathy. Reproductive: The pelvic organs are obscured by metallic streak artifact from both hip arthroplasties. No free pelvic fluid. Other: No pneumoperitoneum, ascites, or mesenteric inflammation. Musculoskeletal: Destructive soft tissue mass in the right pedicle of L1, measuring 3.5 cm, encroaching into the neural foramen and causing significant spinal canal narrowing (axial 23). A smaller destructive mass in the right pedicle of the L4 vertebral body measures 1.7 cm, encroaching into the right neural foramen and the lateral right spinal canal (axial 39). 2.7 cm lytic lesion in the right L2 vertebral body. Both hip arthroplasties are anatomically aligned without dislocation. Age-indeterminate, fracture of the left superior pubic ramus at the puboacetabular junction. Large destructive mass filling the inferior left pubic ramus measuring 3.5 cm. IMPRESSION: 1.  Multiple small hypodensities in the liver measuring up to 1.3 cm in the inferior right hepatic lobe (axial 27), worrisome for metastatic disease. 2. Diffuse bony metastatic disease throughout the abdomen and pelvis. For example, there is a destructive soft tissue mass in the right L1 pedicle, measuring 3.5 cm, causing significant spinal canal and neuroforaminal narrowing (axial 23). 3. Age-indeterminate, but favored to be subacute, fracture of the left superior pubic ramus at the puboacetabular junction. Large destructive mass filling the inferior left pubic ramus, measuring 3.5  cm. Electronically Signed   By: Rance Burrows M.D.   On: 05/22/2024 19:26   CT CHEST WO CONTRAST Result Date: 05/22/2024 CLINICAL DATA:  Mass of upper lobe of right lung. Abnormal x-ray. History of left-sided breast cancer. Current smoker. EXAM: CT CHEST WITHOUT CONTRAST TECHNIQUE: Multidetector CT imaging of the chest was performed following the standard protocol without IV contrast. RADIATION DOSE REDUCTION: This exam was performed according to the departmental dose-optimization program which includes automated exposure control, adjustment of the mA and/or kV according to patient size and/or use of iterative reconstruction technique. COMPARISON:  Chest radiograph 02/17/2024 FINDINGS: Cardiovascular: The heart is normal in size. There are coronary artery calcifications. No pericardial effusion. Moderate aortic atherosclerosis. No aortic aneurysm. Mediastinum/Nodes: Pathologic mediastinal adenopathy including a 19 mm short axis right paratracheal node series 2, image 54. There additional prominent mediastinal lymph nodes. Hilar assessment is limited in the absence of IV contrast. Unremarkable appearance of the esophagus. Lungs/Pleura: Right upper lobe mass measures 5.8 x 5.7 cm, series 3, image 76. This causes bowing of the minor fissure. Centrally this lesion abuts the mediastinal fat with loss of fat plane. This is increased in size from prior chest radiograph. 3 mm left upper lobe nodule series 3, image 39. Mild emphysema. Mild subpleural reticulation within the dependent lower lobes. Mild retained secretions in the central airways. No pleural effusion or thickening. Upper Abdomen: Posterior gastric diverticulum. No adrenal nodule. 6.3 cm fluid density lesion arising from the lateral left kidney is likely cyst, although incompletely included in the field of view. The liver appears mildly heterogeneous but no discrete lesion is seen. Musculoskeletal: There are multiple lytic lesions throughout the ribs and  spine consistent with osseous metastatic disease. Lytic lesion within T7 has associated mild pathologic compression fracture as well as loss of posterior cortical margin and possible extraosseous soft tissue component. There are lesions throughout multiple thoracic vertebra, posterior elements and many ribs. Fractures of lateral lower left ribs may be in part pathologic, and are likely subacute with surrounding callus formation. The largest rib lesion involves posterior left ninth rib, expansile with soft tissue component. Bilateral scapular lucent lesions with a fracture involving the inferior right scapular body. Surgical clips in the left breast and axilla. IMPRESSION: 1. A 5.8 cm right upper lobe mass, increased in size from prior chest radiograph, highly suspicious for primary lung malignancy. 2. Pathologic mediastinal adenopathy, typical of metastatic disease. 3. 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 There is a mild pathologic compression fracture of T7. Soft tissue component with loss of posterior cortex and possible extraosseous soft tissue component. Recommend thoracic spine MRI to assess spinal canal patency. Fractures of lateral lower left ribs may be in part pathologic, and are likely subacute with surrounding callus formation. There is a fracture involving the inferior right scapular body. 4. Coronary artery calcifications. Aortic Atherosclerosis (ICD10-I70.0) and Emphysema (ICD10-J43.9). These results will be called to the ordering clinician or representative by the Radiologist  Geophysicist/field seismologist, and communication documented in the PACS or [employer]. Electronically Signed   By: Chadwick Colonel M.D.   On: 05/22/2024 15:10    Microbiology: Results for orders placed or performed during the hospital encounter of 12/04/18  Surgical pcr screen     Status: None   Collection Time: 12/04/18  9:53 AM   Specimen: Nasal Mucosa; Nasal Swab  Result Value Ref Range  Status   MRSA, PCR NEGATIVE NEGATIVE Final   Staphylococcus aureus NEGATIVE NEGATIVE Final    Comment: (NOTE) The Xpert SA Assay (FDA approved for NASAL specimens in patients 6 years of age and older), is one component of a comprehensive surveillance program. It is not intended to diagnose infection nor to guide or monitor treatment. Performed at [facility], [address] [place], [place] [address]     Labs: CBC: No results for input(s): WBC, NEUTROABS, HGB, HCT, MCV, PLT in the last 168 hours. Basic Metabolic Panel: No results for input(s): NA, K, CL, CO2, GLUCOSE, BUN, CREATININE, CALCIUM , MG, PHOS in the last 168 hours. Liver Function Tests: No results for input(s): AST, ALT, ALKPHOS, BILITOT, PROT, ALBUMIN in the last 168 hours. CBG: No results for input(s): GLUCAP in the last 168 hours.  Discharge time spent: greater than 30 minutes.  Signed: Volney Grumbles, MD [facility] 06/09/2024

## 2024-06-11 ENCOUNTER — Ambulatory Visit

## 2024-06-11 ENCOUNTER — Other Ambulatory Visit (HOSPITAL_COMMUNITY): Payer: Self-pay

## 2024-06-11 MED ORDER — OXYCODONE HCL 5 MG PO TABS
5.0000 mg | ORAL_TABLET | ORAL | 0 refills | Status: DC | PRN
  Filled 2024-06-11 – 2024-06-12 (×2): qty 60, 10d supply, fill #0

## 2024-06-11 MED ORDER — DEXAMETHASONE 2 MG PO TABS
2.0000 mg | ORAL_TABLET | Freq: Two times a day (BID) | ORAL | 3 refills | Status: DC
  Filled 2024-06-11: qty 30, 15d supply, fill #0

## 2024-06-12 ENCOUNTER — Other Ambulatory Visit (HOSPITAL_COMMUNITY): Payer: Self-pay

## 2024-06-13 ENCOUNTER — Encounter: Admitting: Rehabilitative and Restorative Service Providers"

## 2024-06-18 DIAGNOSIS — R5383 Other fatigue: Secondary | ICD-10-CM | POA: Diagnosis not present

## 2024-06-19 ENCOUNTER — Encounter: Admitting: Rehabilitative and Restorative Service Providers"

## 2024-06-21 ENCOUNTER — Encounter: Admitting: Rehabilitative and Restorative Service Providers"

## 2024-06-26 DEATH — deceased

## 2024-07-02 ENCOUNTER — Encounter

## 2024-08-14 ENCOUNTER — Ambulatory Visit: Admitting: Physician Assistant
